# Patient Record
Sex: Male | Born: 1945
Health system: Southern US, Community
[De-identification: ages and names within clinical notes are randomized; demographics above are authoritative.]

## PROBLEM LIST (undated history)

## (undated) DIAGNOSIS — K219 Gastro-esophageal reflux disease without esophagitis: Secondary | ICD-10-CM

## (undated) DIAGNOSIS — N329 Bladder disorder, unspecified: Secondary | ICD-10-CM

## (undated) DIAGNOSIS — E119 Type 2 diabetes mellitus without complications: Secondary | ICD-10-CM

## (undated) DIAGNOSIS — Z973 Presence of spectacles and contact lenses: Secondary | ICD-10-CM

## (undated) DIAGNOSIS — Z8719 Personal history of other diseases of the digestive system: Secondary | ICD-10-CM

## (undated) DIAGNOSIS — M1990 Unspecified osteoarthritis, unspecified site: Secondary | ICD-10-CM

## (undated) DIAGNOSIS — R319 Hematuria, unspecified: Secondary | ICD-10-CM

## (undated) DIAGNOSIS — Z9289 Personal history of other medical treatment: Secondary | ICD-10-CM

## (undated) DIAGNOSIS — I10 Essential (primary) hypertension: Secondary | ICD-10-CM

## (undated) DIAGNOSIS — C801 Malignant (primary) neoplasm, unspecified: Secondary | ICD-10-CM

## (undated) DIAGNOSIS — D649 Anemia, unspecified: Secondary | ICD-10-CM

## (undated) HISTORY — PX: HERNIA REPAIR: SHX51

---

## 2011-03-12 DIAGNOSIS — I10 Essential (primary) hypertension: Secondary | ICD-10-CM | POA: Diagnosis not present

## 2011-03-12 DIAGNOSIS — E119 Type 2 diabetes mellitus without complications: Secondary | ICD-10-CM | POA: Diagnosis not present

## 2011-03-12 DIAGNOSIS — E782 Mixed hyperlipidemia: Secondary | ICD-10-CM | POA: Diagnosis not present

## 2011-03-12 DIAGNOSIS — N400 Enlarged prostate without lower urinary tract symptoms: Secondary | ICD-10-CM | POA: Diagnosis not present

## 2011-03-12 DIAGNOSIS — N529 Male erectile dysfunction, unspecified: Secondary | ICD-10-CM | POA: Diagnosis not present

## 2011-03-16 DIAGNOSIS — R972 Elevated prostate specific antigen [PSA]: Secondary | ICD-10-CM | POA: Diagnosis not present

## 2011-03-16 DIAGNOSIS — E119 Type 2 diabetes mellitus without complications: Secondary | ICD-10-CM | POA: Diagnosis not present

## 2011-03-16 DIAGNOSIS — I10 Essential (primary) hypertension: Secondary | ICD-10-CM | POA: Diagnosis not present

## 2011-03-16 DIAGNOSIS — E782 Mixed hyperlipidemia: Secondary | ICD-10-CM | POA: Diagnosis not present

## 2011-03-16 DIAGNOSIS — N529 Male erectile dysfunction, unspecified: Secondary | ICD-10-CM | POA: Diagnosis not present

## 2011-03-30 DIAGNOSIS — N529 Male erectile dysfunction, unspecified: Secondary | ICD-10-CM | POA: Diagnosis not present

## 2011-03-30 DIAGNOSIS — R972 Elevated prostate specific antigen [PSA]: Secondary | ICD-10-CM | POA: Diagnosis not present

## 2011-04-27 DIAGNOSIS — N529 Male erectile dysfunction, unspecified: Secondary | ICD-10-CM | POA: Diagnosis not present

## 2011-05-28 DIAGNOSIS — R972 Elevated prostate specific antigen [PSA]: Secondary | ICD-10-CM | POA: Diagnosis not present

## 2011-05-28 DIAGNOSIS — N529 Male erectile dysfunction, unspecified: Secondary | ICD-10-CM | POA: Diagnosis not present

## 2011-06-27 DIAGNOSIS — N529 Male erectile dysfunction, unspecified: Secondary | ICD-10-CM | POA: Diagnosis not present

## 2011-08-23 DIAGNOSIS — N529 Male erectile dysfunction, unspecified: Secondary | ICD-10-CM | POA: Diagnosis not present

## 2011-08-27 DIAGNOSIS — E119 Type 2 diabetes mellitus without complications: Secondary | ICD-10-CM | POA: Diagnosis not present

## 2011-08-27 DIAGNOSIS — N529 Male erectile dysfunction, unspecified: Secondary | ICD-10-CM | POA: Diagnosis not present

## 2011-08-27 DIAGNOSIS — I10 Essential (primary) hypertension: Secondary | ICD-10-CM | POA: Diagnosis not present

## 2011-08-27 DIAGNOSIS — E782 Mixed hyperlipidemia: Secondary | ICD-10-CM | POA: Diagnosis not present

## 2011-08-27 DIAGNOSIS — N400 Enlarged prostate without lower urinary tract symptoms: Secondary | ICD-10-CM | POA: Diagnosis not present

## 2011-09-03 DIAGNOSIS — N529 Male erectile dysfunction, unspecified: Secondary | ICD-10-CM | POA: Diagnosis not present

## 2011-09-03 DIAGNOSIS — E119 Type 2 diabetes mellitus without complications: Secondary | ICD-10-CM | POA: Diagnosis not present

## 2011-09-03 DIAGNOSIS — I10 Essential (primary) hypertension: Secondary | ICD-10-CM | POA: Diagnosis not present

## 2011-09-03 DIAGNOSIS — K5730 Diverticulosis of large intestine without perforation or abscess without bleeding: Secondary | ICD-10-CM | POA: Diagnosis not present

## 2011-09-03 DIAGNOSIS — R972 Elevated prostate specific antigen [PSA]: Secondary | ICD-10-CM | POA: Diagnosis not present

## 2011-09-03 DIAGNOSIS — E782 Mixed hyperlipidemia: Secondary | ICD-10-CM | POA: Diagnosis not present

## 2011-10-10 DIAGNOSIS — L259 Unspecified contact dermatitis, unspecified cause: Secondary | ICD-10-CM | POA: Diagnosis not present

## 2012-03-04 DIAGNOSIS — E7800 Pure hypercholesterolemia, unspecified: Secondary | ICD-10-CM | POA: Diagnosis not present

## 2012-03-04 DIAGNOSIS — E119 Type 2 diabetes mellitus without complications: Secondary | ICD-10-CM | POA: Diagnosis not present

## 2012-03-04 DIAGNOSIS — N529 Male erectile dysfunction, unspecified: Secondary | ICD-10-CM | POA: Diagnosis not present

## 2012-03-04 DIAGNOSIS — N400 Enlarged prostate without lower urinary tract symptoms: Secondary | ICD-10-CM | POA: Diagnosis not present

## 2012-03-04 DIAGNOSIS — I10 Essential (primary) hypertension: Secondary | ICD-10-CM | POA: Diagnosis not present

## 2012-04-25 ENCOUNTER — Other Ambulatory Visit: Payer: Self-pay | Admitting: Urology

## 2012-04-25 ENCOUNTER — Ambulatory Visit (INDEPENDENT_AMBULATORY_CARE_PROVIDER_SITE_OTHER): Payer: Medicare Other | Admitting: Urology

## 2012-04-25 DIAGNOSIS — R310 Gross hematuria: Secondary | ICD-10-CM

## 2012-04-25 DIAGNOSIS — R3915 Urgency of urination: Secondary | ICD-10-CM

## 2012-04-25 DIAGNOSIS — R972 Elevated prostate specific antigen [PSA]: Secondary | ICD-10-CM | POA: Diagnosis not present

## 2012-04-30 ENCOUNTER — Ambulatory Visit (HOSPITAL_COMMUNITY)
Admission: RE | Admit: 2012-04-30 | Discharge: 2012-04-30 | Disposition: A | Discharge status: Home / Self Care | Payer: Medicare Other | Source: Ambulatory Visit | Attending: Urology | Admitting: Urology

## 2012-04-30 DIAGNOSIS — E119 Type 2 diabetes mellitus without complications: Secondary | ICD-10-CM | POA: Insufficient documentation

## 2012-04-30 DIAGNOSIS — R310 Gross hematuria: Secondary | ICD-10-CM

## 2012-04-30 LAB — POCT I-STAT, CHEM 8
BUN: 21 mg/dL (ref 6–23)
Calcium, Ion: 1.13 mmol/L (ref 1.13–1.30)
Chloride: 105 mEq/L (ref 96–112)
Creatinine, Ser: 1.1 mg/dL (ref 0.50–1.35)
Sodium: 135 mEq/L (ref 135–145)
TCO2: 28 mmol/L (ref 0–100)

## 2012-04-30 MED ORDER — IOHEXOL 300 MG/ML  SOLN
150.0000 mL | Freq: Once | INTRAMUSCULAR | Status: AC | PRN
  Administered 2012-04-30: 125 mL via INTRAVENOUS

## 2012-04-30 NOTE — Progress Notes (Signed)
Blood sample obtained from left arm IV for Creatnine level.  

## 2012-06-04 DIAGNOSIS — R310 Gross hematuria: Secondary | ICD-10-CM | POA: Diagnosis not present

## 2012-06-04 DIAGNOSIS — R82998 Other abnormal findings in urine: Secondary | ICD-10-CM | POA: Diagnosis not present

## 2012-06-04 DIAGNOSIS — R972 Elevated prostate specific antigen [PSA]: Secondary | ICD-10-CM | POA: Diagnosis not present

## 2012-06-05 ENCOUNTER — Other Ambulatory Visit: Payer: Self-pay | Admitting: Urology

## 2012-06-05 ENCOUNTER — Encounter (HOSPITAL_BASED_OUTPATIENT_CLINIC_OR_DEPARTMENT_OTHER): Payer: Self-pay | Admitting: *Deleted

## 2012-06-10 ENCOUNTER — Other Ambulatory Visit: Payer: Self-pay | Admitting: Urology

## 2012-06-10 ENCOUNTER — Encounter (HOSPITAL_COMMUNITY): Payer: Self-pay | Admitting: Pharmacy Technician

## 2012-06-10 NOTE — Patient Instructions (Addendum)
20 Izrael Peak  06/10/2012   Your procedure is scheduled on:  06/12/12  THURSDAY  Report to [facility] at  0530     AM.  Call this number if you have problems the morning of surgery: [phone]       Remember:   Do not eat food  Or drink :After Midnight. TONIGHT   Take these medicines the morning of surgery with A SIP OF WATER:  NONE   .  Contacts, dentures or partial plates can not be worn to surgery  Leave suitcase in the car. After surgery it may be brought to your room.  For patients admitted to the hospital, checkout time is 11:00 AM day of  discharge.             SPECIAL INSTRUCTIONS- SEE [place] PREPARING FOR SURGERY INSTRUCTION SHEET-     DO NOT WEAR JEWELRY, LOTIONS, POWDERS, OR PERFUMES.  WOMEN-- DO NOT SHAVE LEGS OR UNDERARMS FOR 12 HOURS BEFORE SHOWERS. MEN MAY SHAVE FACE.  Patients discharged the day of surgery will not be allowed to drive home. IF going home the day of surgery, you must have a driver and someone to stay with you for the first 24 hours  Name and phone number of your driver:     Wife  Or friend                                                                   Please read over the following fact sheets that you were given: MRSA Information, Incentive Spirometry Sheet, Blood Transfusion Sheet  Information                                                                                   Jenny Lai  PST 336  [phone]                 FAILURE TO FOLLOW THESE INSTRUCTIONS MAY RESULT IN  CANCELLATION   OF YOUR SURGERY                                                  Patient Signature _____________________________

## 2012-06-11 ENCOUNTER — Ambulatory Visit (HOSPITAL_COMMUNITY)
Admission: RE | Admit: 2012-06-11 | Discharge: 2012-06-11 | Disposition: A | Discharge status: Home / Self Care | Payer: Medicare Other | Source: Ambulatory Visit | Attending: Urology | Admitting: Urology

## 2012-06-11 ENCOUNTER — Encounter (HOSPITAL_COMMUNITY)
Admission: RE | Admit: 2012-06-11 | Discharge: 2012-06-11 | Disposition: A | Discharge status: Home / Self Care | Payer: Medicare Other | Source: Ambulatory Visit | Attending: Urology | Admitting: Urology

## 2012-06-11 ENCOUNTER — Encounter (HOSPITAL_COMMUNITY): Payer: Self-pay

## 2012-06-11 DIAGNOSIS — N3020 Other chronic cystitis without hematuria: Secondary | ICD-10-CM | POA: Diagnosis not present

## 2012-06-11 DIAGNOSIS — E7800 Pure hypercholesterolemia, unspecified: Secondary | ICD-10-CM | POA: Diagnosis not present

## 2012-06-11 DIAGNOSIS — Z01818 Encounter for other preprocedural examination: Secondary | ICD-10-CM | POA: Diagnosis not present

## 2012-06-11 DIAGNOSIS — I10 Essential (primary) hypertension: Secondary | ICD-10-CM | POA: Diagnosis not present

## 2012-06-11 DIAGNOSIS — E119 Type 2 diabetes mellitus without complications: Secondary | ICD-10-CM | POA: Diagnosis not present

## 2012-06-11 DIAGNOSIS — Z87891 Personal history of nicotine dependence: Secondary | ICD-10-CM | POA: Diagnosis not present

## 2012-06-11 DIAGNOSIS — N529 Male erectile dysfunction, unspecified: Secondary | ICD-10-CM | POA: Diagnosis not present

## 2012-06-11 DIAGNOSIS — N329 Bladder disorder, unspecified: Secondary | ICD-10-CM | POA: Diagnosis not present

## 2012-06-11 DIAGNOSIS — Z79899 Other long term (current) drug therapy: Secondary | ICD-10-CM | POA: Diagnosis not present

## 2012-06-11 DIAGNOSIS — Z7982 Long term (current) use of aspirin: Secondary | ICD-10-CM | POA: Diagnosis not present

## 2012-06-11 DIAGNOSIS — C61 Malignant neoplasm of prostate: Secondary | ICD-10-CM | POA: Diagnosis not present

## 2012-06-11 DIAGNOSIS — Z8711 Personal history of peptic ulcer disease: Secondary | ICD-10-CM | POA: Diagnosis not present

## 2012-06-11 HISTORY — DX: Hematuria, unspecified: R31.9

## 2012-06-11 HISTORY — DX: Anemia, unspecified: D64.9

## 2012-06-11 HISTORY — DX: Type 2 diabetes mellitus without complications: E11.9

## 2012-06-11 HISTORY — DX: Essential (primary) hypertension: I10

## 2012-06-11 HISTORY — DX: Personal history of other diseases of the digestive system: Z87.19

## 2012-06-11 HISTORY — DX: Gastro-esophageal reflux disease without esophagitis: K21.9

## 2012-06-11 HISTORY — DX: Personal history of other medical treatment: Z92.89

## 2012-06-11 LAB — CBC
HCT: 45.3 % (ref 39.0–52.0)
Hemoglobin: 14.3 g/dL (ref 13.0–17.0)
MCH: 26.9 pg (ref 26.0–34.0)
MCHC: 31.6 g/dL (ref 30.0–36.0)
MCV: 85.3 fL (ref 78.0–100.0)
Platelets: 207 10*3/uL (ref 150–400)
RBC: 5.31 MIL/uL (ref 4.22–5.81)
RDW: 13.5 % (ref 11.5–15.5)
WBC: 4.8 10*3/uL (ref 4.0–10.5)

## 2012-06-11 LAB — BASIC METABOLIC PANEL
GFR calc Af Amer: >90 mL/min (ref >90)
GFR calc non Af Amer: 84 mL/min — ABNORMAL LOW (ref >90)
Glucose, Bld: 106 mg/dL — ABNORMAL HIGH (ref 70–99)
Potassium: 3.8 mEq/L (ref 3.5–5.1)
Sodium: 139 mEq/L (ref 135–145)

## 2012-06-11 LAB — SURGICAL PCR SCREEN
MRSA, PCR: NEGATIVE
Staphylococcus aureus: NEGATIVE

## 2012-06-11 NOTE — Progress Notes (Signed)
06/11/12 0913  OBSTRUCTIVE SLEEP APNEA  Have you ever been diagnosed with sleep apnea through a sleep study? No  Do you snore loudly (loud enough to be heard through closed doors)?  0  Do you often feel tired, fatigued, or sleepy during the daytime? 0  Has anyone observed you stop breathing during your sleep? 0  Do you have, or are you being treated for high blood pressure? 1  BMI more than 35 kg/m2? 1  Age over 68 years old? 1  Neck circumference greater than 40 cm/18 inches? 0  Gender: 1  Obstructive Sleep Apnea Score 4  Score 4 or greater  Results sent to PCP

## 2012-06-11 NOTE — H&P (Signed)
oblems Problems  1. Feelings Of Urinary Urgency 788.63 2. Gross Hematuria 599.71 3. Organic Impotence 607.84 4. PSA,Elevated 790.93  History of Present Illness  Mr. Christianson returns today in f/u for his elevated PSA and gross hematuria.   His CT showed no urologic issues.   Past Medical History Problems  1. History of  Arthritis V13.4 2. History of  Diabetes Mellitus 250.00 3. History of  Hypercholesterolemia 272.0 4. History of  Hypertension 401.9 5. History of  Peptic Ulcer V12.71  Surgical History Problems  1. History of  Reported Hx Of Surgery For An Ulcer  Current Meds 1. Aspirin 81 MG Oral Tablet Chewable; Therapy: (Recorded:28Feb2014) to 2. Hydrochlorothiazide 25 MG Oral Tablet; Therapy: (Recorded:28Feb2014) to 3. Levofloxacin 500 MG Oral Tablet; Take one tablet daily starting day before procedure; Therapy:  28Feb2014 to (Evaluate:03Mar2014)  Requested for: 28Feb2014; Last Rx:28Feb2014 4. MetFORMIN HCl ER 500 MG Oral Tablet Extended Release 24 Hour; Therapy:  (Recorded:28Feb2014) to 5. Simvastatin 10 MG Oral Tablet; Therapy: (Recorded:28Feb2014) to  Allergies Medication  1. No Known Drug Allergies  Family History Problems  1. Family history of  Death In The Family Father age 42 ? 2. Family history of  Death In The Family Mother age 69 3. Family history of  Family Health Status Number Of Children 2 sons 2 daughters  Social History Problems  1. Caffeine Use 2 p/d 2. Former Smoker V15.82 1 pk. p/d for 35 yrs. quit 25 yrs. 3. Marital History - Currently Married 4. Never Drank Alcohol 5. Occupation: Retired  Research scientist (life sciences) Vital Signs [Data Includes: Last 1 Day]  09Apr2014 10:39AM  Blood Pressure: 142 / 82 Temperature: 98.4 F Heart Rate: 59  Results/Data Urine [Data Includes: Last 1 Day]   09Apr2014  COLOR YELLOW   APPEARANCE CLEAR   SPECIFIC GRAVITY 1.025   pH 5.5   GLUCOSE NEG mg/dL  BILIRUBIN NEG   KETONE NEG mg/dL  BLOOD SMALL   PROTEIN NEG  mg/dL  UROBILINOGEN 0.2 mg/dL  NITRITE NEG   LEUKOCYTE ESTERASE NEG   SQUAMOUS EPITHELIAL/HPF FEW   WBC NONE SEEN WBC/hpf  RBC 3-6 RBC/hpf  BACTERIA NONE SEEN   CRYSTALS NONE SEEN   CASTS NONE SEEN    The following images/tracing/specimen were independently visualized:  I have reviewed his CT films and report.    Procedure  Procedure: Cystoscopy   Indication: Hematuria.  Informed Consent: Risks, benefits, and potential adverse events were discussed and informed consent was obtained from the patient.  Prep: The patient was prepped with betadine.  Antibiotic prophylaxis: Levofloxacin.  Procedure Note:  Urethral meatus:. No abnormalities.  Anterior urethra: No abnormalities.  Prostatic urethra: No abnormalities . Estimated length was 2 cm. The lateral prostatic lobes were enlarged. No intravesical median lobe was visualized.  Bladder: Visulization was clear. The ureteral orifices were in the normal anatomic position bilaterally and had clear efflux of urine. A systematic survey of the bladder demonstrated no bladder tumors or stones. Examination of the bladder demonstrated mild trabeculation erythematous mucosa (weblike on the posterior wall with a small area that may have papillary fronds or capillary buds. Worrisome for CIS vs inflammation. ). The patient tolerated the procedure well.  Complications: None.   Procedure: Transrectal ultrasound of the prostate, Prostate needle biopsy.  Indication: Elevated PSA.  Consent: Risks, benefits, and potential adverse events were discussed and informed consent was obtained from the patient.  Prep: Fleets enema.  Local Anesthesia: A periprostatic nerve block was performed with 10 cc of 2% Xylocaine  Antibiotic prophylaxis: Levofloxacin  Procedure Note:  The patient was placed in the lateral decubitus position. The transrectal ultrasound probe was placed into the rectum. Prostate width measured 4.47cm, height of 3.13cm and length of 4.80cm .  Prostate volume was measured to be 35 cc. Hypoechoic lesions were noted (one cm in lateral PZ) (right base) . Calcifications were noted. (scant) . The seminal vesicles appeared normal. No median lobe was present. The biopsy cores were obtained utilizing a standard 12-core pattern with one core from the right apex lateral, right apex medial, left apex lateral, left apex medial, right mid lateral, right mid medial, left mid lateral, left mid medial, right base lateral, right base medial, left base lateral and left base medial of the prostate and one additional core at the right base. The biopsy cores were placed in buffered formalin and sent to pathlogy.  Patient Status:. The patient tolerated the procedure well.  Complications:. None.    Assessment Assessed  1. PSA,Elevated 790.93 2. Gross Hematuria 599.71   He has worrisome erythema in the bladder that could be CIS. He has a hypoechoic lesion in the prostate that is worrisome for prostate cancer.   Plan  Gross Hematuria (599.71)  1. Follow-up Schedule Surgery Office  Follow-up  Requested for: 09Apr2014 Health Maintenance (V70.0)  2. UA With REFLEX  Done: 09Apr2014 10:21AM   I am going to get him set up for an outpatient cystoscopy with bladder biopsy and fulguration and reviewed the risks of bleeding, infection, bladder injury, thrombotic events and anesthetic complications. I will call him with his prostate biopsy results. Urine cytology sent today.     URINE CYTOLOGY W/REFLEX FISH  Status: Hold For - Specimen/Data Collection,Appointment  Requested for: 09Apr2014 Ordered Today; For: Gross Hematuria (599.71); Ordered By: Elliot Gault  Due: 14Apr2014   Discussion/Summary   CC: Dr. Margot Ables.

## 2012-06-12 ENCOUNTER — Ambulatory Visit (HOSPITAL_COMMUNITY): Payer: Medicare Other | Admitting: Anesthesiology

## 2012-06-12 ENCOUNTER — Encounter (HOSPITAL_COMMUNITY): Payer: Self-pay | Admitting: Anesthesiology

## 2012-06-12 ENCOUNTER — Encounter (HOSPITAL_COMMUNITY)
Admission: RE | Disposition: A | Discharge status: Home / Self Care | Payer: Self-pay | Source: Ambulatory Visit | Attending: Urology

## 2012-06-12 ENCOUNTER — Encounter (HOSPITAL_COMMUNITY): Payer: Self-pay

## 2012-06-12 ENCOUNTER — Ambulatory Visit (HOSPITAL_BASED_OUTPATIENT_CLINIC_OR_DEPARTMENT_OTHER)
Admission: RE | Admit: 2012-06-12 | Discharge: 2012-06-12 | Disposition: A | Discharge status: Home / Self Care | Payer: Medicare Other | Source: Ambulatory Visit | Attending: Urology | Admitting: Urology

## 2012-06-12 DIAGNOSIS — E7800 Pure hypercholesterolemia, unspecified: Secondary | ICD-10-CM | POA: Insufficient documentation

## 2012-06-12 DIAGNOSIS — Z79899 Other long term (current) drug therapy: Secondary | ICD-10-CM | POA: Insufficient documentation

## 2012-06-12 DIAGNOSIS — N529 Male erectile dysfunction, unspecified: Secondary | ICD-10-CM | POA: Insufficient documentation

## 2012-06-12 DIAGNOSIS — E119 Type 2 diabetes mellitus without complications: Secondary | ICD-10-CM | POA: Insufficient documentation

## 2012-06-12 DIAGNOSIS — C61 Malignant neoplasm of prostate: Secondary | ICD-10-CM | POA: Insufficient documentation

## 2012-06-12 DIAGNOSIS — I10 Essential (primary) hypertension: Secondary | ICD-10-CM | POA: Insufficient documentation

## 2012-06-12 DIAGNOSIS — R310 Gross hematuria: Secondary | ICD-10-CM | POA: Diagnosis not present

## 2012-06-12 DIAGNOSIS — N3020 Other chronic cystitis without hematuria: Secondary | ICD-10-CM | POA: Diagnosis not present

## 2012-06-12 DIAGNOSIS — N329 Bladder disorder, unspecified: Secondary | ICD-10-CM | POA: Diagnosis not present

## 2012-06-12 DIAGNOSIS — Z7982 Long term (current) use of aspirin: Secondary | ICD-10-CM | POA: Insufficient documentation

## 2012-06-12 DIAGNOSIS — K219 Gastro-esophageal reflux disease without esophagitis: Secondary | ICD-10-CM | POA: Diagnosis not present

## 2012-06-12 DIAGNOSIS — Z87891 Personal history of nicotine dependence: Secondary | ICD-10-CM | POA: Insufficient documentation

## 2012-06-12 DIAGNOSIS — Z8711 Personal history of peptic ulcer disease: Secondary | ICD-10-CM | POA: Insufficient documentation

## 2012-06-12 HISTORY — DX: Bladder disorder, unspecified: N32.9

## 2012-06-12 HISTORY — PX: CYSTOSCOPY WITH BIOPSY: SHX5122

## 2012-06-12 SURGERY — CYSTOSCOPY, WITH BIOPSY
Anesthesia: General | Wound class: Clean Contaminated

## 2012-06-12 SURGERY — CYSTOSCOPY, WITH BIOPSY
Anesthesia: Choice | Site: Bladder

## 2012-06-12 MED ORDER — ACETAMINOPHEN 10 MG/ML IV SOLN
INTRAVENOUS | Status: AC
  Filled 2012-06-12: qty 100

## 2012-06-12 MED ORDER — SODIUM CHLORIDE 0.9 % IJ SOLN: 3.0000 mL | Freq: Two times a day (BID) | INTRAMUSCULAR | Status: DC

## 2012-06-12 MED ORDER — OXYCODONE HCL 5 MG PO TABS: 5.0000 mg | ORAL_TABLET | ORAL | Status: DC | PRN

## 2012-06-12 MED ORDER — CIPROFLOXACIN IN D5W 400 MG/200ML IV SOLN
400.0000 mg | INTRAVENOUS | Status: AC
  Administered 2012-06-12: 400 mg via INTRAVENOUS

## 2012-06-12 MED ORDER — METOCLOPRAMIDE HCL 5 MG/ML IJ SOLN
INTRAMUSCULAR | Status: DC | PRN
  Administered 2012-06-12: 10 mg via INTRAVENOUS

## 2012-06-12 MED ORDER — PROPOFOL 10 MG/ML IV BOLUS
INTRAVENOUS | Status: DC | PRN
  Administered 2012-06-12: 200 mg via INTRAVENOUS

## 2012-06-12 MED ORDER — SODIUM CHLORIDE 0.9 % IJ SOLN: 3.0000 mL | INTRAMUSCULAR | Status: DC | PRN

## 2012-06-12 MED ORDER — MIDAZOLAM HCL 5 MG/5ML IJ SOLN
INTRAMUSCULAR | Status: DC | PRN
  Administered 2012-06-12: 2 mg via INTRAVENOUS

## 2012-06-12 MED ORDER — DEXAMETHASONE SODIUM PHOSPHATE 4 MG/ML IJ SOLN
INTRAMUSCULAR | Status: DC | PRN
  Administered 2012-06-12: 10 mg via INTRAVENOUS

## 2012-06-12 MED ORDER — FENTANYL CITRATE 0.05 MG/ML IJ SOLN: 25.0000 ug | INTRAMUSCULAR | Status: DC | PRN

## 2012-06-12 MED ORDER — PHENAZOPYRIDINE HCL 200 MG PO TABS: 200.0000 mg | ORAL_TABLET | Freq: Three times a day (TID) | ORAL | Status: DC | PRN

## 2012-06-12 MED ORDER — KETAMINE HCL 10 MG/ML IJ SOLN
INTRAMUSCULAR | Status: DC | PRN
  Administered 2012-06-12 (×2): 10 mg via INTRAVENOUS

## 2012-06-12 MED ORDER — SODIUM CHLORIDE 0.9 % IV SOLN: 250.0000 mL | INTRAVENOUS | Status: DC | PRN

## 2012-06-12 MED ORDER — LACTATED RINGERS IV SOLN
1000.0000 mL | INTRAVENOUS | Status: DC
  Administered 2012-06-12: 1000 mL via INTRAVENOUS

## 2012-06-12 MED ORDER — ACETAMINOPHEN 325 MG PO TABS: 650.0000 mg | ORAL_TABLET | ORAL | Status: DC | PRN

## 2012-06-12 MED ORDER — CIPROFLOXACIN IN D5W 400 MG/200ML IV SOLN
INTRAVENOUS | Status: AC
  Filled 2012-06-12: qty 200

## 2012-06-12 MED ORDER — PHENYLEPHRINE HCL 10 MG/ML IJ SOLN
INTRAMUSCULAR | Status: DC | PRN
  Administered 2012-06-12: 40 ug via INTRAVENOUS

## 2012-06-12 MED ORDER — FENTANYL CITRATE 0.05 MG/ML IJ SOLN
INTRAMUSCULAR | Status: DC | PRN
  Administered 2012-06-12 (×4): 25 ug via INTRAVENOUS

## 2012-06-12 MED ORDER — PROMETHAZINE HCL 25 MG/ML IJ SOLN: 6.2500 mg | INTRAMUSCULAR | Status: DC | PRN

## 2012-06-12 MED ORDER — LACTATED RINGERS IV SOLN
INTRAVENOUS | Status: DC | PRN
  Administered 2012-06-12: 07:00:00 via INTRAVENOUS

## 2012-06-12 MED ORDER — ACETAMINOPHEN 650 MG RE SUPP
650.0000 mg | RECTAL | Status: DC | PRN
  Filled 2012-06-12: qty 1

## 2012-06-12 MED ORDER — HYDROCODONE-ACETAMINOPHEN 5-325 MG PO TABS: 1.0000 | ORAL_TABLET | Freq: Four times a day (QID) | ORAL | Status: DC | PRN

## 2012-06-12 MED ORDER — ACETAMINOPHEN 10 MG/ML IV SOLN
INTRAVENOUS | Status: DC | PRN
  Administered 2012-06-12: 1000 mg via INTRAVENOUS

## 2012-06-12 MED ORDER — ONDANSETRON HCL 4 MG/2ML IJ SOLN
INTRAMUSCULAR | Status: DC | PRN
  Administered 2012-06-12: 4 mg via INTRAVENOUS

## 2012-06-12 MED ORDER — ONDANSETRON HCL 4 MG/2ML IJ SOLN: 4.0000 mg | Freq: Four times a day (QID) | INTRAMUSCULAR | Status: DC | PRN

## 2012-06-12 MED ORDER — STERILE WATER FOR IRRIGATION IR SOLN
Status: DC | PRN
  Administered 2012-06-12: 3000 mL

## 2012-06-12 SURGICAL SUPPLY — 8 items
BAG URO CATCHER STRL LF (DRAPE) ×2 IMPLANT
CLOTH BEACON ORANGE TIMEOUT ST (SAFETY) ×2 IMPLANT
DRAPE CAMERA CLOSED 9X96 (DRAPES) ×4 IMPLANT
GLOVE SURG SS PI 8.0 STRL IVOR (GLOVE) ×2 IMPLANT
GOWN PREVENTION PLUS XLARGE (GOWN DISPOSABLE) ×4 IMPLANT
GOWN STRL REIN XL XLG (GOWN DISPOSABLE) ×2 IMPLANT
MANIFOLD NEPTUNE II (INSTRUMENTS) ×2 IMPLANT
TUBING CONNECTING 10 (TUBING) ×2 IMPLANT

## 2012-06-12 NOTE — Transfer of Care (Signed)
Immediate Anesthesia Transfer of Care Note  Patient: Robert Bishop  Procedure(s) Performed: Procedure(s): CYSTOSCOPY BLADDER BIOPSY WITH FULGURATION (N/A)  Patient Location: PACU  Anesthesia Type:General  Level of Consciousness: awake and responds to stimulation, drowsy, ventilating well, easily awoken.  Airway & Oxygen Therapy: Patient Spontanous Breathing and Patient connected to face mask oxygen  Post-op Assessment: Report given to PACU RN, Post -op Vital signs reviewed and stable and Patient moving all extremities X 4  Post vital signs: Reviewed and stable  Complications: No apparent anesthesia complications

## 2012-06-12 NOTE — Progress Notes (Addendum)
Pt stayed in short stay post-op for 1 hour as ordered.   Arrived in short stay at 0952, left unit at 1110.

## 2012-06-12 NOTE — Op Note (Signed)
NAMEJMICHAEL, GILLE NO.:  [id number]  MEDICAL RECORD NO.:  [id number]  LOCATION:  [facility]                         FACILITY:  [facility]  PHYSICIAN:  Excell Seltzer. Annabell Howells, M.D.    DATE OF BIRTH:  20-Dec-1945  DATE OF PROCEDURE:  06/12/2012 DATE OF DISCHARGE:                              OPERATIVE REPORT   PROCEDURE:  Cystoscopy with bladder biopsy and fulguration with multiple lesions.  PREOPERATIVE DIAGNOSIS:  Bladder wall lesion.  POSTOPERATIVE DIAGNOSIS:  Bladder wall lesion with approximately 10 lesions.  SURGEON:  Calistro Rauf. Annabell Howells, M.D.  ANESTHESIA:  General.  SPECIMENS:  Bladder biopsies from left posterior, mid posterior, and base of the bladder.  ESTIMATED BLOOD LOSS:  None.  DRAINS:  None.  COMPLICATIONS:  None.  INDICATIONS:  Mr. Sotomayor is a 67 year old, African American male, who was sent in evaluation for hematuria and elevated PSA.  During his evaluation, he was found to have multiple lesions on the posterior wall of the bladder with a pinkish-yellow appearance.  It was felt the biopsy was indicated to rule out neoplasm.  He also has had a prostate biopsy that did show a prostate cancer.  FINDINGS AND PROCEDURE:  He was given Cipro and taken to the operating room where general anesthetic was induced.  He was fitted with PAS hose and placed in lithotomy position.  His perineum and genitalia were prepped with Betadine solution.  He was draped in the usual sterile fashion.  Cystoscopy was performed using a 22-French scope and 12 and 70 degree lenses.  Examination revealed a normal urethra.  The external sphincter was intact.  The prostatic urethra was approximately 2 cm in length with bilobar hyperplasia with mild obstruction.  Examination of the bladder revealed mild trabeculation.  Ureteral orifices were in their normal anatomic position effluxing clear urine.  On the posterior wall and base of the bladder extending into the trigone, there were  multiple yellowish- pink flat lesions that extended from the right lateral wall to the left lateral wall on the posterior bladder surface.  Biopsies were obtained with a cup biopsy forceps from the left posterior, mid posterior, and the base of the bladder.  Once the biopsies had been obtained, a Bugbee electrode was used to fulgurate the biopsy sites to provide hemostasis.  I then fulgurated all the remaining lesions that could be identified; there were approximately 10, ranging in size from 3 mm to approximately 2 cm.  Once all the lesions had been fulgurated, inspection revealed no active bleeding.  The bladder was then drained.  The scope was removed.  The patient was taken down from lithotomy position.  His anesthetic was reversed.  He was moved to recovery room in stable condition.  There were no complications.     Excell Seltzer. Annabell Howells, M.D.     JJW/MEDQ  D:  06/12/2012  T:  06/12/2012  Job:  [id number]

## 2012-06-12 NOTE — Interval H&P Note (Signed)
History and Physical Interval Note:  06/12/2012 7:24 AM  Robert Bishop  has presented today for surgery, with the diagnosis of BLADDER LESION  The various methods of treatment have been discussed with the patient and family. After consideration of risks, benefits and other options for treatment, the patient has consented to  Procedure(s): CYSTOSCOPY BLADDER BIOPSY WITH FULGURATION (N/A) as a surgical intervention .  The patient's history has been reviewed, patient examined, no change in status, stable for surgery.  I have reviewed the patient's chart and labs.  Questions were answered to the patient's satisfaction.     Mi Balla,[name]

## 2012-06-12 NOTE — Anesthesia Postprocedure Evaluation (Signed)
  Anesthesia Post-op Note  Patient: Robert Bishop  Procedure(s) Performed: Procedure(s) (LRB): CYSTOSCOPY BLADDER BIOPSY WITH FULGURATION (N/A)  Patient Location: PACU  Anesthesia Type: General  Level of Consciousness: awake and alert   Airway and Oxygen Therapy: Patient Spontanous Breathing  Post-op Pain: mild  Post-op Assessment: Post-op Vital signs reviewed, Patient's Cardiovascular Status Stable, Respiratory Function Stable, Patent Airway and No signs of Nausea or vomiting  Last Vitals:  Filed Vitals:   06/12/12 0915  BP: 133/81  Pulse: 76  Temp:   Resp: 18    Post-op Vital Signs: stable   Complications: No apparent anesthesia complications. No signs of sleep apnea.

## 2012-06-12 NOTE — Progress Notes (Signed)
Dr. Council Mechanic in to see patient- O.K. To go to Short Stay now- to stay in Short Stay at least one hour before discharge

## 2012-06-12 NOTE — Anesthesia Preprocedure Evaluation (Signed)
Anesthesia Evaluation  Patient identified by MRN, date of birth, ID band Patient awake    Reviewed: Allergy & Precautions, H&P , NPO status , Patient's Chart, lab work & pertinent test results  Airway Mallampati: II TM Distance: >3 FB Neck ROM: Full    Dental no notable dental hx.    Pulmonary neg pulmonary ROS,  breath sounds clear to auscultation  Pulmonary exam normal       Cardiovascular Exercise Tolerance: Good hypertension, Pt. on medications Rhythm:Regular Rate:Normal     Neuro/Psych negative neurological ROS  negative psych ROS   GI/Hepatic Neg liver ROS, hiatal hernia, GERD-  ,  Endo/Other  diabetes, Type 2, Oral Hypoglycemic Agents  Renal/GU negative Renal ROS  negative genitourinary   Musculoskeletal negative musculoskeletal ROS (+)   Abdominal (+) + obese,   Peds negative pediatric ROS (+)  Hematology negative hematology ROS (+)   Anesthesia Other Findings   Reproductive/Obstetrics negative OB ROS                           Anesthesia Physical Anesthesia Plan  ASA: III  Anesthesia Plan: General   Post-op Pain Management:    Induction: Intravenous  Airway Management Planned: LMA  Additional Equipment:   Intra-op Plan:   Post-operative Plan: Extubation in OR  Informed Consent: I have reviewed the patients History and Physical, chart, labs and discussed the procedure including the risks, benefits and alternatives for the proposed anesthesia with the patient or authorized representative who has indicated his/her understanding and acceptance.   Dental advisory given  Plan Discussed with: CRNA  Anesthesia Plan Comments:         Anesthesia Quick Evaluation

## 2012-06-12 NOTE — Progress Notes (Signed)
Pt has no dressing to assess post surgery.

## 2012-06-12 NOTE — Brief Op Note (Signed)
06/12/2012  8:05 AM  PATIENT:  Robert Bishop  67 y.o. male  PRE-OPERATIVE DIAGNOSIS:  BLADDER LESION  POST-OPERATIVE DIAGNOSIS:  bladder lesion  PROCEDURE:  Procedure(s): CYSTOSCOPY BLADDER BIOPSY of 3 lesions WITH FULGURATION of 10+ lesions (N/A)  SURGEON:  Surgeon(s) and Role:    * Anner Crete, MD - Primary  PHYSICIAN ASSISTANT:   ASSISTANTS: none   ANESTHESIA:   general  EBL:     BLOOD ADMINISTERED:none  DRAINS: none   LOCAL MEDICATIONS USED:  NONE  SPECIMEN:  Source of Specimen:  left posterior, mid posterior and base of bladder biopsies.   DISPOSITION OF SPECIMEN:  PATHOLOGY  COUNTS:  YES  TOURNIQUET:  * No tourniquets in log *  DICTATION: .Other Dictation: Dictation Number [id number]  PLAN OF CARE: Discharge to home after PACU  PATIENT DISPOSITION:  PACU - hemodynamically stable.   Delay start of Pharmacological VTE agent (>24hrs) due to surgical blood loss or risk of bleeding: not applicable

## 2012-06-12 NOTE — Preoperative (Signed)
Beta Blockers   Reason not to administer Beta Blockers:Not Applicable, not on home BB 

## 2012-06-13 ENCOUNTER — Encounter (HOSPITAL_COMMUNITY): Payer: Self-pay | Admitting: Urology

## 2012-07-03 DIAGNOSIS — N529 Male erectile dysfunction, unspecified: Secondary | ICD-10-CM | POA: Diagnosis not present

## 2012-07-03 DIAGNOSIS — C61 Malignant neoplasm of prostate: Secondary | ICD-10-CM | POA: Diagnosis not present

## 2012-07-04 ENCOUNTER — Other Ambulatory Visit: Payer: Self-pay | Admitting: Urology

## 2012-07-15 DIAGNOSIS — R279 Unspecified lack of coordination: Secondary | ICD-10-CM | POA: Diagnosis not present

## 2012-07-15 DIAGNOSIS — C61 Malignant neoplasm of prostate: Secondary | ICD-10-CM | POA: Diagnosis not present

## 2012-07-15 DIAGNOSIS — M6281 Muscle weakness (generalized): Secondary | ICD-10-CM | POA: Diagnosis not present

## 2012-07-18 ENCOUNTER — Encounter (HOSPITAL_COMMUNITY): Payer: Self-pay | Admitting: Pharmacy Technician

## 2012-07-23 DIAGNOSIS — M6281 Muscle weakness (generalized): Secondary | ICD-10-CM | POA: Diagnosis not present

## 2012-07-23 DIAGNOSIS — R279 Unspecified lack of coordination: Secondary | ICD-10-CM | POA: Diagnosis not present

## 2012-07-23 DIAGNOSIS — C61 Malignant neoplasm of prostate: Secondary | ICD-10-CM | POA: Diagnosis not present

## 2012-07-25 NOTE — Patient Instructions (Addendum)
20 Robert Bishop  07/25/2012   Your procedure is scheduled on: 08/06/12  Report to [facility] at 10:00 AM.  Call this number if you have problems the morning of surgery 336-: [phone]   Remember:   Do not eat food or drink liquids After Midnight.     Take these medicines the morning of surgery with A SIP OF WATER: no meds to take   Do not wear jewelry, make-up or nail polish.  Do not wear lotions, powders, or perfumes. You may wear deodorant.  Do not shave 48 hours prior to surgery. Men may shave face and neck.  Do not bring valuables to the hospital.  Contacts, dentures or bridgework may not be worn into surgery.  Leave suitcase in the car. After surgery it may be brought to your room.  For patients admitted to the hospital, checkout time is 11:00 AM the day of discharge.   Special Instructions: Shower using CHG 2 nights before surgery and the night before surgery.  If you shower the day of surgery use CHG.  Use special wash - you have one bottle of CHG for all showers.  You should use approximately 1/3 of the bottle for each shower.   Please read over the following fact sheets that you were given: MRSA Information, blood fact sheet.  Cain Sieve, RN  pre op nurse call if needed [phone]    FAILURE TO FOLLOW THESE INSTRUCTIONS MAY RESULT IN CANCELLATION OF YOUR SURGERY   Patient Signature: ___________________________________________

## 2012-07-25 NOTE — Progress Notes (Signed)
EKG 06/14/12 on EPIC, Chest x-ray 06/11/12 on EPIC

## 2012-07-28 ENCOUNTER — Encounter (HOSPITAL_COMMUNITY): Payer: Self-pay

## 2012-07-28 ENCOUNTER — Encounter (HOSPITAL_COMMUNITY)
Admission: RE | Admit: 2012-07-28 | Discharge: 2012-07-28 | Disposition: A | Discharge status: Home / Self Care | Payer: Medicare Other | Source: Ambulatory Visit | Attending: Urology | Admitting: Urology

## 2012-07-28 DIAGNOSIS — I10 Essential (primary) hypertension: Secondary | ICD-10-CM | POA: Diagnosis present

## 2012-07-28 DIAGNOSIS — Z01812 Encounter for preprocedural laboratory examination: Secondary | ICD-10-CM | POA: Diagnosis not present

## 2012-07-28 DIAGNOSIS — E7800 Pure hypercholesterolemia, unspecified: Secondary | ICD-10-CM | POA: Diagnosis present

## 2012-07-28 DIAGNOSIS — D1739 Benign lipomatous neoplasm of skin and subcutaneous tissue of other sites: Secondary | ICD-10-CM | POA: Diagnosis not present

## 2012-07-28 DIAGNOSIS — E119 Type 2 diabetes mellitus without complications: Secondary | ICD-10-CM | POA: Diagnosis not present

## 2012-07-28 DIAGNOSIS — R310 Gross hematuria: Secondary | ICD-10-CM | POA: Diagnosis present

## 2012-07-28 DIAGNOSIS — M6281 Muscle weakness (generalized): Secondary | ICD-10-CM | POA: Diagnosis not present

## 2012-07-28 DIAGNOSIS — R3915 Urgency of urination: Secondary | ICD-10-CM | POA: Diagnosis not present

## 2012-07-28 DIAGNOSIS — R279 Unspecified lack of coordination: Secondary | ICD-10-CM | POA: Diagnosis not present

## 2012-07-28 DIAGNOSIS — N529 Male erectile dysfunction, unspecified: Secondary | ICD-10-CM | POA: Diagnosis present

## 2012-07-28 DIAGNOSIS — K219 Gastro-esophageal reflux disease without esophagitis: Secondary | ICD-10-CM | POA: Diagnosis not present

## 2012-07-28 DIAGNOSIS — Z87891 Personal history of nicotine dependence: Secondary | ICD-10-CM | POA: Diagnosis not present

## 2012-07-28 DIAGNOSIS — C61 Malignant neoplasm of prostate: Secondary | ICD-10-CM | POA: Diagnosis not present

## 2012-07-28 LAB — BASIC METABOLIC PANEL
CO2: 28 mEq/L (ref 19–32)
GFR calc non Af Amer: 84 mL/min — ABNORMAL LOW (ref >90)
Glucose, Bld: 118 mg/dL — ABNORMAL HIGH (ref 70–99)
Potassium: 4.2 mEq/L (ref 3.5–5.1)
Sodium: 137 mEq/L (ref 135–145)

## 2012-07-28 LAB — CBC
Hemoglobin: 14.9 g/dL (ref 13.0–17.0)
MCH: 27.4 pg (ref 26.0–34.0)
MCV: 84.3 fL (ref 78.0–100.0)
RBC: 5.43 MIL/uL (ref 4.22–5.81)
WBC: 4.9 10*3/uL (ref 4.0–10.5)

## 2012-07-28 LAB — SURGICAL PCR SCREEN: Staphylococcus aureus: NEGATIVE

## 2012-07-30 DIAGNOSIS — R3915 Urgency of urination: Secondary | ICD-10-CM | POA: Diagnosis not present

## 2012-07-30 DIAGNOSIS — M6281 Muscle weakness (generalized): Secondary | ICD-10-CM | POA: Diagnosis not present

## 2012-07-30 DIAGNOSIS — R279 Unspecified lack of coordination: Secondary | ICD-10-CM | POA: Diagnosis not present

## 2012-07-30 DIAGNOSIS — C61 Malignant neoplasm of prostate: Secondary | ICD-10-CM | POA: Diagnosis not present

## 2012-08-04 NOTE — H&P (Signed)
ctive Problems Problems  1. Feelings Of Urinary Urgency 788.63 2. Gross Hematuria 599.71 3. Organic Impotence 607.84 4. Prostate Cancer 185 5. PSA,Elevated 790.93  History of Present Illness  Robert Bishop returns today to discuss his biopsy results.  His bladder biopsy was negative for cancer.  His prostate biopsy showed a Gleason 7(4+3) cancer in the right base laterally in the area of the hypoechoic lesion seen on [place].  A second core from that area had gleason 7(3+4) as did the right mid lateral core.  His prostate volume is 31cc.  His IPSS is 0 and his SHIM is 1.  His [place] score is 2 and he has intermediate risk disease.  A CT done for his hematuria evaluation was negative for mets in the nodes or bones.   He is stage T1c N0 M0(with partial bone imaging).   Past Medical History Problems  1. History of  Arthritis V13.4 2. History of  Diabetes Mellitus 250.00 3. History of  Hypercholesterolemia 272.0 4. History of  Hypertension 401.9 5. History of  Peptic Ulcer V12.71  Surgical History Problems  1. History of  Cystoscopy With Biopsy 2. History of  Cystoscopy With Fulguration Minor Lesion (Under 5mm) 3. History of  Cystoscopy With Fulguration Small Lesion (5-33mm) 4. History of  Reported Hx Of Surgery For An Ulcer  Current Meds 1. Aspirin 81 MG Oral Tablet Chewable; Therapy: (Recorded:28Feb2014) to 2. Hydrochlorothiazide 25 MG Oral Tablet; Therapy: (Recorded:28Feb2014) to 3. Levofloxacin 500 MG Oral Tablet; Take one tablet daily starting day before procedure; Therapy:  28Feb2014 to (Evaluate:03Mar2014)  Requested for: 28Feb2014; Last Rx:28Feb2014 4. MetFORMIN HCl ER 500 MG Oral Tablet Extended Release 24 Hour; Therapy:  (Recorded:28Feb2014) to 5. Simvastatin 10 MG Oral Tablet; Therapy: (Recorded:28Feb2014) to  Allergies Medication  1. No Known Drug Allergies  Family History Problems  1. Family history of  Death In The Family Father age 11 ? 2. Family history of  Death In  The Family Mother age 79 3. Family history of  Family Health Status Number Of Children 2 sons 2 daughters  Social History Problems  1. Caffeine Use 2 p/d 2. Former Smoker V15.82 1 pk. p/d for 35 yrs. quit 25 yrs. 3. Marital History - Currently Married 4. Never Drank Alcohol 5. Occupation: Retired  Dance movement psychotherapist report reviewed.  IPSS: The IPSS today is 0   SHIM: The SHIM score today is 1 .    Assessment Assessed  1. Prostate Cancer 185 2. Organic Impotence 607.84   His bladder biopsy was negative but he has T1c N0 M0 Gleason 7(4+3) cancer at the right base and mid prostate. He has profound ED.   Plan Prostate Cancer (185)  1. Follow-up Schedule Surgery Office  Follow-up  Done: 08May2014 2. PT/OT Referral Referral  Referral  Requested for: 08May2014   I gave him a copy of my prostate cancer handout which details the risks we discussed and I reviewed his treatment options.  He is not a good candidate for active surveillance with his intermediate risk disease.  He is a good candidate for surgery and I would only do a left nerve spare but would do nodes.  I reviewed the procedure, the risks and the recovery in detail.   He is a candidate for IMRT or IMRT with seed boost with short course androgen ablation and I reviewed the side effects of radiation and androgen ablation in detail.  He is a potential candidate for primary cryotherapy particularly with his history of erectile  dysfunction, but I explained my lack of experience with that approach and my understanding that it is better for lower risk disease.  I also discussed salvage options for each of the therapies and explained that the cancer outcomes are likely to be similar with surgery or radiation therapy.   After completing our discussion, he has decided that he is most interested in surgical therapy which I think is very appropriate.  I will set him up for preop PT and schedule him for a RALP with left nerve  spare and node dissection.   Discussion/Summary   CC: Dr. Margot Ables.

## 2012-08-06 ENCOUNTER — Inpatient Hospital Stay (HOSPITAL_COMMUNITY)
Admission: RE | Admit: 2012-08-06 | Discharge: 2012-08-07 | DRG: 708 | Disposition: A | Discharge status: Home / Self Care | Payer: Medicare Other | Source: Ambulatory Visit | Attending: Urology | Admitting: Urology

## 2012-08-06 ENCOUNTER — Encounter (HOSPITAL_COMMUNITY)
Admission: RE | Disposition: A | Discharge status: Home / Self Care | Payer: Self-pay | Source: Ambulatory Visit | Attending: Urology

## 2012-08-06 ENCOUNTER — Inpatient Hospital Stay (HOSPITAL_COMMUNITY): Payer: Medicare Other | Admitting: Anesthesiology

## 2012-08-06 ENCOUNTER — Encounter (HOSPITAL_COMMUNITY): Payer: Self-pay | Admitting: Anesthesiology

## 2012-08-06 ENCOUNTER — Encounter (HOSPITAL_COMMUNITY): Payer: Self-pay | Admitting: *Deleted

## 2012-08-06 DIAGNOSIS — R310 Gross hematuria: Secondary | ICD-10-CM | POA: Diagnosis present

## 2012-08-06 DIAGNOSIS — E119 Type 2 diabetes mellitus without complications: Secondary | ICD-10-CM | POA: Diagnosis present

## 2012-08-06 DIAGNOSIS — E7800 Pure hypercholesterolemia, unspecified: Secondary | ICD-10-CM | POA: Diagnosis present

## 2012-08-06 DIAGNOSIS — I10 Essential (primary) hypertension: Secondary | ICD-10-CM | POA: Diagnosis present

## 2012-08-06 DIAGNOSIS — Z01812 Encounter for preprocedural laboratory examination: Secondary | ICD-10-CM | POA: Diagnosis not present

## 2012-08-06 DIAGNOSIS — C61 Malignant neoplasm of prostate: Secondary | ICD-10-CM | POA: Diagnosis not present

## 2012-08-06 DIAGNOSIS — N529 Male erectile dysfunction, unspecified: Secondary | ICD-10-CM | POA: Diagnosis present

## 2012-08-06 DIAGNOSIS — K219 Gastro-esophageal reflux disease without esophagitis: Secondary | ICD-10-CM | POA: Diagnosis not present

## 2012-08-06 DIAGNOSIS — R3915 Urgency of urination: Secondary | ICD-10-CM | POA: Diagnosis not present

## 2012-08-06 DIAGNOSIS — D1739 Benign lipomatous neoplasm of skin and subcutaneous tissue of other sites: Secondary | ICD-10-CM | POA: Diagnosis not present

## 2012-08-06 DIAGNOSIS — Z87891 Personal history of nicotine dependence: Secondary | ICD-10-CM

## 2012-08-06 HISTORY — PX: ROBOT ASSISTED LAPAROSCOPIC RADICAL PROSTATECTOMY: SHX5141

## 2012-08-06 LAB — GLUCOSE, CAPILLARY: Glucose-Capillary: 98 mg/dL (ref 70–99)

## 2012-08-06 LAB — HEMOGLOBIN AND HEMATOCRIT, BLOOD: HCT: 44.7 % (ref 39.0–52.0)

## 2012-08-06 LAB — ABO/RH: ABO/RH(D): B POS

## 2012-08-06 LAB — TYPE AND SCREEN: ABO/RH(D): B POS

## 2012-08-06 SURGERY — ROBOTIC ASSISTED LAPAROSCOPIC RADICAL PROSTATECTOMY
Anesthesia: General | Wound class: Clean Contaminated

## 2012-08-06 MED ORDER — INDIGOTINDISULFONATE SODIUM 8 MG/ML IJ SOLN
INTRAMUSCULAR | Status: DC | PRN
  Administered 2012-08-06 (×2): 5 mL via INTRAVENOUS

## 2012-08-06 MED ORDER — DEXTROSE-NACL 5-0.45 % IV SOLN
INTRAVENOUS | Status: DC
  Administered 2012-08-06 – 2012-08-07 (×2): via INTRAVENOUS

## 2012-08-06 MED ORDER — HYDROMORPHONE HCL PF 1 MG/ML IJ SOLN
0.2500 mg | INTRAMUSCULAR | Status: DC | PRN
  Administered 2012-08-06 (×2): 0.5 mg via INTRAVENOUS

## 2012-08-06 MED ORDER — HEPARIN SODIUM (PORCINE) 1000 UNIT/ML IJ SOLN
INTRAMUSCULAR | Status: AC
  Filled 2012-08-06: qty 1

## 2012-08-06 MED ORDER — STERILE WATER FOR IRRIGATION IR SOLN
Status: DC | PRN
  Administered 2012-08-06: 3000 mL

## 2012-08-06 MED ORDER — HYDROCHLOROTHIAZIDE 25 MG PO TABS
25.0000 mg | ORAL_TABLET | Freq: Every day | ORAL | Status: DC
  Administered 2012-08-06: 25 mg via ORAL
  Filled 2012-08-06 (×2): qty 1

## 2012-08-06 MED ORDER — METFORMIN HCL 500 MG PO TABS
500.0000 mg | ORAL_TABLET | Freq: Every day | ORAL | Status: DC
  Administered 2012-08-06: 500 mg via ORAL
  Filled 2012-08-06 (×2): qty 1

## 2012-08-06 MED ORDER — INDIGOTINDISULFONATE SODIUM 8 MG/ML IJ SOLN
INTRAMUSCULAR | Status: AC
  Filled 2012-08-06: qty 10

## 2012-08-06 MED ORDER — KETOROLAC TROMETHAMINE 30 MG/ML IJ SOLN: 15.0000 mg | Freq: Once | INTRAMUSCULAR | Status: DC | PRN

## 2012-08-06 MED ORDER — ACETAMINOPHEN 10 MG/ML IV SOLN
1000.0000 mg | Freq: Four times a day (QID) | INTRAVENOUS | Status: AC
  Administered 2012-08-06 – 2012-08-07 (×4): 1000 mg via INTRAVENOUS
  Filled 2012-08-06 (×4): qty 100

## 2012-08-06 MED ORDER — ONDANSETRON HCL 4 MG/2ML IJ SOLN
INTRAMUSCULAR | Status: DC | PRN
  Administered 2012-08-06: 4 mg via INTRAVENOUS

## 2012-08-06 MED ORDER — HYDROCODONE-ACETAMINOPHEN 5-325 MG PO TABS: 1.0000 | ORAL_TABLET | ORAL | Status: DC | PRN

## 2012-08-06 MED ORDER — MORPHINE SULFATE 2 MG/ML IJ SOLN: 2.0000 mg | INTRAMUSCULAR | Status: DC | PRN

## 2012-08-06 MED ORDER — ROCURONIUM BROMIDE 100 MG/10ML IV SOLN
INTRAVENOUS | Status: DC | PRN
  Administered 2012-08-06: 50 mg via INTRAVENOUS
  Administered 2012-08-06: 5 mg via INTRAVENOUS
  Administered 2012-08-06: 10 mg via INTRAVENOUS

## 2012-08-06 MED ORDER — BUPIVACAINE-EPINEPHRINE 0.25% -1:200000 IJ SOLN
INTRAMUSCULAR | Status: DC | PRN
  Administered 2012-08-06: 30 mL

## 2012-08-06 MED ORDER — HYDROCODONE-ACETAMINOPHEN 5-325 MG PO TABS: 1.0000 | ORAL_TABLET | Freq: Four times a day (QID) | ORAL | Status: DC | PRN

## 2012-08-06 MED ORDER — SODIUM CHLORIDE 0.9 % IV BOLUS (SEPSIS)
1000.0000 mL | Freq: Once | INTRAVENOUS | Status: AC
  Administered 2012-08-06: 1000 mL via INTRAVENOUS

## 2012-08-06 MED ORDER — PROMETHAZINE HCL 25 MG/ML IJ SOLN: 6.2500 mg | INTRAMUSCULAR | Status: DC | PRN

## 2012-08-06 MED ORDER — HYDROMORPHONE HCL PF 1 MG/ML IJ SOLN
INTRAMUSCULAR | Status: AC
  Filled 2012-08-06: qty 1

## 2012-08-06 MED ORDER — ROCURONIUM BROMIDE 100 MG/10ML IV SOLN: INTRAVENOUS | Status: DC | PRN

## 2012-08-06 MED ORDER — SUCCINYLCHOLINE CHLORIDE 20 MG/ML IJ SOLN
INTRAMUSCULAR | Status: DC | PRN
  Administered 2012-08-06: 100 mg via INTRAVENOUS

## 2012-08-06 MED ORDER — CEFAZOLIN SODIUM-DEXTROSE 2-3 GM-% IV SOLR
2.0000 g | INTRAVENOUS | Status: AC
  Administered 2012-08-06: 2 g via INTRAVENOUS

## 2012-08-06 MED ORDER — LACTATED RINGERS IV SOLN
INTRAVENOUS | Status: DC
  Administered 2012-08-06 (×2): via INTRAVENOUS

## 2012-08-06 MED ORDER — SIMVASTATIN 10 MG PO TABS
10.0000 mg | ORAL_TABLET | Freq: Every day | ORAL | Status: DC
  Administered 2012-08-06: 10 mg via ORAL
  Filled 2012-08-06 (×2): qty 1

## 2012-08-06 MED ORDER — MIDAZOLAM HCL 5 MG/5ML IJ SOLN
INTRAMUSCULAR | Status: DC | PRN
  Administered 2012-08-06: 2 mg via INTRAVENOUS

## 2012-08-06 MED ORDER — NEOSTIGMINE METHYLSULFATE 1 MG/ML IJ SOLN
INTRAMUSCULAR | Status: DC | PRN
  Administered 2012-08-06: 4 mg via INTRAVENOUS

## 2012-08-06 MED ORDER — CIPROFLOXACIN HCL 500 MG PO TABS: 500.0000 mg | ORAL_TABLET | Freq: Two times a day (BID) | ORAL | Status: DC

## 2012-08-06 MED ORDER — GLYCOPYRROLATE 0.2 MG/ML IJ SOLN
INTRAMUSCULAR | Status: DC | PRN
  Administered 2012-08-06: .6 mg via INTRAVENOUS

## 2012-08-06 MED ORDER — HYDRALAZINE HCL 20 MG/ML IJ SOLN
INTRAMUSCULAR | Status: DC | PRN
  Administered 2012-08-06 (×2): 2 mg via INTRAVENOUS
  Administered 2012-08-06: 4 mg via INTRAVENOUS
  Administered 2012-08-06 (×2): 2 mg via INTRAVENOUS

## 2012-08-06 MED ORDER — PROPOFOL 10 MG/ML IV BOLUS
INTRAVENOUS | Status: DC | PRN
  Administered 2012-08-06: 200 mg via INTRAVENOUS

## 2012-08-06 MED ORDER — FENTANYL CITRATE 0.05 MG/ML IJ SOLN
INTRAMUSCULAR | Status: DC | PRN
  Administered 2012-08-06 (×2): 50 ug via INTRAVENOUS
  Administered 2012-08-06: 100 ug via INTRAVENOUS
  Administered 2012-08-06 (×6): 50 ug via INTRAVENOUS

## 2012-08-06 MED ORDER — DEXAMETHASONE SODIUM PHOSPHATE 10 MG/ML IJ SOLN
INTRAMUSCULAR | Status: DC | PRN
  Administered 2012-08-06: 10 mg via INTRAVENOUS

## 2012-08-06 MED ORDER — BUPIVACAINE-EPINEPHRINE PF 0.25-1:200000 % IJ SOLN
INTRAMUSCULAR | Status: AC
  Filled 2012-08-06: qty 30

## 2012-08-06 MED ORDER — SODIUM CHLORIDE 0.9 % IR SOLN
Status: DC | PRN
  Administered 2012-08-06: 1000 mL

## 2012-08-06 MED ORDER — LACTATED RINGERS IV SOLN
INTRAVENOUS | Status: DC | PRN
  Administered 2012-08-06: 12:00:00

## 2012-08-06 SURGICAL SUPPLY — 45 items
CABLE HI FREQUENCY MONOPOLAR (ELECTROSURGICAL) ×2 IMPLANT
CANISTER SUCTION 2500CC (MISCELLANEOUS) ×2 IMPLANT
CATH FOLEY 2WAY SLVR 18FR 30CC (CATHETERS) ×2 IMPLANT
CATH ROBINSON RED A/P 16FR (CATHETERS) ×2 IMPLANT
CATH ROBINSON RED A/P 8FR (CATHETERS) ×2 IMPLANT
CATH TIEMANN FOLEY 18FR 5CC (CATHETERS) ×2 IMPLANT
CHLORAPREP W/TINT 26ML (MISCELLANEOUS) ×2 IMPLANT
CLIP LIGATING HEM O LOK PURPLE (MISCELLANEOUS) ×4 IMPLANT
CLOTH BEACON ORANGE TIMEOUT ST (SAFETY) ×2 IMPLANT
CORD HIGH FREQUENCY UNIPOLAR (ELECTROSURGICAL) ×2 IMPLANT
COVER SURGICAL LIGHT HANDLE (MISCELLANEOUS) ×2 IMPLANT
COVER TIP SHEARS 8 DVNC (MISCELLANEOUS) ×1 IMPLANT
COVER TIP SHEARS 8MM DA VINCI (MISCELLANEOUS) ×1
CUTTER ECHEON FLEX ENDO 45 340 (ENDOMECHANICALS) ×2 IMPLANT
DECANTER SPIKE VIAL GLASS SM (MISCELLANEOUS) IMPLANT
DRAPE SURG IRRIG POUCH 19X23 (DRAPES) ×2 IMPLANT
DRSG TEGADERM 2-3/8X2-3/4 SM (GAUZE/BANDAGES/DRESSINGS) ×8 IMPLANT
DRSG TEGADERM 4X4.75 (GAUZE/BANDAGES/DRESSINGS) ×4 IMPLANT
DRSG TEGADERM 6X8 (GAUZE/BANDAGES/DRESSINGS) ×4 IMPLANT
ELECT REM PT RETURN 9FT ADLT (ELECTROSURGICAL) ×2
ELECTRODE REM PT RTRN 9FT ADLT (ELECTROSURGICAL) ×1 IMPLANT
GAUZE SPONGE 2X2 8PLY STRL LF (GAUZE/BANDAGES/DRESSINGS) ×1 IMPLANT
GLOVE BIO SURGEON STRL SZ 6.5 (GLOVE) ×2 IMPLANT
GLOVE SURG SS PI 8.0 STRL IVOR (GLOVE) ×4 IMPLANT
GOWN STRL NON-REIN LRG LVL3 (GOWN DISPOSABLE) IMPLANT
GOWN STRL REIN XL XLG (GOWN DISPOSABLE) ×6 IMPLANT
HEMOSTAT SURGICEL 4X8 (HEMOSTASIS) ×2 IMPLANT
HOLDER FOLEY CATH W/STRAP (MISCELLANEOUS) ×2 IMPLANT
IV LACTATED RINGERS 1000ML (IV SOLUTION) IMPLANT
KIT ACCESSORY DA VINCI DISP (KITS) ×1
KIT ACCESSORY DVNC DISP (KITS) ×1 IMPLANT
NDL SAFETY ECLIPSE 18X1.5 (NEEDLE) ×1 IMPLANT
NEEDLE HYPO 18GX1.5 SHARP (NEEDLE) ×1
PACK ROBOT UROLOGY CUSTOM (CUSTOM PROCEDURE TRAY) ×2 IMPLANT
RELOAD GREEN ECHELON 45 (STAPLE) ×2 IMPLANT
SEALER TISSUE G2 CVD JAW 45CM (ENDOMECHANICALS) ×2 IMPLANT
SET TUBE IRRIG SUCTION NO TIP (IRRIGATION / IRRIGATOR) ×2 IMPLANT
SHEET LAVH (DRAPES) ×2 IMPLANT
SOLUTION ELECTROLUBE (MISCELLANEOUS) ×2 IMPLANT
SPONGE GAUZE 2X2 STER 10/PKG (GAUZE/BANDAGES/DRESSINGS) ×1
SUT ETHILON 3 0 PS 1 (SUTURE) ×2 IMPLANT
SUT VICRYL 0 UR6 27IN ABS (SUTURE) ×2 IMPLANT
SYR 27GX1/2 1ML LL SAFETY (SYRINGE) ×2 IMPLANT
TOWEL OR NON WOVEN STRL DISP B (DISPOSABLE) ×2 IMPLANT
WATER STERILE IRR 1500ML POUR (IV SOLUTION) IMPLANT

## 2012-08-06 NOTE — Progress Notes (Signed)
Patient ID: Robert Bishop, male   DOB: 01-Nov-1945, 67 y.o.   MRN: [id number]  He is doing well and his urine is clearing up .

## 2012-08-06 NOTE — Brief Op Note (Signed)
08/06/2012  2:44 PM  PATIENT:  Robert Bishop  67 y.o. male  PRE-OPERATIVE DIAGNOSIS:  prostate cancer T1c N0 M0 Gleason 7  POST-OPERATIVE DIAGNOSIS:  prostate cancer  PROCEDURE:  Procedure(s): ROBOTIC ASSISTED LAPAROSCOPIC RADICAL PROSTATECTOMY WITH NODE DISSECTION AND LEFT NERVE SPARE ONLY (N/A)  SURGEON:  Surgeon(s) and Role:    * Anner Crete, MD - Primary  PHYSICIAN ASSISTANT:   ASSISTANTS: none   ANESTHESIA:   general  EBL:  Total I/O In: 1000 [I.V.:1000] Out: 100 [Blood:100]  BLOOD ADMINISTERED:none  DRAINS: (48fr) Jackson-Pratt drain(s) with closed bulb suction in the LLQ and Urinary Catheter (Foley)   LOCAL MEDICATIONS USED:  MARCAINE     SPECIMEN:  Source of Specimen:  prostate and SV, peri prostatic fat, bilateral pelvic nodes.   DISPOSITION OF SPECIMEN:  PATHOLOGY  COUNTS:  YES  TOURNIQUET:  * No tourniquets in log *  DICTATION: .Other Dictation: Dictation Number [id number]  PLAN OF CARE: Admit for overnight observation  PATIENT DISPOSITION:  PACU - hemodynamically stable.   Delay start of Pharmacological VTE agent (>24hrs) due to surgical blood loss or risk of bleeding: not applicable

## 2012-08-06 NOTE — Preoperative (Signed)
Beta Blockers   Reason not to administer Beta Blockers:Not Applicable 

## 2012-08-06 NOTE — Anesthesia Preprocedure Evaluation (Signed)
Anesthesia Evaluation  Patient identified by MRN, date of birth, ID band Patient awake    Reviewed: Allergy & Precautions, H&P , NPO status , Patient's Chart, lab work & pertinent test results  Airway Mallampati: III TM Distance: <3 FB Neck ROM: Full    Dental no notable dental hx.    Pulmonary neg pulmonary ROS,  breath sounds clear to auscultation  Pulmonary exam normal       Cardiovascular hypertension, Pt. on medications Rhythm:Regular Rate:Normal     Neuro/Psych negative neurological ROS  negative psych ROS   GI/Hepatic Neg liver ROS, GERD-  Medicated,  Endo/Other  diabetes, Oral Hypoglycemic AgentsMorbid obesity  Renal/GU negative Renal ROS  negative genitourinary   Musculoskeletal negative musculoskeletal ROS (+)   Abdominal   Peds negative pediatric ROS (+)  Hematology negative hematology ROS (+)   Anesthesia Other Findings   Reproductive/Obstetrics negative OB ROS                           Anesthesia Physical Anesthesia Plan  ASA: III  Anesthesia Plan: General   Post-op Pain Management:    Induction: Intravenous  Airway Management Planned: Oral ETT  Additional Equipment:   Intra-op Plan:   Post-operative Plan: Extubation in OR  Informed Consent: I have reviewed the patients History and Physical, chart, labs and discussed the procedure including the risks, benefits and alternatives for the proposed anesthesia with the patient or authorized representative who has indicated his/her understanding and acceptance.   Dental advisory given  Plan Discussed with: CRNA and Surgeon  Anesthesia Plan Comments:         Anesthesia Quick Evaluation

## 2012-08-06 NOTE — Anesthesia Postprocedure Evaluation (Signed)
  Anesthesia Post-op Note  Patient: Robert Bishop  Procedure(s) Performed: Procedure(s) (LRB): ROBOTIC ASSISTED LAPAROSCOPIC RADICAL PROSTATECTOMY WITH NODE DISSECTION AND LEFT NERVE SPARE ONLY (N/A)  Patient Location: PACU  Anesthesia Type: General  Level of Consciousness: awake and alert   Airway and Oxygen Therapy: Patient Spontanous Breathing  Post-op Pain: mild  Post-op Assessment: Post-op Vital signs reviewed, Patient's Cardiovascular Status Stable, Respiratory Function Stable, Patent Airway and No signs of Nausea or vomiting  Last Vitals:  Filed Vitals:   08/06/12 1530  BP: 163/85  Pulse: 74  Temp: 36.7 C  Resp: 15    Post-op Vital Signs: stable   Complications: No apparent anesthesia complications

## 2012-08-06 NOTE — Transfer of Care (Signed)
Immediate Anesthesia Transfer of Care Note  Patient: Robert Bishop  Procedure(s) Performed: Procedure(s): ROBOTIC ASSISTED LAPAROSCOPIC RADICAL PROSTATECTOMY WITH NODE DISSECTION AND LEFT NERVE SPARE ONLY (N/A)  Patient Location: PACU  Anesthesia Type:General  Level of Consciousness: awake and patient cooperative  Airway & Oxygen Therapy: Patient Spontanous Breathing and Patient connected to face mask oxygen  Post-op Assessment: Report given to PACU RN and Post -op Vital signs reviewed and stable  Post vital signs: Reviewed and stable  Complications: No apparent anesthesia complications

## 2012-08-06 NOTE — Interval H&P Note (Signed)
History and Physical Interval Note:  He has no questions.  PE: Gen WD, WN, NAD. Lungs CTA CV RRR.   08/06/2012 12:11 PM  Robert Bishop  has presented today for surgery, with the diagnosis of prostate cancer  The various methods of treatment have been discussed with the patient and family. After consideration of risks, benefits and other options for treatment, the patient has consented to  Procedure(s): ROBOTIC ASSISTED LAPAROSCOPIC RADICAL PROSTATECTOMY WITH NODE DISSECTION AND LEFT NERVE SPARE ONLY (N/A) as a surgical intervention .  The patient's history has been reviewed, patient examined, no change in status, stable for surgery.  I have reviewed the patient's chart and labs.  Questions were answered to the patient's satisfaction.     [name],[name]

## 2012-08-06 NOTE — Progress Notes (Signed)
Utilization review completed.  

## 2012-08-06 NOTE — Progress Notes (Signed)
Patient ID: Robert Bishop, male   DOB: 1945-10-16, 67 y.o.   MRN: [id number] Post-op note  Subjective: The patient is doing well.  No complaints. Denies N/V.  Objective: Vital signs in last 24 hours: Temp:  [97 F (36.1 C)-98 F (36.7 C)] 97.9 F (36.6 C) (06/11 1613) Pulse Rate:  [53-78] 74 (06/11 1613) Resp:  [13-18] 16 (06/11 1613) BP: (135-163)/(80-89) 157/82 mmHg (06/11 1613) SpO2:  [97 %-100 %] 100 % (06/11 1613)  Intake/Output from previous day:   Intake/Output this shift: Total I/O In: 2500 [I.V.:1500; IV Piggyback:1000] Out: 825 [Urine:675; Drains:50; Blood:100]  Physical Exam:  General: Alert and oriented. Abdomen: Soft, Nondistended. Incisions: Clean and dry.  Lab Results:  Recent Labs  08/06/12 1507  HGB 14.6  HCT 44.7    Assessment/Plan: POD#0   1) Continue to monitor 2) DVT prophy, amb, IS, pain control    LOS: 0 days   YARBROUGH,Elisabeth Strom G. 08/06/2012, 4:31 PM

## 2012-08-07 ENCOUNTER — Encounter (HOSPITAL_COMMUNITY): Payer: Self-pay | Admitting: Urology

## 2012-08-07 LAB — BASIC METABOLIC PANEL
BUN: 11 mg/dL (ref 6–23)
CO2: 26 mEq/L (ref 19–32)
Calcium: 8.7 mg/dL (ref 8.4–10.5)
Glucose, Bld: 158 mg/dL — ABNORMAL HIGH (ref 70–99)
Sodium: 134 mEq/L — ABNORMAL LOW (ref 135–145)

## 2012-08-07 LAB — HEMOGLOBIN AND HEMATOCRIT, BLOOD: HCT: 41.7 % (ref 39.0–52.0)

## 2012-08-07 MED ORDER — BISACODYL 10 MG RE SUPP
10.0000 mg | Freq: Once | RECTAL | Status: AC
  Administered 2012-08-07: 10 mg via RECTAL
  Filled 2012-08-07: qty 1

## 2012-08-07 NOTE — Op Note (Signed)
NAMEJAMORION, GOMILLION NO.:  [id number]  MEDICAL RECORD NO.:  [id number]  LOCATION:  1409                         FACILITY:  [facility]  PHYSICIAN:  Excell Seltzer. Annabell Howells, M.D.    DATE OF BIRTH:  [birth year]  DATE OF PROCEDURE:  08/06/2012 DATE OF DISCHARGE:                              OPERATIVE REPORT   PROCEDURE:  Robot-assisted laparoscopic radical prostatectomy with left nerve-spare and bilateral pelvic lymph node dissection.  PREOPERATIVE DIAGNOSIS:  T1c N0 M0 Gleason 7 adenocarcinoma of the prostate.  POSTOPERATIVE DIAGNOSIS:  T1c N0 M0 Gleason 7 adenocarcinoma of the prostate.  SURGEON:  Jaythen Hamme. Annabell Howells, M.D.  ASSISTANT:  Lujean Rave PA. ANESTHESIA:  General.  ESTIMATED BLOOD LOSS:  100 mL.  DRAINS:  A 19-French, left lower quadrant Blake drain and an 18-French Foley catheter.  ESTIMATED BLOOD LOSS:  100 mL.  COUNTS:  Sponge, instrument, and needle count correct.  COMPLICATIONS:  None.  INDICATIONS:  Mr. Keeven is a 67 year old,  African American male with a history of a PSA of 5, who was found on biopsy to have 2 cores of Gleason 7 and a hypoechoic lesion at the right base of the prostate.  He has elected surgical therapy and because of his profound erectile dysfunction I will do a wide resection on the right, and a nerve spare on the left for retained continence.  I will also do nodes.  FINDINGS OF PROCEDURE:  He underwent routine preoperative physical therapy teaching and hospital training.  The routine bowel prep was performed.  He was taken to the operating room where he was given 2 g Ancef.  He was placed in the supine position, and general anesthetic was induced.  His arms were tucked.  He was then placed in the low lithotomy position.  His abdomen was clipped.  A red rubber rectal catheter was placed and secured to his left thigh with an attached bulb syringe.  He was then prepped with Betadine solution for the genitalia and ChloraPrep for  the abdomen after appropriate drying time.  He was placed in the steep Trendelenburg position, and the drapes were applied in the usual sterile fashion with an OpSite used to secure the remainder of the drapes.  Once properly positioned, the video port site was marked just above the umbilicus and slightly to the left.  A 2-cm incision was made with a knife.  This was carried down through the subcutaneous tissue using the hemostat.  The anterior rectus fascia was incised with the Bovie.  The rectus muscle was parted bluntly, and the posterior sheath was exposed. This was nicked with a knife, and a hemostat was used to enter the abdominal cavity.  A finger was then placed to ensure no significant peri-port adhesions were noted.  There were none.  A 12-mm port was then placed.  The skin and subcutaneous tissues were secured around the port using a 0 Vicryl suture.  The remaining port sites were marked in the standard configuration and infiltrated with 0.25% Marcaine.  Incisions were made and the ports were placed with two 8-mm ports on the left, an 8 mm port in the right medially, a 12 mm assistant port  right laterally, and a 5-mm assistant port superior medially.  Once all ports were in position, the robot, which had been draped previously was brought on the field and docked to the ports.  The instruments were then placed with a PK dissector in the left medial robot port, a ProGrasp in the left lateral robot port and a cautery scissors in the right robot port.  The dissection was then initiated with the incision in the peritoneum transversely superior to the bladder with division obliterated umbilical arteries.  The bladder, which had been filled with irrigant, to aid identification was dropped away from the anterior abdominal wall until the pubis was identified, the bladder was then drained.  The periprosthetic space was then exposed, and the fourth arm was used to retract the bladder  in a stepwise fashion.  The anterior prostate was defatted.  The specimen was sent for analysis.  The right endopelvic fascia was incised.  The plane was developed between the prostate and the sidewalls out to the apex.  The puboprostatic ligament was divided on the right allowing exposure of the lateral aspect of the dorsal vein complex down to the urethra.  This was then repeated on the left side in an identical fashion.  Once the dorsal vein complex had been dissected out, an Endo-GIA was used to ligate and divide that structure.  The patient was given an ampule of indigo carmine and the Foley was used to identify the bladder neck which was incised transversely with cautery scissors.  Once the Foley was identified, it was grasped with the ProGrasp and then placed on anterior traction.  The posterior bladder neck was then divided with cautery scissors until the structures were exposed.  Care was taken to avoid excessive dissection of the bladder neck or entry into the prostate.  The right vasal ampulla was identified.  It was grasped and dissected out and divided and used to provide anterior traction.  The right seminal vesicle was then dissected out as well and relatively steady seminal vesicles.  The left ampulla was dissected out followed by the left seminal vesicle.  Once the structures had been dissected out, Denonvilliers fascia was incised in the posterior plane from the base of the prostate to apex. The apex was developed with the ProGrasp using blunt dissection.  At this point, the right prostatic pedicle was taken down with the EnSeal device.  Nerve spare was not performed, so wide resection was done on this side.  Once the pedicle had been taken down, the remaining lateral attachments were taken down with blunt and sharp dissection to the apex without significant bleeding.  A left nerve spare was then performed by incising the periprostatic fascia and developing the  plane between the prostate and the neurovascular bundle.  Once this was sufficiently dissected off, the EnSeal was used to divide the left prostatic pedicle, and the lateral attachments were taken down bluntly to the apex.  The residual dorsal vein complex was then incised with the Bovie exposing the urethra, which was divided sharply.  The residual rectourethralis attachments were taken down sharply with sharp and blunt dissection and the prostate was removed from the field.  At this point, there was no significant bleeding from the pelvic fossa. The fossa was irrigated and insufflated without evidence of rectal injury.  I then turned my attention to the lymph node dissection.  The right iliac vein was identified, and the lymph node packet was developed inferiorly to this with the limits of  dissection being the circumflex iliac vein, the bifurcation of hypogastric artery and the obturator nerve.  Clips were used to control the proximal and distal ends of the node packet, which was then passed off the field.  Inspection revealed no significant bleeding.  The left lymph node dissection was then performed in an identical fashion using clips to control the lymphatic channels.  The obturator nerve was identified bilaterally and protected throughout both portions of the procedure.  There was some minor bleeding on the left side. After removal of the lymph node packet.  This was controlled with Surgicel and later upon removal of the Surgicel, there was no residual bleeding.  Once the lymph node dissection was completed, a 2-0 Vicryl was then used to tether the bladder neck to the posterior urethra encompassing the rectourethralis complex and the cut edge of the Denonvilliers fascia using a slipknot to pull the 2 edges together.  Once the urethra and bladder neck were reapproximated in this fashion, a running 4-0 bicolor Monocryl was used to complete the urethrovesical anastomosis.  Once  the anastomosis was completed, and the initial surgeon's knot was placed, a fresh 18-French Coude Foley catheter was placed.  The bladder was irrigated with return of blue urine and no evidence of anastomotic leak.  The suture was then tied to completion and trimmed.  At this point, the prostate was then placed into an entrapment sac, and a 19-French Blake drain was brought in through the fourth arm port.  Once this had been completed, the robot was undocked. The 12 mm assistant port was removed and closed using a 0 Vicryl on a needle passer.  The remaining ports were removed under visual inspection.  The video port was removed, and the suture was released.  The incision was then extended using the Bovie to allow removal of the prostate. Once the prostate was removed, there was some minor bleeding from the rectus muscle that was controlled with cautery.  The wound was then closed using a running 0 Vicryl suture.  The port sites and incision were infiltrated with 0.25% Marcaine and closed with staples.  The drain had been secured to the skin with 3-0 nylon suture.  The drapes were removed.  Dressings were applied.  The patient was taken down from lithotomy position.  His anesthetic was reversed.  He was moved to recovery in stable condition.  There were no complications.  As noted, sponge, instrument, and needle counts were correct.     Excell Seltzer. Annabell Howells, M.D.     JJW/MEDQ  D:  08/06/2012  T:  08/07/2012  Job:  [id number]  cc:   Quintin Alto, MD Fax: [phone]

## 2012-08-07 NOTE — Progress Notes (Signed)
Visited with patient briefly today. He indicates that he had surgery and is to be discharged today. Gave support and encouragement and prayed with him, asking for strength and peace and calm for the days ahead.

## 2012-08-07 NOTE — Progress Notes (Addendum)
1 Day Post-Op Subjective: Patient reports pain control good.  Has amb and denies N/V  Objective: Vital signs in last 24 hours: Temp:  [97 F (36.1 C)-98.6 F (37 C)] 98.6 F (37 C) (06/12 0526) Pulse Rate:  [53-78] 61 (06/12 0526) Resp:  [13-18] 18 (06/12 0526) BP: (103-163)/(65-89) 135/73 mmHg (06/12 0526) SpO2:  [97 %-100 %] 100 % (06/12 0526) Weight:  [114.2 kg (251 lb 12.3 oz)] 114.2 kg (251 lb 12.3 oz) (06/11 1620)  Intake/Output from previous day: 06/11 0701 - 06/12 0700 In: 2640 [I.V.:1500; IV Piggyback:1100] Out: 5050 [Urine:4850; Drains:100; Blood:100] Intake/Output this shift:    Physical Exam:  General:alert, cooperative and no distress Cardiovascular: RRR Lungs: CTA GI: soft, non tender, normal bowel sounds, no palpable masses Incisions: dressings C/D/I Urine: yellow Extremities: SCDs in place  Lab Results:  Recent Labs  08/06/12 1507 08/07/12 0543  HGB 14.6 13.5  HCT 44.7 41.7   BMET  Recent Labs  08/07/12 0543  NA 134*  K 4.2  CL 101  CO2 26  GLUCOSE 158*  BUN 11  CREATININE 0.98  CALCIUM 8.7   No results found for this basename: LABPT, INR,  in the last 72 hours No results found for this basename: LABURIN,  in the last 72 hours Results for orders placed during the hospital encounter of 07/28/12  SURGICAL PCR SCREEN     Status: None   Collection Time    07/28/12  8:45 AM      Result Value Range Status   MRSA, PCR NEGATIVE  NEGATIVE Final   Staphylococcus aureus NEGATIVE  NEGATIVE Final   Comment:            The Xpert SA Assay (FDA     approved for NASAL specimens     in patients over 48 years of age),     is one component of     a comprehensive surveillance     program.  Test performance has     been validated by [employer] for patients greater     than or equal to 71 year old.     It is not intended     to diagnose infection nor to     guide or monitor treatment.    Studies/Results: No results  found.  Assessment/Plan: 1 Day Post-Op, Procedure(s) (LRB): ROBOTIC ASSISTED LAPAROSCOPIC RADICAL PROSTATECTOMY WITH NODE DISSECTION AND LEFT NERVE SPARE ONLY (N/A)  Ambulate, Incentive spirometry DVT prophylaxis Transition to PO pain medications D/C pelvic drain SL IVF Dulcolax supp Poss d/c later today  LOS: 1 day   YARBROUGH,AMANDA G. 08/07/2012, 7:44 AM    Seen and agree.

## 2012-08-07 NOTE — Discharge Summary (Signed)
  Date of admission: 08/06/2012  Date of discharge: 08/07/2012  Admission diagnosis: Prostate Cancer  Discharge diagnosis: Prostate Cancer  History and Physical: For full details, please see admission history and physical. Briefly, Robert Bishop is a 67 y.o. gentleman with localized prostate cancer.  After discussing management/treatment options, he elected to proceed with surgical treatment.  Hospital Course: Robert Bishop was taken to the operating room on 08/06/2012 and underwent a robotic assisted laparoscopic radical prostatectomy. He tolerated this procedure well and without complications. Postoperatively, he was able to be transferred to a regular hospital room following recovery from anesthesia.  He was able to begin ambulating the night of surgery. He remained hemodynamically stable overnight.  He had excellent urine output with appropriately minimal output from his pelvic drain and his pelvic drain was removed on POD #1.  He was transitioned to oral pain medication, tolerated a clear liquid diet, and had met all discharge criteria and was able to be discharged home later on POD#1.  Laboratory values:  Recent Labs  08/06/12 1507 08/07/12 0543  HGB 14.6 13.5  HCT 44.7 41.7    Disposition: Home  Discharge instruction: He was instructed to be ambulatory but to refrain from heavy lifting, strenuous activity, or driving. He was instructed on urethral catheter care.  Discharge medications:     Medication List    STOP taking these medications       aspirin EC 81 MG tablet      TAKE these medications       ciprofloxacin 500 MG tablet  Commonly known as:  CIPRO  Take 1 tablet (500 mg total) by mouth 2 (two) times daily. Start day prior to office visit for foley removal     hydrochlorothiazide 25 MG tablet  Commonly known as:  HYDRODIURIL  Take 25 mg by mouth at bedtime.     HYDROcodone-acetaminophen 5-325 MG per tablet  Commonly known as:  NORCO  Take 1-2 tablets by mouth  every 6 (six) hours as needed for pain.     metFORMIN 500 MG tablet  Commonly known as:  GLUCOPHAGE  Take 500 mg by mouth daily with supper.     simvastatin 10 MG tablet  Commonly known as:  ZOCOR  Take 10 mg by mouth at bedtime.        Followup: He will followup in 1 week for catheter removal and to discuss his surgical pathology results.

## 2012-08-07 NOTE — Care Management Note (Signed)
    Page 1 of 1   08/07/2012     2:50:10 PM   CARE MANAGEMENT NOTE 08/07/2012  Patient:  Robert Bishop, Robert Bishop   Account Number:  [id number]  Date Initiated:  08/07/2012  Documentation initiated by:  Lanier Clam  Subjective/Objective Assessment:   ADMITTED W/PROSTATE CA.     Action/Plan:   FROM HOME   Anticipated DC Date:  08/07/2012   Anticipated DC Plan:  HOME/SELF CARE      DC Planning Services  CM consult      Choice offered to / List presented to:             Status of service:  Completed, signed off Medicare Important Message given?   (If response is "NO", the following Medicare IM given date fields will be blank) Date Medicare IM given:   Date Additional Medicare IM given:    Discharge Disposition:  HOME/SELF CARE  Per UR Regulation:  Reviewed for med. necessity/level of care/duration of stay  If discussed at Long Length of Stay Meetings, dates discussed:    Comments:  08/07/12 [facility] RN,BSN NCM [phone] S/P LAP RAD PROSTATECTOMY.NO D/C NEEDS.

## 2012-08-07 NOTE — Progress Notes (Signed)
Discharge to home, ambulatory, no complaints of any pain or discomfort upon discharge. PIV removed no s/s of  Swelling noted on insertion site. D/c instructions and follow up appointment done and was given to the patient. D/c with foley catheter in place draining with clear yellow urine, Foley cath to leg bag teaching reinforce.

## 2012-08-28 DIAGNOSIS — E782 Mixed hyperlipidemia: Secondary | ICD-10-CM | POA: Diagnosis not present

## 2012-08-28 DIAGNOSIS — E119 Type 2 diabetes mellitus without complications: Secondary | ICD-10-CM | POA: Diagnosis not present

## 2012-08-28 DIAGNOSIS — E559 Vitamin D deficiency, unspecified: Secondary | ICD-10-CM | POA: Diagnosis not present

## 2012-08-28 DIAGNOSIS — I10 Essential (primary) hypertension: Secondary | ICD-10-CM | POA: Diagnosis not present

## 2012-09-03 DIAGNOSIS — N3946 Mixed incontinence: Secondary | ICD-10-CM | POA: Diagnosis not present

## 2012-09-03 DIAGNOSIS — M6281 Muscle weakness (generalized): Secondary | ICD-10-CM | POA: Diagnosis not present

## 2012-09-03 DIAGNOSIS — R279 Unspecified lack of coordination: Secondary | ICD-10-CM | POA: Diagnosis not present

## 2012-09-05 DIAGNOSIS — C61 Malignant neoplasm of prostate: Secondary | ICD-10-CM | POA: Diagnosis not present

## 2012-09-08 DIAGNOSIS — R279 Unspecified lack of coordination: Secondary | ICD-10-CM | POA: Diagnosis not present

## 2012-09-08 DIAGNOSIS — M62838 Other muscle spasm: Secondary | ICD-10-CM | POA: Diagnosis not present

## 2012-09-08 DIAGNOSIS — N3946 Mixed incontinence: Secondary | ICD-10-CM | POA: Diagnosis not present

## 2012-09-08 DIAGNOSIS — M6281 Muscle weakness (generalized): Secondary | ICD-10-CM | POA: Diagnosis not present

## 2012-09-25 DIAGNOSIS — M62838 Other muscle spasm: Secondary | ICD-10-CM | POA: Diagnosis not present

## 2012-09-25 DIAGNOSIS — M6281 Muscle weakness (generalized): Secondary | ICD-10-CM | POA: Diagnosis not present

## 2012-09-25 DIAGNOSIS — N3946 Mixed incontinence: Secondary | ICD-10-CM | POA: Diagnosis not present

## 2012-09-25 DIAGNOSIS — R279 Unspecified lack of coordination: Secondary | ICD-10-CM | POA: Diagnosis not present

## 2012-09-26 DIAGNOSIS — E119 Type 2 diabetes mellitus without complications: Secondary | ICD-10-CM | POA: Diagnosis not present

## 2012-09-26 DIAGNOSIS — C61 Malignant neoplasm of prostate: Secondary | ICD-10-CM | POA: Diagnosis not present

## 2012-09-26 DIAGNOSIS — I10 Essential (primary) hypertension: Secondary | ICD-10-CM | POA: Diagnosis not present

## 2012-09-26 DIAGNOSIS — E782 Mixed hyperlipidemia: Secondary | ICD-10-CM | POA: Diagnosis not present

## 2012-09-26 DIAGNOSIS — K5730 Diverticulosis of large intestine without perforation or abscess without bleeding: Secondary | ICD-10-CM | POA: Diagnosis not present

## 2012-09-26 DIAGNOSIS — Z0000 Encounter for general adult medical examination without abnormal findings: Secondary | ICD-10-CM | POA: Diagnosis not present

## 2012-09-26 DIAGNOSIS — K5909 Other constipation: Secondary | ICD-10-CM | POA: Diagnosis not present

## 2012-09-26 DIAGNOSIS — N529 Male erectile dysfunction, unspecified: Secondary | ICD-10-CM | POA: Diagnosis not present

## 2012-10-01 DIAGNOSIS — M62838 Other muscle spasm: Secondary | ICD-10-CM | POA: Diagnosis not present

## 2012-10-01 DIAGNOSIS — N3946 Mixed incontinence: Secondary | ICD-10-CM | POA: Diagnosis not present

## 2012-10-01 DIAGNOSIS — R279 Unspecified lack of coordination: Secondary | ICD-10-CM | POA: Diagnosis not present

## 2012-10-01 DIAGNOSIS — M6281 Muscle weakness (generalized): Secondary | ICD-10-CM | POA: Diagnosis not present

## 2012-10-08 DIAGNOSIS — M6281 Muscle weakness (generalized): Secondary | ICD-10-CM | POA: Diagnosis not present

## 2012-10-08 DIAGNOSIS — N3946 Mixed incontinence: Secondary | ICD-10-CM | POA: Diagnosis not present

## 2012-10-08 DIAGNOSIS — R279 Unspecified lack of coordination: Secondary | ICD-10-CM | POA: Diagnosis not present

## 2012-10-20 DIAGNOSIS — M62838 Other muscle spasm: Secondary | ICD-10-CM | POA: Diagnosis not present

## 2012-10-20 DIAGNOSIS — R279 Unspecified lack of coordination: Secondary | ICD-10-CM | POA: Diagnosis not present

## 2012-10-20 DIAGNOSIS — N3946 Mixed incontinence: Secondary | ICD-10-CM | POA: Diagnosis not present

## 2012-10-20 DIAGNOSIS — M6281 Muscle weakness (generalized): Secondary | ICD-10-CM | POA: Diagnosis not present

## 2012-11-03 DIAGNOSIS — R279 Unspecified lack of coordination: Secondary | ICD-10-CM | POA: Diagnosis not present

## 2012-11-03 DIAGNOSIS — M6281 Muscle weakness (generalized): Secondary | ICD-10-CM | POA: Diagnosis not present

## 2012-11-03 DIAGNOSIS — N3946 Mixed incontinence: Secondary | ICD-10-CM | POA: Diagnosis not present

## 2013-01-02 DIAGNOSIS — C61 Malignant neoplasm of prostate: Secondary | ICD-10-CM | POA: Diagnosis not present

## 2013-01-14 DIAGNOSIS — C61 Malignant neoplasm of prostate: Secondary | ICD-10-CM | POA: Diagnosis not present

## 2013-01-14 DIAGNOSIS — N3946 Mixed incontinence: Secondary | ICD-10-CM | POA: Diagnosis not present

## 2013-01-14 DIAGNOSIS — R3915 Urgency of urination: Secondary | ICD-10-CM | POA: Diagnosis not present

## 2013-01-14 DIAGNOSIS — R3129 Other microscopic hematuria: Secondary | ICD-10-CM | POA: Diagnosis not present

## 2013-03-24 DIAGNOSIS — E119 Type 2 diabetes mellitus without complications: Secondary | ICD-10-CM | POA: Diagnosis not present

## 2013-03-24 DIAGNOSIS — E782 Mixed hyperlipidemia: Secondary | ICD-10-CM | POA: Diagnosis not present

## 2013-03-24 DIAGNOSIS — I10 Essential (primary) hypertension: Secondary | ICD-10-CM | POA: Diagnosis not present

## 2013-03-24 DIAGNOSIS — K5730 Diverticulosis of large intestine without perforation or abscess without bleeding: Secondary | ICD-10-CM | POA: Diagnosis not present

## 2013-03-31 DIAGNOSIS — M1990 Unspecified osteoarthritis, unspecified site: Secondary | ICD-10-CM | POA: Diagnosis not present

## 2013-03-31 DIAGNOSIS — N529 Male erectile dysfunction, unspecified: Secondary | ICD-10-CM | POA: Diagnosis not present

## 2013-03-31 DIAGNOSIS — I10 Essential (primary) hypertension: Secondary | ICD-10-CM | POA: Diagnosis not present

## 2013-03-31 DIAGNOSIS — E119 Type 2 diabetes mellitus without complications: Secondary | ICD-10-CM | POA: Diagnosis not present

## 2013-03-31 DIAGNOSIS — C61 Malignant neoplasm of prostate: Secondary | ICD-10-CM | POA: Diagnosis not present

## 2013-03-31 DIAGNOSIS — E782 Mixed hyperlipidemia: Secondary | ICD-10-CM | POA: Diagnosis not present

## 2013-03-31 DIAGNOSIS — K5730 Diverticulosis of large intestine without perforation or abscess without bleeding: Secondary | ICD-10-CM | POA: Diagnosis not present

## 2013-04-20 DIAGNOSIS — E119 Type 2 diabetes mellitus without complications: Secondary | ICD-10-CM | POA: Diagnosis not present

## 2013-04-20 DIAGNOSIS — E1165 Type 2 diabetes mellitus with hyperglycemia: Secondary | ICD-10-CM | POA: Diagnosis not present

## 2013-04-20 DIAGNOSIS — E11319 Type 2 diabetes mellitus with unspecified diabetic retinopathy without macular edema: Secondary | ICD-10-CM | POA: Diagnosis not present

## 2013-04-20 DIAGNOSIS — H2510 Age-related nuclear cataract, unspecified eye: Secondary | ICD-10-CM | POA: Diagnosis not present

## 2013-04-20 DIAGNOSIS — E1139 Type 2 diabetes mellitus with other diabetic ophthalmic complication: Secondary | ICD-10-CM | POA: Diagnosis not present

## 2013-07-15 DIAGNOSIS — C61 Malignant neoplasm of prostate: Secondary | ICD-10-CM | POA: Diagnosis not present

## 2013-07-22 DIAGNOSIS — Z8546 Personal history of malignant neoplasm of prostate: Secondary | ICD-10-CM | POA: Diagnosis not present

## 2013-07-22 DIAGNOSIS — N390 Urinary tract infection, site not specified: Secondary | ICD-10-CM | POA: Diagnosis not present

## 2013-07-22 DIAGNOSIS — N529 Male erectile dysfunction, unspecified: Secondary | ICD-10-CM | POA: Diagnosis not present

## 2013-07-23 ENCOUNTER — Other Ambulatory Visit: Payer: Self-pay | Admitting: Urology

## 2013-08-05 ENCOUNTER — Encounter (HOSPITAL_COMMUNITY): Payer: Self-pay | Admitting: Pharmacy Technician

## 2013-08-07 NOTE — Patient Instructions (Addendum)
20     Your procedure is scheduled on:  Tuesday 08/18/2013  Report to [facility] Main Entrance and follow signs to Short Stay  At 0530  AM.  Call this number if you have problems the night before or morning of surgery:  [phone]   Remember:             IF YOU USE CPAP,BRING MASK AND TUBING AM OF SURGERY!             IF YOU DO NOT HAVE YOUR TYPE AND SCREEN DRAWN AT PRE-ADMIT APPOINTMENT, YOU WILL HAVE IT DRAWN AM OF SURGERY!   Do not eat food or drink liquids AFTER MIDNIGHT!  Take these medicines the morning of surgery with A SIP OF WATER: NONE    [facility] IS NOT RESPONSIBLE FOR ANY BELONGINGS OR VALUABLES BROUGHT TO HOSPITAL.  Marland Kitchen  Leave suitcase in the car. After surgery it may be brought to your room.  For patients admitted to the hospital, checkout time is 11:00 AM the day of              Discharge.    DO NOT WEAR JEWELRY,MAKE-UP,LOTIONS,POWDERS,PERFUMES,CONTACTS , DENTURES OR BRIDGEWORK ,AND DO NOT WEAR FALSE EYELASHES                                    Patients discharged the day of surgery will not be allowed to drive home.  If going home the same day of surgery, must have someone stay with you first 24 hrs.at home and arrange for someone to drive you home from the [facility]: daughter-[name]   Special Instructions:              Please read over the following fact sheets that you were given:             1. [place].Tobin Chad     [phone]                              [facility] - Preparing for Surgery Before surgery, you can play an important role.  Because skin is not sterile, your skin needs to be as free of germs as possible.  You can reduce the number of germs on your skin by washing with CHG (chlorahexidine gluconate) soap before surgery.  CHG is an antiseptic cleaner which kills germs and bonds  with the skin to continue killing germs even after washing. Please DO NOT use if you have an allergy to CHG or antibacterial soaps.  If your skin becomes reddened/irritated stop using the CHG and inform your nurse when you arrive at Short Stay. Do not shave (including legs and underarms) for at least 48 hours prior to the first CHG shower.  You may shave your face/neck. Please follow these instructions carefully:  1.  Shower with CHG Soap the night before  surgery and the  morning of Surgery.  2.  If you choose to wash your hair, wash your hair first as usual with your  normal  shampoo.  3.  After you shampoo, rinse your hair and body thoroughly to remove the  shampoo.                           4.  Use CHG as you would any other liquid soap.  You can apply chg directly  to the skin and wash                       Gently with a scrungie or clean washcloth.  5.  Apply the CHG Soap to your body ONLY FROM THE NECK DOWN.   Do not use on face/ open                           Wound or open sores. Avoid contact with eyes, ears mouth and genitals (private parts).                       Wash face,  Genitals (private parts) with your normal soap.             6.  Wash thoroughly, paying special attention to the area where your surgery  will be performed.  7.  Thoroughly rinse your body with warm water from the neck down.  8.  DO NOT shower/wash with your normal soap after using and rinsing off  the CHG Soap.                9.  Pat yourself dry with a clean towel.            10.  Wear clean pajamas.            11.  Place clean sheets on your bed the night of your first shower and do not  sleep with pets. Day of Surgery : Do not apply any lotions/deodorants the morning of surgery.  Please wear clean clothes to the hospital/surgery center.  FAILURE TO FOLLOW THESE INSTRUCTIONS MAY RESULT IN THE CANCELLATION OF YOUR SURGERY PATIENT SIGNATURE_________________________________  NURSE  SIGNATURE__________________________________  ________________________________________________________________________   Robert Bishop  An incentive spirometer is a tool that can help keep your lungs clear and active. This tool measures how well you are filling your lungs with each breath. Taking long deep breaths may help reverse or decrease the chance of developing breathing (pulmonary) problems (especially infection) following:  A long period of time when you are unable to move or be active. BEFORE THE PROCEDURE   If the spirometer includes an indicator to show your best effort, your nurse or respiratory therapist will set it to a desired goal.  If possible, sit up straight or lean slightly forward. Try not to slouch.  Hold the incentive spirometer in an upright position. INSTRUCTIONS FOR USE  1. Sit on the edge of your bed if possible, or sit up as far as you can in bed or on a chair. 2. Hold the incentive spirometer in an upright position. 3. Breathe out normally. 4. Place the mouthpiece in your mouth and seal your lips tightly around it. 5. Breathe in slowly and as deeply as possible, raising the piston or the ball toward the top of the column. 6. Hold your breath for 3-5 seconds or for  as long as possible. Allow the piston or ball to fall to the bottom of the column. 7. Remove the mouthpiece from your mouth and breathe out normally. 8. Rest for a few seconds and repeat Steps 1 through 7 at least 10 times every 1-2 hours when you are awake. Take your time and take a few normal breaths between deep breaths. 9. The spirometer may include an indicator to show your best effort. Use the indicator as a goal to work toward during each repetition. 10. After each set of 10 deep breaths, practice coughing to be sure your lungs are clear. If you have an incision (the cut made at the time of surgery), support your incision when coughing by placing a pillow or rolled up towels firmly  against it. Once you are able to get out of bed, walk around indoors and cough well. You may stop using the incentive spirometer when instructed by your caregiver.  RISKS AND COMPLICATIONS  Take your time so you do not get dizzy or light-headed.  If you are in pain, you may need to take or ask for pain medication before doing incentive spirometry. It is harder to take a deep breath if you are having pain. AFTER USE  Rest and breathe slowly and easily.  It can be helpful to keep track of a log of your progress. Your caregiver can provide you with a simple table to help with this. If you are using the spirometer at home, follow these instructions: [place] IF:   You are having difficultly using the spirometer.  You have trouble using the spirometer as often as instructed.  Your pain medication is not giving enough relief while using the spirometer.  You develop fever of 100.5 F (38.1 C) or higher. SEEK IMMEDIATE MEDICAL CARE IF:   You cough up bloody sputum that had not been present before.  You develop fever of 102 F (38.9 C) or greater.  You develop worsening pain at or near the incision site. MAKE SURE YOU:   Understand these instructions.  Will watch your condition.  Will get help right away if you are not doing well or get worse. Document Released: 06/25/2006 Document Revised: 05/07/2011 Document Reviewed: 08/26/2006 [facility] Patient Information 2014 [place], [place].   ________________________________________________________________________

## 2013-08-11 ENCOUNTER — Encounter (HOSPITAL_COMMUNITY): Payer: Self-pay

## 2013-08-11 ENCOUNTER — Ambulatory Visit (HOSPITAL_COMMUNITY)
Admission: RE | Admit: 2013-08-11 | Discharge: 2013-08-11 | Disposition: A | Discharge status: Home / Self Care | Payer: Medicare Other | Source: Ambulatory Visit | Attending: Anesthesiology | Admitting: Anesthesiology

## 2013-08-11 ENCOUNTER — Encounter (HOSPITAL_COMMUNITY)
Admission: RE | Admit: 2013-08-11 | Discharge: 2013-08-11 | Disposition: A | Discharge status: Home / Self Care | Payer: Medicare Other | Source: Ambulatory Visit | Attending: Urology | Admitting: Urology

## 2013-08-11 DIAGNOSIS — I10 Essential (primary) hypertension: Secondary | ICD-10-CM | POA: Diagnosis not present

## 2013-08-11 DIAGNOSIS — Z01818 Encounter for other preprocedural examination: Secondary | ICD-10-CM | POA: Insufficient documentation

## 2013-08-11 DIAGNOSIS — Z01810 Encounter for preprocedural cardiovascular examination: Secondary | ICD-10-CM | POA: Insufficient documentation

## 2013-08-11 DIAGNOSIS — Z01812 Encounter for preprocedural laboratory examination: Secondary | ICD-10-CM | POA: Insufficient documentation

## 2013-08-11 DIAGNOSIS — Z87891 Personal history of nicotine dependence: Secondary | ICD-10-CM | POA: Insufficient documentation

## 2013-08-11 DIAGNOSIS — N390 Urinary tract infection, site not specified: Secondary | ICD-10-CM | POA: Diagnosis not present

## 2013-08-11 LAB — URINE MICROSCOPIC-ADD ON

## 2013-08-11 LAB — BASIC METABOLIC PANEL
BUN: 18 mg/dL (ref 6–23)
CO2: 26 mEq/L (ref 19–32)
Calcium: 9.7 mg/dL (ref 8.4–10.5)
Chloride: 100 mEq/L (ref 96–112)
Creatinine, Ser: 1.06 mg/dL (ref 0.50–1.35)
GFR calc Af Amer: 81 mL/min — ABNORMAL LOW (ref >90)
GFR, EST NON AFRICAN AMERICAN: 70 mL/min — AB (ref >90)
GLUCOSE: 104 mg/dL — AB (ref 70–99)
Potassium: 4.4 mEq/L (ref 3.7–5.3)
SODIUM: 138 meq/L (ref 137–147)

## 2013-08-11 LAB — URINALYSIS, ROUTINE W REFLEX MICROSCOPIC
Bilirubin Urine: NEGATIVE
Glucose, UA: NEGATIVE mg/dL
KETONES UR: NEGATIVE mg/dL
NITRITE: NEGATIVE
PH: 7.5 (ref 5.0–8.0)
Protein, ur: NEGATIVE mg/dL
Specific Gravity, Urine: 1.025 (ref 1.005–1.030)
UROBILINOGEN UA: 1 mg/dL (ref 0.0–1.0)

## 2013-08-11 LAB — CBC
HCT: 46.1 % (ref 39.0–52.0)
Hemoglobin: 14.8 g/dL (ref 13.0–17.0)
MCH: 27.2 pg (ref 26.0–34.0)
MCHC: 32.1 g/dL (ref 30.0–36.0)
MCV: 84.7 fL (ref 78.0–100.0)
PLATELETS: 178 10*3/uL (ref 150–400)
RBC: 5.44 MIL/uL (ref 4.22–5.81)
RDW: 13.4 % (ref 11.5–15.5)
WBC: 4.8 10*3/uL (ref 4.0–10.5)

## 2013-08-11 NOTE — Progress Notes (Signed)
08/11/13 1010  OBSTRUCTIVE SLEEP APNEA  Do you have, or are you being treated for high blood pressure? 1  BMI more than 35 kg/m2? 1  Age over 69 years old? 1  Neck circumference greater than 40 cm/16 inches? 1  Gender: 1  Obstructive Sleep Apnea Score 5  Score 4 or greater  Results sent to PCP

## 2013-08-13 LAB — URINE CULTURE: Colony Count: >=100000

## 2013-08-17 MED ORDER — VANCOMYCIN HCL 10 G IV SOLR
1500.0000 mg | INTRAVENOUS | Status: AC
  Administered 2013-08-18: 1500 mg via INTRAVENOUS
  Filled 2013-08-17: qty 1500

## 2013-08-17 MED ORDER — GENTAMICIN SULFATE 40 MG/ML IJ SOLN
5.0000 mg/kg | INTRAVENOUS | Status: AC
  Administered 2013-08-18: 432 mg via INTRAVENOUS
  Filled 2013-08-17: qty 10.75

## 2013-08-17 NOTE — Anesthesia Preprocedure Evaluation (Addendum)
Anesthesia Evaluation  Patient identified by MRN, date of birth, ID band Patient awake    Reviewed: Allergy & Precautions, H&P , NPO status , Patient's Chart, lab work & pertinent test results  Airway Mallampati: III TM Distance: <3 FB Neck ROM: Full    Dental no notable dental hx. (+) Teeth Intact, Poor Dentition, Dental Advisory Given   Pulmonary neg pulmonary ROS, former smoker,  breath sounds clear to auscultation  Pulmonary exam normal       Cardiovascular hypertension, Pt. on medications Rhythm:Regular Rate:Normal     Neuro/Psych negative neurological ROS  negative psych ROS   GI/Hepatic Neg liver ROS, GERD-  Medicated,  Endo/Other  diabetes, Type 2, Oral Hypoglycemic AgentsMorbid obesity  Renal/GU negative Renal ROS   Prior radical prostatectomy negative genitourinary   Musculoskeletal negative musculoskeletal ROS (+)   Abdominal   Peds negative pediatric ROS (+)  Hematology negative hematology ROS (+) anemia ,   Anesthesia Other Findings   Reproductive/Obstetrics negative OB ROS                          Anesthesia Physical Anesthesia Plan  ASA: III  Anesthesia Plan: General   Post-op Pain Management:    Induction: Intravenous  Airway Management Planned: Oral ETT  Additional Equipment:   Intra-op Plan:   Post-operative Plan: Extubation in OR  Informed Consent: I have reviewed the patients History and Physical, chart, labs and discussed the procedure including the risks, benefits and alternatives for the proposed anesthesia with the patient or authorized representative who has indicated his/her understanding and acceptance.   Dental advisory given  Plan Discussed with: CRNA  Anesthesia Plan Comments:         Anesthesia Quick Evaluation

## 2013-08-17 NOTE — H&P (Signed)
tive Problems Problems   1. Erectile dysfunction due to arterial insufficiency (607.84)  2. History of malignant neoplasm of prostate (V10.46)  3. Microscopic hematuria (599.72)  4. Muscle spasm (728.85)  5. Muscle weakness (728.87)  6. Other lack of coordination (781.3)  History of Present Illness   Robert Bishop returns today in f/u from [facility] with nodes on 08/06/12.   He had p2c N0 Gleason 7(3+4) disease.  His PSA is <0.01.    He has good continence and soesn't require pads.  He has preexisting ED but he has no function now.  He had not responded to Viagra in the past. He is interested in considering other options.  He has no associated signs or symptoms.   Past Medical History Problems   1. History of Arthritis (V13.4)  2. History of Elevated prostate specific antigen (PSA) (790.93)  3. History of diabetes mellitus (V12.29)  4. History of hypercholesterolemia (V12.29)  5. History of hypertension (V12.59)  6. History of malignant neoplasm of prostate (V10.46)  7. History of Peptic Ulcer (V12.71)  8. History of Urge and stress incontinence (788.33)  9. History of Urinary urgency (788.63)  Surgical History Problems   1. History of Cystoscopy With Biopsy  2. History of Cystoscopy With Fulguration Minor Lesion (Under 84mm)  3. History of Cystoscopy With Fulguration Small Lesion (5-27mm)  4. History of Laparoscopy With Bilateral Total Pelvic Lymphadenectomy  5. History of Prostatect Retropubic Radical W/ Nerve Sparing Laparoscopic  6. History of Reported Hx Of Surgery For An Ulcer  Current Meds  1. Aspirin 81 MG Oral Tablet Chewable;  Therapy: (Recorded:28Feb2014) to Recorded  2. Hydrochlorothiazide 25 MG Oral Tablet;  Therapy: (Recorded:28Feb2014) to Recorded  3. MetFORMIN HCl ER 500 MG Oral Tablet Extended Release 24 Hour;  Therapy: (Recorded:28Feb2014) to Recorded  4. Multi-Vitamin TABS;  Therapy: (Recorded:27May2015) to Recorded  5. Simvastatin 10 MG Oral Tablet;  Therapy:  (Recorded:28Feb2014) to Recorded  Allergies Medication   1. No Known Drug Allergies  Family History Problems   1. Family history of Death In The Family Father   age 66 ?  2. Family history of Death In The Family Mother   age 39  3. Family history of Family Health Status Number Of Children   2 sons 2 daughters  Social History Problems   1. Caffeine Use   2 p/d  2. Exercise Habits   Patient currently does not exercise.  3. Former smoker (V15.82)   1 pk. p/d for 35 yrs. quit 25 yrs.  4. Living Independently With Spouse  5. Marital History - Currently Married  6. Never Drank Alcohol  7. Occupation: Retired  34. Retired From Work   Past and social history reviewed and updated.   Review of Systems  Constitutional: no recent weight loss.  Cardiovascular: no chest pain.  Respiratory: no shortness of breath.  Musculoskeletal: no bone pain.    Vitals Vital Signs [Data Includes: Last 1 Day]  Recorded: [id number] 10:48AM  Blood Pressure: 118 / 78 Temperature: 97.6 F Heart Rate: 65  Physical Exam Constitutional: Well nourished and well developed . No acute distress.  ENT:. The ears and nose are normal in appearance.  Neck: The appearance of the neck is normal and no neck mass is present.  Pulmonary: No respiratory distress and normal respiratory rhythm and effort.  Cardiovascular: Heart rate and rhythm are normal . No peripheral edema.  Abdomen: Incision site(s) well healed. The abdomen is soft and nontender. No masses are palpated. No  CVA tenderness. No hernias are palpable. No hepatosplenomegaly noted.  Rectal: Rectal exam demonstrates normal sphincter tone, no tenderness and no masses. The prostate is absent. The perineum is normal on inspection.  Genitourinary: Examination of the penis demonstrates no discharge, no masses, no lesions and a normal meatus. The penis is uncircumcised. The scrotum is without lesions. The right epididymis is palpably normal and non-tender.  The left epididymis is palpably normal and non-tender. The right testis is non-tender and without masses. The left testis is non-tender and without masses.  Lymphatics: The femoral and inguinal nodes are not enlarged or tender.  Skin: Normal skin turgor, no visible rash and no visible skin lesions.  Neuro/Psych:. Mood and affect are appropriate. Normal sensation of the perineum/perianal region (S3,4,5).    Results/Data  The following clinical lab reports were reviewed:  UA has 3-6 RBC's and WBC's with many bacteria and is nit +. PSA remains undetectible. PSA Flowsheet [Data Includes: All]   Goal [id number]  Free PSA      % Free PSA      PSA  <0.01 ng/mL <0.01 ng/mL <0.01 ng/mL  Assessment Assessed   1. History of malignant neoplasm of prostate (V10.46)  2. Erectile dysfunction due to arterial insufficiency (607.84)  3. Urinary tract infection (599.0)   His PSA is undetectable and he has good continence but he appears to have a UTI.  He has persistent ED and is interested in considering an IPP.   Plan  Erectile dysfunction due to arterial insufficiency   1. Follow-up Schedule Surgery Office  Follow-up  Status: Hold For - Appointment   Requested for: [id number] Health Maintenance   2. UA With REFLEX; [Do Not Release]; Status:In Progress - Specimen/Data Collected;    Done: [id number]       Urine culture today and I will treat if positive.  He is interested in having a penile prosthesis placed.   I have reviewed the risks of bleeding, infection, injury to the urethra and bladder, chronic pain, malfunction with need for revision, erosion, thrombotic events and anesthetic complications.   Discussion/Summary  CC: Dr. Dalia Heading.    His culture was negative and he was treated with bactrim.  A repeat culture remained positive and he is back on bactrim and will be maintained on that through the post op period and then will be kept on suppression for a couple of months.

## 2013-08-18 ENCOUNTER — Encounter (HOSPITAL_COMMUNITY): Payer: Medicare Other | Admitting: Anesthesiology

## 2013-08-18 ENCOUNTER — Encounter (HOSPITAL_COMMUNITY): Payer: Self-pay | Admitting: *Deleted

## 2013-08-18 ENCOUNTER — Observation Stay (HOSPITAL_COMMUNITY)
Admission: RE | Admit: 2013-08-18 | Discharge: 2013-08-19 | Disposition: A | Discharge status: Home / Self Care | Payer: Medicare Other | Source: Ambulatory Visit | Attending: Urology | Admitting: Urology

## 2013-08-18 ENCOUNTER — Encounter (HOSPITAL_COMMUNITY)
Admission: RE | Disposition: A | Discharge status: Home / Self Care | Payer: Self-pay | Source: Ambulatory Visit | Attending: Urology

## 2013-08-18 ENCOUNTER — Ambulatory Visit (HOSPITAL_COMMUNITY): Payer: Medicare Other | Admitting: Anesthesiology

## 2013-08-18 DIAGNOSIS — Z87891 Personal history of nicotine dependence: Secondary | ICD-10-CM | POA: Diagnosis not present

## 2013-08-18 DIAGNOSIS — Z8546 Personal history of malignant neoplasm of prostate: Secondary | ICD-10-CM | POA: Insufficient documentation

## 2013-08-18 DIAGNOSIS — Z7982 Long term (current) use of aspirin: Secondary | ICD-10-CM | POA: Diagnosis not present

## 2013-08-18 DIAGNOSIS — E119 Type 2 diabetes mellitus without complications: Secondary | ICD-10-CM | POA: Insufficient documentation

## 2013-08-18 DIAGNOSIS — K219 Gastro-esophageal reflux disease without esophagitis: Secondary | ICD-10-CM | POA: Diagnosis not present

## 2013-08-18 DIAGNOSIS — I10 Essential (primary) hypertension: Secondary | ICD-10-CM | POA: Insufficient documentation

## 2013-08-18 DIAGNOSIS — Z9079 Acquired absence of other genital organ(s): Secondary | ICD-10-CM | POA: Insufficient documentation

## 2013-08-18 DIAGNOSIS — D649 Anemia, unspecified: Secondary | ICD-10-CM | POA: Diagnosis not present

## 2013-08-18 DIAGNOSIS — Z79899 Other long term (current) drug therapy: Secondary | ICD-10-CM | POA: Diagnosis not present

## 2013-08-18 DIAGNOSIS — N529 Male erectile dysfunction, unspecified: Secondary | ICD-10-CM | POA: Diagnosis not present

## 2013-08-18 DIAGNOSIS — E7800 Pure hypercholesterolemia, unspecified: Secondary | ICD-10-CM | POA: Insufficient documentation

## 2013-08-18 DIAGNOSIS — N390 Urinary tract infection, site not specified: Secondary | ICD-10-CM | POA: Diagnosis not present

## 2013-08-18 DIAGNOSIS — N5231 Erectile dysfunction following radical prostatectomy: Secondary | ICD-10-CM | POA: Diagnosis present

## 2013-08-18 HISTORY — PX: PENILE PROSTHESIS IMPLANT: SHX240

## 2013-08-18 LAB — GLUCOSE, CAPILLARY
GLUCOSE-CAPILLARY: 119 mg/dL — AB (ref 70–99)
GLUCOSE-CAPILLARY: 149 mg/dL — AB (ref 70–99)
GLUCOSE-CAPILLARY: 169 mg/dL — AB (ref 70–99)
Glucose-Capillary: 113 mg/dL — ABNORMAL HIGH (ref 70–99)
Glucose-Capillary: 213 mg/dL — ABNORMAL HIGH (ref 70–99)

## 2013-08-18 SURGERY — INSERTION, PENILE PROSTHESIS
Anesthesia: General

## 2013-08-18 MED ORDER — DEXAMETHASONE SODIUM PHOSPHATE 10 MG/ML IJ SOLN
INTRAMUSCULAR | Status: AC
  Filled 2013-08-18: qty 1

## 2013-08-18 MED ORDER — ADULT MULTIVITAMIN W/MINERALS CH
1.0000 | ORAL_TABLET | Freq: Every day | ORAL | Status: DC
  Administered 2013-08-18 – 2013-08-19 (×2): 1 via ORAL
  Filled 2013-08-18 (×2): qty 1

## 2013-08-18 MED ORDER — PROPOFOL 10 MG/ML IV BOLUS
INTRAVENOUS | Status: DC | PRN
  Administered 2013-08-18: 150 mg via INTRAVENOUS
  Administered 2013-08-18: 50 mg via INTRAVENOUS

## 2013-08-18 MED ORDER — FENTANYL CITRATE 0.05 MG/ML IJ SOLN
INTRAMUSCULAR | Status: AC
  Filled 2013-08-18: qty 5

## 2013-08-18 MED ORDER — DIPHENHYDRAMINE HCL 50 MG/ML IJ SOLN: 12.5000 mg | Freq: Four times a day (QID) | INTRAMUSCULAR | Status: DC | PRN

## 2013-08-18 MED ORDER — BISACODYL 10 MG RE SUPP: 10.0000 mg | Freq: Every day | RECTAL | Status: DC | PRN

## 2013-08-18 MED ORDER — ONDANSETRON HCL 4 MG/2ML IJ SOLN
INTRAMUSCULAR | Status: AC
  Filled 2013-08-18: qty 2

## 2013-08-18 MED ORDER — MIDAZOLAM HCL 5 MG/5ML IJ SOLN
INTRAMUSCULAR | Status: DC | PRN
  Administered 2013-08-18 (×2): 1 mg via INTRAVENOUS

## 2013-08-18 MED ORDER — METOCLOPRAMIDE HCL 5 MG/ML IJ SOLN
INTRAMUSCULAR | Status: DC | PRN
  Administered 2013-08-18 (×2): 5 mg via INTRAVENOUS

## 2013-08-18 MED ORDER — MEPERIDINE HCL 50 MG/ML IJ SOLN: 6.2500 mg | INTRAMUSCULAR | Status: DC | PRN

## 2013-08-18 MED ORDER — HYDROMORPHONE HCL PF 1 MG/ML IJ SOLN
0.5000 mg | INTRAMUSCULAR | Status: DC | PRN
Start: 2013-08-18 — End: 2013-08-19

## 2013-08-18 MED ORDER — DEXAMETHASONE SODIUM PHOSPHATE 4 MG/ML IJ SOLN
INTRAMUSCULAR | Status: DC | PRN
  Administered 2013-08-18: 10 mg via INTRAVENOUS

## 2013-08-18 MED ORDER — PROPOFOL 10 MG/ML IV BOLUS
INTRAVENOUS | Status: AC
  Filled 2013-08-18: qty 20

## 2013-08-18 MED ORDER — HYDROCHLOROTHIAZIDE 25 MG PO TABS
25.0000 mg | ORAL_TABLET | Freq: Every day | ORAL | Status: DC
  Administered 2013-08-18: 25 mg via ORAL
  Filled 2013-08-18 (×2): qty 1

## 2013-08-18 MED ORDER — KETAMINE HCL 10 MG/ML IJ SOLN
INTRAMUSCULAR | Status: AC
  Filled 2013-08-18: qty 1

## 2013-08-18 MED ORDER — PROMETHAZINE HCL 25 MG/ML IJ SOLN: 6.2500 mg | INTRAMUSCULAR | Status: DC | PRN

## 2013-08-18 MED ORDER — SIMVASTATIN 10 MG PO TABS
10.0000 mg | ORAL_TABLET | Freq: Every day | ORAL | Status: DC
  Administered 2013-08-18: 10 mg via ORAL
  Filled 2013-08-18 (×2): qty 1

## 2013-08-18 MED ORDER — HYDRALAZINE HCL 20 MG/ML IJ SOLN
INTRAMUSCULAR | Status: AC
  Filled 2013-08-18: qty 1

## 2013-08-18 MED ORDER — FENTANYL CITRATE 0.05 MG/ML IJ SOLN
INTRAMUSCULAR | Status: AC
  Filled 2013-08-18: qty 2

## 2013-08-18 MED ORDER — MIDAZOLAM HCL 2 MG/2ML IJ SOLN
INTRAMUSCULAR | Status: AC
  Filled 2013-08-18: qty 2

## 2013-08-18 MED ORDER — HYDRALAZINE HCL 20 MG/ML IJ SOLN
INTRAMUSCULAR | Status: DC | PRN
  Administered 2013-08-18 (×2): 2 mg via INTRAVENOUS

## 2013-08-18 MED ORDER — FENTANYL CITRATE 0.05 MG/ML IJ SOLN
INTRAMUSCULAR | Status: DC | PRN
  Administered 2013-08-18: 50 ug via INTRAVENOUS
  Administered 2013-08-18: 100 ug via INTRAVENOUS
  Administered 2013-08-18 (×2): 50 ug via INTRAVENOUS

## 2013-08-18 MED ORDER — ACETAMINOPHEN 325 MG PO TABS: 650.0000 mg | ORAL_TABLET | ORAL | Status: DC | PRN

## 2013-08-18 MED ORDER — FENTANYL CITRATE 0.05 MG/ML IJ SOLN
25.0000 ug | INTRAMUSCULAR | Status: DC | PRN
  Administered 2013-08-18 (×2): 25 ug via INTRAVENOUS

## 2013-08-18 MED ORDER — KETAMINE HCL 10 MG/ML IJ SOLN
INTRAMUSCULAR | Status: DC | PRN
  Administered 2013-08-18: 50 mg via INTRAVENOUS

## 2013-08-18 MED ORDER — DIPHENHYDRAMINE HCL 12.5 MG/5ML PO ELIX: 12.5000 mg | ORAL_SOLUTION | Freq: Four times a day (QID) | ORAL | Status: DC | PRN

## 2013-08-18 MED ORDER — LACTATED RINGERS IV SOLN
INTRAVENOUS | Status: DC | PRN
  Administered 2013-08-18 (×2): via INTRAVENOUS

## 2013-08-18 MED ORDER — INSULIN ASPART 100 UNIT/ML ~~LOC~~ SOLN
0.0000 [IU] | Freq: Three times a day (TID) | SUBCUTANEOUS | Status: DC
  Administered 2013-08-18: 3 [IU] via SUBCUTANEOUS

## 2013-08-18 MED ORDER — METFORMIN HCL ER 500 MG PO TB24
500.0000 mg | ORAL_TABLET | Freq: Every day | ORAL | Status: DC
  Administered 2013-08-19: 500 mg via ORAL
  Filled 2013-08-18 (×2): qty 1

## 2013-08-18 MED ORDER — METOCLOPRAMIDE HCL 5 MG/ML IJ SOLN
INTRAMUSCULAR | Status: AC
  Filled 2013-08-18: qty 2

## 2013-08-18 MED ORDER — POLYMYXIN B SULFATE 500000 UNITS IJ SOLR
INTRAMUSCULAR | Status: DC | PRN
  Administered 2013-08-18: 08:00:00

## 2013-08-18 MED ORDER — OXYCODONE HCL 5 MG PO TABS
5.0000 mg | ORAL_TABLET | ORAL | Status: DC | PRN
  Administered 2013-08-18 (×2): 5 mg via ORAL
  Filled 2013-08-18 (×2): qty 1

## 2013-08-18 MED ORDER — SUCCINYLCHOLINE CHLORIDE 20 MG/ML IJ SOLN
INTRAMUSCULAR | Status: DC | PRN
  Administered 2013-08-18: 100 mg via INTRAVENOUS

## 2013-08-18 MED ORDER — ONDANSETRON HCL 4 MG/2ML IJ SOLN: 4.0000 mg | INTRAMUSCULAR | Status: DC | PRN

## 2013-08-18 MED ORDER — DOCUSATE SODIUM 100 MG PO CAPS
100.0000 mg | ORAL_CAPSULE | Freq: Two times a day (BID) | ORAL | Status: DC
  Administered 2013-08-18 – 2013-08-19 (×3): 100 mg via ORAL
  Filled 2013-08-18 (×4): qty 1

## 2013-08-18 MED ORDER — POTASSIUM CHLORIDE IN NACL 20-0.45 MEQ/L-% IV SOLN
INTRAVENOUS | Status: DC
Start: 2013-08-18 — End: 2013-08-19
  Administered 2013-08-18 – 2013-08-19 (×3): via INTRAVENOUS
  Filled 2013-08-18 (×5): qty 1000

## 2013-08-18 MED ORDER — SULFAMETHOXAZOLE-TMP DS 800-160 MG PO TABS
1.0000 | ORAL_TABLET | Freq: Two times a day (BID) | ORAL | Status: DC
  Administered 2013-08-18 – 2013-08-19 (×2): 1 via ORAL
  Filled 2013-08-18 (×3): qty 1

## 2013-08-18 MED ORDER — SODIUM CHLORIDE 0.9 % IR SOLN
Status: DC | PRN
  Administered 2013-08-18: 08:00:00

## 2013-08-18 MED ORDER — ONDANSETRON HCL 4 MG/2ML IJ SOLN
INTRAMUSCULAR | Status: DC | PRN
  Administered 2013-08-18: 4 mg via INTRAVENOUS

## 2013-08-18 MED ORDER — HYOSCYAMINE SULFATE 0.125 MG SL SUBL
0.1250 mg | SUBLINGUAL_TABLET | SUBLINGUAL | Status: DC | PRN
  Filled 2013-08-18: qty 1

## 2013-08-18 SURGICAL SUPPLY — 57 items
BAG URINE DRAINAGE (UROLOGICAL SUPPLIES) ×3 IMPLANT
BANDAGE COBAN STERILE 2 (GAUZE/BANDAGES/DRESSINGS) IMPLANT
BENZOIN TINCTURE PRP APPL 2/3 (GAUZE/BANDAGES/DRESSINGS) IMPLANT
BLADE HEX COATED 2.75 (ELECTRODE) ×3 IMPLANT
BLADE SURG 15 STRL LF DISP TIS (BLADE) ×2 IMPLANT
BLADE SURG 15 STRL SS (BLADE) ×4
CANISTER SUCTION 2500CC (MISCELLANEOUS) IMPLANT
CATH FOLEY 2WAY SLVR  5CC 16FR (CATHETERS) ×2
CATH FOLEY 2WAY SLVR 5CC 16FR (CATHETERS) ×1 IMPLANT
CATH URET 5FR 28IN OPEN ENDED (CATHETERS) IMPLANT
CLOSURE WOUND 1/2 X4 (GAUZE/BANDAGES/DRESSINGS)
COVER MAYO STAND STRL (DRAPES) ×3 IMPLANT
COVER SURGICAL LIGHT HANDLE (MISCELLANEOUS) ×3 IMPLANT
DERMABOND ADVANCED (GAUZE/BANDAGES/DRESSINGS) ×2
DERMABOND ADVANCED .7 DNX12 (GAUZE/BANDAGES/DRESSINGS) ×1 IMPLANT
DISSECTOR ROUND CHERRY 3/8 STR (MISCELLANEOUS) ×3 IMPLANT
DRAIN CHANNEL 15F RND FF 3/16 (WOUND CARE) ×3 IMPLANT
DRAPE LAPAROTOMY T 102X78X121 (DRAPES) ×3 IMPLANT
DRAPE UTILITY XL STRL (DRAPES) ×3 IMPLANT
DRSG TEGADERM 4X4.75 (GAUZE/BANDAGES/DRESSINGS) ×3 IMPLANT
ELECT REM PT RETURN 9FT ADLT (ELECTROSURGICAL) ×3
ELECTRODE REM PT RTRN 9FT ADLT (ELECTROSURGICAL) ×1 IMPLANT
EVACUATOR SILICONE 100CC (DRAIN) ×3 IMPLANT
GAUZE SPONGE 4X4 12PLY STRL (GAUZE/BANDAGES/DRESSINGS) IMPLANT
GLOVE SURG SS PI 8.0 STRL IVOR (GLOVE) IMPLANT
GOWN STRL REUS W/TWL XL LVL3 (GOWN DISPOSABLE) ×6 IMPLANT
KIT BASIN OR (CUSTOM PROCEDURE TRAY) ×3 IMPLANT
KIT TITAN ASSEMBLY (Erectile Restoration) ×2 IMPLANT
KIT TITAN ASSEMBLY STANDARD (Erectile Restoration) ×1 IMPLANT
KIT TITAN ASSEMBLY STD (Erectile Restoration) ×1 IMPLANT
LUBRICANT JELLY ST 5GR 8946 (MISCELLANEOUS) IMPLANT
NS IRRIG 1000ML POUR BTL (IV SOLUTION) IMPLANT
PACK GENERAL/GYN (CUSTOM PROCEDURE TRAY) ×3 IMPLANT
PLUG CATH AND CAP STER (CATHETERS) ×3 IMPLANT
PROS TITAN INFRA 0 ANG 20CM (Erectile Restoration) ×3 IMPLANT
PROSTHESIS TTN INFR 0 ANG 20CM (Erectile Restoration) ×1 IMPLANT
RESERVOIR TITAN 12.5CC W/VLV ×3 IMPLANT
RETRACTOR WILSON SYSTEM (INSTRUMENTS) IMPLANT
SOL PREP POV-IOD 16OZ 10% (MISCELLANEOUS) ×3 IMPLANT
SOL PREP PROV IODINE SCRUB 4OZ (MISCELLANEOUS) ×3 IMPLANT
SPONGE DRAIN TRACH 4X4 STRL 2S (GAUZE/BANDAGES/DRESSINGS) ×3 IMPLANT
SPONGE LAP 4X18 X RAY DECT (DISPOSABLE) IMPLANT
STRIP CLOSURE SKIN 1/2X4 (GAUZE/BANDAGES/DRESSINGS) IMPLANT
SUPPORT SCROTAL LG STRP (MISCELLANEOUS) ×2 IMPLANT
SUPPORTER ATHLETIC LG (MISCELLANEOUS) ×1
SUT CHROMIC 3 0 SH 27 (SUTURE) ×6 IMPLANT
SUT MNCRL AB 4-0 PS2 18 (SUTURE) ×3 IMPLANT
SUT PDS AB 2-0 CT2 27 (SUTURE) IMPLANT
SUT VIC AB 2-0 UR6 27 (SUTURE) ×12 IMPLANT
SUT VIC AB 3-0 SH 27 (SUTURE) ×2
SUT VIC AB 3-0 SH 27X BRD (SUTURE) ×1 IMPLANT
SUT VICRYL 0 27 CT2 27 ABS (SUTURE) ×3 IMPLANT
SYRINGE 20CC LL (MISCELLANEOUS) ×3 IMPLANT
SYRINGE 60CC LL (MISCELLANEOUS) ×6 IMPLANT
TOWEL OR 17X26 10 PK STRL BLUE (TOWEL DISPOSABLE) ×3 IMPLANT
TOWEL OR NON WOVEN STRL DISP B (DISPOSABLE) ×3 IMPLANT
WATER STERILE IRR 1500ML POUR (IV SOLUTION) IMPLANT

## 2013-08-18 NOTE — Discharge Instructions (Signed)
Penile Prosthesis Implantation Penile prosthesis implantation is a procedure to implant semirigid or inflatable devices into the penis as a treatment for erectile dysfunction. There are two main types of implants used for the surgery.  Malleable penile implant (also known as nonhydraulic or semirigid implant).  This consists of two silicone rubber rods.  These help by providing a certain amount of rigidity.  They are also flexible, so the penis can either be curved downward in the normal position or put into an erect position for intercourse.  Inflatable penile implant (also known as hydraulic implant).  This consists of cylinders, a pump, and a reservoir.  There are different types of inflatable implants. Each has advantages and disadvantages.  The cylinders can be inflated with fluid that helps in having an erection and can then be deflated after intercourse. LET [facility] CARE PROVIDER KNOW ABOUT:  Any allergies that you have.  All medicines you are taking, including vitamins, herbs, eye drops, creams, and over-the-counter medicines.  Previous problems you or members of your family have had with the use of anesthetics.  Any blood disorders you have.  Previous surgeries you have had.  Medical conditions you have. RISKS AND COMPLICATIONS  Generally, penile prosthesis implantation is a safe procedure. However, as with any procedure, complications can occur. Possible complications include:  Your urethra may be injured during the procedure.  You may develop an infection in your penis. Your prosthesis may need to be removed to treat the infection.  In very rare cases, the blood flow to your penis may become compromised, and the prosthesis would need to be removed. In people with diabetes mellitus, this may result in partial loss of penile tissue. BEFORE THE PROCEDURE   You may be asked to shower with a special soap the night or morning before surgery.  You may be given  intravenous antibiotics one hour before the surgery.  Hair will be removed from your surgical site and the site will be thoroughly cleansed.  You may be given regional anesthetic or general anesthetic.  On the day of the surgery, a Foley catheter (a soft, thin, flexible rubber tube) may be passed through the urethra (urinary passage) into the bladder.  The tube will drain urine.  The catheter will also help in guiding your surgeon to identify the urethra during the procedure. PROCEDURE  A small incision is made in the scrotum or in the penis just below the head.  The cylinders of the prosthesis are inserted by the surgeon into the erectile tissue of the penis.  When an inflatable prosthesis is inserted, additional small incisions are made in the abdomen and in the scrotum to insert the pump and the reservoir of the inflatable device.  The cylinders, reservoir (filled with fluid), and pump will be joined by tubes and tested before your incisions are closed.  The incisions are closed using dissolvable sutures. AFTER THE PROCEDURE   After the procedure, you will be taken to the recovery area, where a nurse will watch you and check your progress. Once you are awake, stable, and taking fluids well, without other problems, you will be allowed to go home.  The surgeon may prescribe medicines to ease pain.  You may also be given antibiotics.  You may be put on a clear liquid diet for the first 24 hours.  You may be asked to walk or sit instead of lying down. It is helpful to avoid long periods of immobility.  A towel roll may be  placed under your scrotum to help reduce swelling.  The Foley catheter may be removed in the morning after surgery.  A sterile dressing will be applied to the incision site. You may have a scrotal support. This elevates the scrotum, relieving pressure on the incision site. In those cases in which the scrotal support irritates the incision site, you may remove  the support. It is okay if the dressing comes off, especially at night. Air will help a scab to form, which will eliminate the need for dressings during the day. Document Released: 05/22/2007 Document Revised: 10/15/2012 Document Reviewed: 07/16/2012 [facility] Patient Information 2015 [place], [place]. This information is not intended to replace advice given to you by your health care provider. Make sure you discuss any questions you have with your health care provider.

## 2013-08-18 NOTE — Progress Notes (Signed)
Patient ID: Robert Bishop, male   DOB: 21-May-1945, 68 y.o.   MRN: [id number]  Doing well post op.  Has catheter irritation but no other complaints.

## 2013-08-18 NOTE — Op Note (Signed)
Robert Bishop, HYMES NO.:  [id number]  MEDICAL RECORD NO.:  [id number]  LOCATION:  1406                         FACILITY:  [facility]  PHYSICIAN:  Marshall Cork. Jeffie Pollock, M.D.    DATE OF BIRTH:  May 17, 1945  DATE OF PROCEDURE:  08/18/2013 DATE OF DISCHARGE:                              OPERATIVE REPORT   PROCEDURE:  Implantation of Titan multicomponent inflatable penile prosthesis.  PREOPERATIVE DIAGNOSIS:  Post-prostatectomy erectile dysfunction.  POSTOPERATIVE DIAGNOSIS:  Post-prostatectomy erectile dysfunction.  SURGEON:  Lenton Gendreau. Jeffie Pollock, M.D.  ASSISTANT:  None.  ANESTHESIA:  General.  DRAINS:  A 15-French Jackson-Pratt wound drain and 16-French Foley catheter.  ESTIMATED BLOOD LOSS:  50 mL.  COMPLICATIONS:  None.  INDICATIONS:  Mr. Amparan is a 68 year old white male with a history of prostate cancer, treated with radical prostatectomy in June 2014.  He has persistent postop erectile dysfunction.  It has failed conservative measures and he has now elected to have a penile prosthesis placed.  FINDINGS OF PROCEDURE:  He has been on Bactrim preoperatively.  He was taken to the operating room where he was given vancomycin and gentamicin per pharmacy dosing.  A general anesthetic was induced.  His lower abdomen and genitalia were clipped.  He was then prepped with a 5-minute Betadine scrub followed by Betadine prep and draped in usual sterile fashion.  Time-out was performed.  A 16-French Foley catheter was dipped in antibiotic solution, lubricated, and passed to the bladder.  The balloon was filled with 10 mL of sterile fluid and the bladder was drained.  The catheter was then plugged.  An infrapubic incision was made approximately 6 cm in length with a knife.  This was carried down through the subcutaneous tissues with the Bovie.  The anterior rectus fascia was exposed and a very lower midline incision was made, allowing entry into the rectus sheath.  Because  of his prostatectomy, I did not enter the space of Retzius, but stayed above the posterior rectus fascia beneath the muscle and created an adequate pocket for placement of the reservoir.  Antibiotic solution was then instilled.  The right and left corpora were then exposed with being taken to avoid midline structures.  Stay sutures were placed in the anterolateral corpora bilaterally using 2-0 Vicryl.  A knife was then used to make a left corporotomy approximately 2 cm in length.  The Metzenbaum were used to initiate the dilation tract and then Mentor offset dilators were used to violate the corpora up to 12 mm.  Once dilation was completed, measurement was performed with an 8.5-mm proximal and 12.5-mm distal measurement.  The corpora was then irrigated with antibiotic solution.  This procedure was then repeated on the left side with similar measurements.  At this point, a 125-mm reservoir was chosen along with a 20-cm prosthetic cylinder kit with 1 cm rear-tip extenders applied bilaterally.  At this point, while the cylinders were being prepped, the reservoir was inserted into the previously created pocket, it passed without difficulty.  The reservoir was then filled with 75 mL of sterile saline. There was no backflow.  The palpation revealed good position.  No kink in the reservoir.  The  anterior rectus fascia was then closed using interrupted running 0 Vicryl suture, and the reservoir tubing was placed off to the side.  The cylinders were then brought back onto the field which had been liberally irrigated with antibiotic solution again and clean towel was placed over the lower abdomen to provide a barrier from the prior incision surgical site.  The right cylinder tethering suture was then placed through a [place] needle, inserted in the [place] inserter, and this was then passed through the distal corporotomy until it was palpable beneath the glans.  The needle was passed  through the glans, grasped, and removed, and a hemostat was placed on the suture.  This was then repeated on the left side in a similar fashion.  Care was taken to avoid the urethra during this procedure.  The Foley was tucked slightly.  No blood was noted on the Foley proximal to meatus.  The right cylinder was then started down the distal corpora and then seated proximally and pulled into position with no significant buckling noted.  This was then repeated on the right side.  Once both cylinders were in position, a saline-filled syringe was used to fill the device.  A total of 75 mL was required to fill the device adequately.  Palpation of the corporotomy has revealed smooth cylinders in this area.  The device was then deflated back to approximately 15 mL which is a very flaccid state and corporotomies were then closed with interrupted 2-0 Vicryl suture using the cylinder protector to avoid injury to the components.  Once both corporotomies were closed, a subdartos pouch was created in the right hemiscrotum using a sponge stick.  Once the pouch had been completed and the patch irrigated with antibiotic solution, the pump was then placed into the subdartos pouch and secured using a Babcock.  The reservoir pump and tubing were then trimmed to appropriate length. The tubing was irrigated after the connectors were placed and were connected using a straight Quick Connect.  Once the device was connected, it was cycled again with excellent function.  However, with the cylinders full, there was still some oozing from the right corpora.  At this point, decision was made to place a Jackson-Pratt drain.  A 15- mm round Jackson-Pratt drain was then brought onto the field.  It was trimmed to length and placed down into the scrotal pouch and across the bed of the incision.  This was brought out through separate stab wound to the left of the incision.  The subcutaneous tissues were  reapproximated with interrupted 3-0 Vicryl.  The wound was cleansed and the skin was closed using a running intracuticular 4-0 Monocryl.  The wound was then reinforced with Dermabond.  The drain was secured with a 3-0 nylon suture.  The tubing was trimmed to length and connected to bulb suction.  The bladder was drained and the catheter was placed to straight drainage.  Dressing of 4x4s and fluffs was applied along with a scrotal support.  The device was left moderately inflated to provide additional hemostasis.  At this point, the patient's anesthetic was reversed and he was moved to the recovery room in stable condition.  There were no complications. The component information includes a Titan patient assembly kit, catalog [phone], batch code [id number]; a Titan CL reservoir 125 mL volume, catalog [phone], batch code [id number], with a total of 90 mL of fluid in the device and a Titan touch infrapubic 0-degree angle cylinder set with pump 20 cm with  1-cm rear-tip extenders, catalog [phone], batch code [id number].  There were no complications.     Marshall Cork. Jeffie Pollock, M.D.     JJW/MEDQ  D:  08/18/2013  T:  08/18/2013  Job:  [id number]

## 2013-08-18 NOTE — Interval H&P Note (Signed)
History and Physical Interval Note:  He is on bactrim for a recent e. Coli UTI and will be kept on that post op  08/18/2013 7:16 AM  Narda Rutherford  has presented today for surgery, with the diagnosis of ERECTILE DYSFUNCTION  The various methods of treatment have been discussed with the patient and family. After consideration of risks, benefits and other options for treatment, the patient has consented to  Procedure(s): INSERTION OF Accord (N/A) as a surgical intervention .  The patient's history has been reviewed, patient examined, no change in status, stable for surgery.  I have reviewed the patient's chart and labs.  Questions were answered to the patient's satisfaction.     WRENN,Maris J

## 2013-08-18 NOTE — Brief Op Note (Signed)
08/18/2013  9:36 AM  PATIENT:  Robert Bishop  68 y.o. male  PRE-OPERATIVE DIAGNOSIS:  ERECTILE DYSFUNCTION  POST-OPERATIVE DIAGNOSIS:  ERECTILE DYSFUNCTION  PROCEDURE:  Procedure(s): INSERTION OF COLOPLAST TITAN PENILE PROSTHESIS INFRAPUBIC APPROACH (N/A)  SURGEON:  Surgeon(s) and Role:    * Malka So, MD - Primary  PHYSICIAN ASSISTANT:   ASSISTANTS: none   ANESTHESIA:   general  EBL:  Total I/O In: 1000 [I.V.:1000] Out: -   BLOOD ADMINISTERED:none  DRAINS: (51fr) Jackson-Pratt drain(s) with closed bulb suction in the infrapubic space   LOCAL MEDICATIONS USED:  NONE  SPECIMEN:  No Specimen  DISPOSITION OF SPECIMEN:  N/A  COUNTS:  YES  TOURNIQUET:  * No tourniquets in log *  DICTATION: .Other Dictation: Dictation Number [id number]  PLAN OF CARE: Admit for overnight observation  PATIENT DISPOSITION:  PACU - hemodynamically stable.   Delay start of Pharmacological VTE agent (>24hrs) due to surgical blood loss or risk of bleeding: not applicable

## 2013-08-18 NOTE — Care Management Note (Signed)
    Page 1 of 1   08/18/2013     1:08:55 PM CARE MANAGEMENT NOTE 08/18/2013  Patient:  Robert Bishop, Robert Bishop   Account Number:  [id number]  Date Initiated:  08/18/2013  Documentation initiated by:  Dessa Phi  Subjective/Objective Assessment:   68 Y/O M ADMITTED W/ERECTILE DYSFUNCTION.     Action/Plan:   FROM HOME.   Anticipated DC Date:  08/19/2013   Anticipated DC Plan:  [place]  CM consult      Choice offered to / List presented to:             Status of service:  In process, will continue to follow Medicare Important Message given?   (If response is "NO", the following Medicare IM given date fields will be blank) Date Medicare IM given:   Date Additional Medicare IM given:    Discharge Disposition:    Per UR Regulation:  Reviewed for med. necessity/level of care/duration of stay  If discussed at [place] of Stay Meetings, dates discussed:    Comments:  08/18/13 KATHY MAHABIR RN,BSN NCM [phone] S/P PENILE PROSTHETIC.NO ANTICIPATED D/C NEEDS.

## 2013-08-18 NOTE — Transfer of Care (Signed)
Immediate Anesthesia Transfer of Care Note  Patient: Robert Bishop  Procedure(s) Performed: Procedure(s): INSERTION OF COLOPLAST TITAN PENILE PROSTHESIS INFRAPUBIC APPROACH (N/A)  Patient Location: PACU  Anesthesia Type:General  Level of Consciousness: Patient easily awoken, sedated, comfortable, cooperative, following commands, responds to stimulation.   Airway & Oxygen Therapy: Patient spontaneously breathing, ventilating well, oxygen via simple oxygen mask.  Post-op Assessment: Report given to PACU RN, vital signs reviewed and stable, moving all extremities.   Post vital signs: Reviewed and stable.  Complications: No apparent anesthesia complications

## 2013-08-19 DIAGNOSIS — Z8546 Personal history of malignant neoplasm of prostate: Secondary | ICD-10-CM | POA: Diagnosis not present

## 2013-08-19 DIAGNOSIS — Z9079 Acquired absence of other genital organ(s): Secondary | ICD-10-CM | POA: Diagnosis not present

## 2013-08-19 DIAGNOSIS — E119 Type 2 diabetes mellitus without complications: Secondary | ICD-10-CM | POA: Diagnosis not present

## 2013-08-19 DIAGNOSIS — I10 Essential (primary) hypertension: Secondary | ICD-10-CM | POA: Diagnosis not present

## 2013-08-19 DIAGNOSIS — N529 Male erectile dysfunction, unspecified: Secondary | ICD-10-CM | POA: Diagnosis not present

## 2013-08-19 LAB — GLUCOSE, CAPILLARY
GLUCOSE-CAPILLARY: 102 mg/dL — AB (ref 70–99)
Glucose-Capillary: 103 mg/dL — ABNORMAL HIGH (ref 70–99)

## 2013-08-19 MED ORDER — SULFAMETHOXAZOLE-TMP DS 800-160 MG PO TABS: 1.0000 | ORAL_TABLET | Freq: Every day | ORAL | Status: DC

## 2013-08-19 MED ORDER — DSS 100 MG PO CAPS: 100.0000 mg | ORAL_CAPSULE | Freq: Two times a day (BID) | ORAL | Status: DC

## 2013-08-19 MED ORDER — OXYCODONE-ACETAMINOPHEN 5-325 MG PO TABS: 1.0000 | ORAL_TABLET | ORAL | Status: DC | PRN

## 2013-08-19 NOTE — Discharge Summary (Signed)
Physician Discharge Summary  Patient ID: Robert Bishop MRN: [id number] DOB/AGE: [birth year] 69 y.o.  Admit date: 08/18/2013 Discharge date: 08/19/2013  Admission Diagnoses:  Erectile dysfunction following radical prostatectomy  Discharge Diagnoses:  Principal Problem:   Erectile dysfunction following radical prostatectomy   Past Medical History  Diagnosis Date  . Lesion of bladder   . GERD (gastroesophageal reflux disease)   . H/O hiatal hernia   . Hypertension   . Diabetes mellitus without complication   . Hematuria   . History of blood transfusion yrs ago  . Anemia     Surgeries: Procedure(s): INSERTION OF COLOPLAST TITAN PENILE PROSTHESIS INFRAPUBIC APPROACH on 08/18/2013   Consultants (if any):    Discharged Condition: Improved  Hospital Course: Robert Bishop is an 68 y.o. male who was admitted 08/18/2013 with a diagnosis of Erectile dysfunction following radical prostatectomy and went to the operating room on 08/18/2013 and underwent the above named procedures.  He had some persistent corporal oozing at the end of the procedure and a 91fr JP was placed.   He had about 300cc of bloody drainage overnight but that has slowed significantly and the drain was removed at 1245pm.   His foley was removed this morning and he is voiding well.  He is ready for discharge.   He was given perioperative antibiotics:      Anti-infectives   Start     Dose/Rate Route Frequency Ordered Stop   08/19/13 0000  sulfamethoxazole-trimethoprim (BACTRIM DS) 800-160 MG per tablet     1 tablet Oral Daily 08/19/13 1247     08/18/13 2200  sulfamethoxazole-trimethoprim (BACTRIM DS) 800-160 MG per tablet 1 tablet     1 tablet Oral 2 times daily 08/18/13 1114     08/18/13 0828  polymyxin B 500,000 Units, bacitracin 50,000 Units in sodium chloride irrigation 0.9 % 500 mL irrigation  Status:  Discontinued       As needed 08/18/13 0828 08/18/13 0945   08/18/13 0827  polymyxin B 500,000 Units, bacitracin  50,000 Units in sodium chloride irrigation 0.9 % 500 mL irrigation  Status:  Discontinued       As needed 08/18/13 0827 08/18/13 0945   08/18/13 0600  vancomycin (VANCOCIN) 1,500 mg in sodium chloride 0.9 % 500 mL IVPB     1,500 mg 250 mL/hr over 120 Minutes Intravenous 120 min pre-op 08/17/13 1346 08/18/13 0918   08/18/13 0600  gentamicin (GARAMYCIN) 430 mg in dextrose 5 % 100 mL IVPB     5 mg/kg  86.4 kg (Order-Specific) 221.5 mL/hr over 30 Minutes Intravenous 60 min pre-op 08/17/13 1351 08/18/13 0753    .  He was given sequential compression devices and early ambulation for DVT prophylaxis.  He benefited maximally from the hospital stay and there were no complications.    Recent vital signs:  Filed Vitals:   08/19/13 0553  BP: 134/79  Pulse: 67  Temp: 98.6 F (37 C)  Resp: 16    Recent laboratory studies:  Lab Results  Component Value Date   HGB 14.8 08/11/2013   HGB 13.5 08/07/2012   HGB 14.6 08/06/2012   Lab Results  Component Value Date   WBC 4.8 08/11/2013   PLT 178 08/11/2013   No results found for this basename: INR   Lab Results  Component Value Date   NA 138 08/11/2013   K 4.4 08/11/2013   CL 100 08/11/2013   CO2 26 08/11/2013   BUN 18 08/11/2013   CREATININE 1.06 08/11/2013  GLUCOSE 104* 08/11/2013    Discharge Medications:     Medication List         aspirin EC 81 MG tablet  Take 81 mg by mouth daily.     DSS 100 MG Caps  Take 100 mg by mouth 2 (two) times daily.     hydrochlorothiazide 25 MG tablet  Commonly known as:  HYDRODIURIL  Take 25 mg by mouth at bedtime.     metFORMIN 500 MG (MOD) 24 hr tablet  Commonly known as:  GLUMETZA  Take 500 mg by mouth daily with breakfast.     multivitamin with minerals Tabs tablet  Take 1 tablet by mouth daily.     naproxen 500 MG 24 hr tablet  Commonly known as:  NAPRELAN  Take 500 mg by mouth daily with breakfast.     oxyCODONE-acetaminophen 5-325 MG per tablet  Commonly known as:  ROXICET  Take 1  tablet by mouth every 4 (four) hours as needed for severe pain.     simvastatin 10 MG tablet  Commonly known as:  ZOCOR  Take 10 mg by mouth at bedtime.     sulfamethoxazole-trimethoprim 800-160 MG per tablet  Commonly known as:  BACTRIM DS,SEPTRA DS  Take 1 tablet by mouth 2 (two) times daily.     sulfamethoxazole-trimethoprim 800-160 MG per tablet  Commonly known as:  BACTRIM DS  Take 1 tablet by mouth daily.        Diagnostic Studies: Dg Chest 2 View  08/11/2013   CLINICAL DATA:  Preop. History of hypertension. History of smoking.  EXAM: CHEST  2 VIEW  COMPARISON:  06/11/2012  FINDINGS: The heart size and mediastinal contours are within normal limits. Both lungs are clear. The visualized skeletal structures are unremarkable.  IMPRESSION: No active cardiopulmonary disease.   Electronically Signed   By: Lajean Manes M.D.   On: 08/11/2013 11:22    Disposition: 01-Home or Self Care  He will be kept on Bactrim post op initially to complete treatment and then for suppression of UTI.   Discharge Instructions   Discontinue IV    Complete by:  As directed            Follow-up Information   Follow up with Malka So, MD On 08/26/2013. (130)    Specialty:  Urology   Contact information:   [place] [id number] [phone]        Signed: JOANN, KULPA 08/19/2013, 12:49 PM

## 2013-08-19 NOTE — Progress Notes (Signed)
Patient ID: Robert Bishop, male   DOB: 03/28/1945, 68 y.o.   MRN: [id number] 1 Day Post-Op  Subjective: Robert Bishop is doing well post op without major complaints.   He has had 250cc of bloody drainage in his JP. ROS:  Review of Systems  Constitutional: Negative for fever.    Anti-infectives: Anti-infectives   Start     Dose/Rate Route Frequency Ordered Stop   08/18/13 2200  sulfamethoxazole-trimethoprim (BACTRIM DS) 800-160 MG per tablet 1 tablet     1 tablet Oral 2 times daily 08/18/13 1114     08/18/13 0828  polymyxin B 500,000 Units, bacitracin 50,000 Units in sodium chloride irrigation 0.9 % 500 mL irrigation  Status:  Discontinued       As needed 08/18/13 0828 08/18/13 0945   08/18/13 0827  polymyxin B 500,000 Units, bacitracin 50,000 Units in sodium chloride irrigation 0.9 % 500 mL irrigation  Status:  Discontinued       As needed 08/18/13 0827 08/18/13 0945   08/18/13 0600  vancomycin (VANCOCIN) 1,500 mg in sodium chloride 0.9 % 500 mL IVPB     1,500 mg 250 mL/hr over 120 Minutes Intravenous 120 min pre-op 08/17/13 1346 08/18/13 0918   08/18/13 0600  gentamicin (GARAMYCIN) 430 mg in dextrose 5 % 100 mL IVPB     5 mg/kg  86.4 kg (Order-Specific) 221.5 mL/hr over 30 Minutes Intravenous 60 min pre-op 08/17/13 1351 08/18/13 0753      Current Facility-Administered Medications  Medication Dose Route Frequency Provider Last Rate Last Dose  . 0.45 % NaCl with KCl 20 mEq / L infusion   Intravenous Continuous Malka So, MD 100 mL/hr at 08/18/13 2215    . acetaminophen (TYLENOL) tablet 650 mg  650 mg Oral Q4H PRN Malka So, MD      . bisacodyl (DULCOLAX) suppository 10 mg  10 mg Rectal Daily PRN Malka So, MD      . diphenhydrAMINE (BENADRYL) injection 12.5 mg  12.5 mg Intravenous Q6H PRN Malka So, MD       Or  . diphenhydrAMINE (BENADRYL) 12.5 MG/5ML elixir 12.5 mg  12.5 mg Oral Q6H PRN Malka So, MD      . docusate sodium (COLACE) capsule 100 mg  100 mg Oral BID Malka So, MD   100 mg at 08/18/13 2131  . hydrochlorothiazide (HYDRODIURIL) tablet 25 mg  25 mg Oral QHS Malka So, MD   25 mg at 08/18/13 2131  . HYDROmorphone (DILAUDID) injection 0.5-1 mg  0.5-1 mg Intravenous Q2H PRN Malka So, MD      . hyoscyamine (LEVSIN SL) SL tablet 0.125 mg  0.125 mg Oral Q4H PRN Malka So, MD      . insulin aspart (novoLOG) injection 0-15 Units  0-15 Units Subcutaneous TID WC Malka So, MD   3 Units at 08/18/13 1808  . metFORMIN (GLUCOPHAGE-XR) 24 hr tablet 500 mg  500 mg Oral Q breakfast Malka So, MD      . multivitamin with minerals tablet 1 tablet  1 tablet Oral Daily Malka So, MD   1 tablet at 08/18/13 1254  . ondansetron (ZOFRAN) injection 4 mg  4 mg Intravenous Q4H PRN Malka So, MD      . oxyCODONE (Oxy IR/ROXICODONE) immediate release tablet 5 mg  5 mg Oral Q4H PRN Malka So, MD   5 mg at 08/18/13 1937  . simvastatin (ZOCOR) tablet 10 mg  10  mg Oral QHS Malka So, MD   10 mg at 08/18/13 2131  . sulfamethoxazole-trimethoprim (BACTRIM DS) 800-160 MG per tablet 1 tablet  1 tablet Oral BID Malka So, MD   1 tablet at 08/18/13 2132     Objective: Vital signs in last 24 hours: Temp:  [97.8 F (36.6 C)-98.6 F (37 C)] 98.6 F (37 C) (06/24 0553) Pulse Rate:  [67-95] 67 (06/24 0553) Resp:  [12-18] 16 (06/24 0553) BP: (133-170)/(79-105) 134/79 mmHg (06/24 0553) SpO2:  [98 %-100 %] 98 % (06/24 0553) Weight:  [109.7 kg (241 lb 13.5 oz)] 109.7 kg (241 lb 13.5 oz) (06/23 1144)  Intake/Output from previous day: 06/23 0701 - 06/24 0700 In: 3508.3 [P.O.:480; I.V.:3028.3] Out: 4220 [Urine:3890; Drains:330] Intake/Output this shift: Total I/O In: 411.7 [I.V.:411.7] Out: 3455 [Urine:3300; Drains:155]   Physical Exam  Constitutional: He is well-developed, well-nourished, and in no distress.  Abdominal: Soft.  Genitourinary:  There is mild scrotal swelling with no ecchymosis.  The prosthesis was deflated and the foley was removed.    The incision is intact.  The JP is draining serosanguinous fluid that is more bloody than serous.     Lab Results:  No results found for this basename: WBC, HGB, HCT, PLT,  in the last 72 hours BMET No results found for this basename: NA, K, CL, CO2, GLUCOSE, BUN, CREATININE, CALCIUM,  in the last 72 hours PT/INR No results found for this basename: LABPROT, INR,  in the last 72 hours ABG No results found for this basename: PHART, PCO2, PO2, HCO3,  in the last 72 hours  Studies/Results: No results found.   Assessment: s/p Procedure(s): INSERTION OF COLOPLAST TITAN PENILE PROSTHESIS INFRAPUBIC APPROACH  He is doing well post op but had moderate JP drainage.   Plan: Foley removed. I had the JP bulb emptied and will recheck later today to see if it can be removed yet.      LOS: 1 day    WRENN,Rafael J 08/19/2013

## 2013-08-19 NOTE — Anesthesia Postprocedure Evaluation (Signed)
  Anesthesia Post-op Note  Patient: Robert Bishop  Procedure(s) Performed: Procedure(s) (LRB): INSERTION OF COLOPLAST TITAN PENILE PROSTHESIS INFRAPUBIC APPROACH (N/A)  Patient Location: PACU  Anesthesia Type: General  Level of Consciousness: awake and alert   Airway and Oxygen Therapy: Patient Spontanous Breathing  Post-op Pain: mild  Post-op Assessment: Post-op Vital signs reviewed, Patient's Cardiovascular Status Stable, Respiratory Function Stable, Patent Airway and No signs of Nausea or vomiting  Last Vitals:  Filed Vitals:   08/19/13 0553  BP: 134/79  Pulse: 67  Temp: 37 C  Resp: 16    Post-op Vital Signs: stable   Complications: No apparent anesthesia complications

## 2013-08-19 NOTE — Progress Notes (Signed)
Utilization review completed.  

## 2013-08-26 DIAGNOSIS — N529 Male erectile dysfunction, unspecified: Secondary | ICD-10-CM | POA: Diagnosis not present

## 2013-10-06 DIAGNOSIS — I10 Essential (primary) hypertension: Secondary | ICD-10-CM | POA: Diagnosis not present

## 2013-10-06 DIAGNOSIS — M1990 Unspecified osteoarthritis, unspecified site: Secondary | ICD-10-CM | POA: Diagnosis not present

## 2013-10-06 DIAGNOSIS — E119 Type 2 diabetes mellitus without complications: Secondary | ICD-10-CM | POA: Diagnosis not present

## 2013-10-06 DIAGNOSIS — E782 Mixed hyperlipidemia: Secondary | ICD-10-CM | POA: Diagnosis not present

## 2013-10-06 DIAGNOSIS — C61 Malignant neoplasm of prostate: Secondary | ICD-10-CM | POA: Diagnosis not present

## 2013-10-13 DIAGNOSIS — Z1331 Encounter for screening for depression: Secondary | ICD-10-CM | POA: Diagnosis not present

## 2013-10-13 DIAGNOSIS — E119 Type 2 diabetes mellitus without complications: Secondary | ICD-10-CM | POA: Diagnosis not present

## 2013-10-13 DIAGNOSIS — C61 Malignant neoplasm of prostate: Secondary | ICD-10-CM | POA: Diagnosis not present

## 2013-10-13 DIAGNOSIS — K5730 Diverticulosis of large intestine without perforation or abscess without bleeding: Secondary | ICD-10-CM | POA: Diagnosis not present

## 2013-10-13 DIAGNOSIS — Z23 Encounter for immunization: Secondary | ICD-10-CM | POA: Diagnosis not present

## 2013-10-13 DIAGNOSIS — I10 Essential (primary) hypertension: Secondary | ICD-10-CM | POA: Diagnosis not present

## 2013-10-13 DIAGNOSIS — Z0000 Encounter for general adult medical examination without abnormal findings: Secondary | ICD-10-CM | POA: Diagnosis not present

## 2013-10-13 DIAGNOSIS — E782 Mixed hyperlipidemia: Secondary | ICD-10-CM | POA: Diagnosis not present

## 2013-12-16 DIAGNOSIS — N5201 Erectile dysfunction due to arterial insufficiency: Secondary | ICD-10-CM | POA: Diagnosis not present

## 2013-12-16 DIAGNOSIS — Z8546 Personal history of malignant neoplasm of prostate: Secondary | ICD-10-CM | POA: Diagnosis not present

## 2014-04-06 DIAGNOSIS — E782 Mixed hyperlipidemia: Secondary | ICD-10-CM | POA: Diagnosis not present

## 2014-04-06 DIAGNOSIS — I10 Essential (primary) hypertension: Secondary | ICD-10-CM | POA: Diagnosis not present

## 2014-04-06 DIAGNOSIS — E119 Type 2 diabetes mellitus without complications: Secondary | ICD-10-CM | POA: Diagnosis not present

## 2014-04-13 DIAGNOSIS — F5221 Male erectile disorder: Secondary | ICD-10-CM | POA: Diagnosis not present

## 2014-04-13 DIAGNOSIS — C61 Malignant neoplasm of prostate: Secondary | ICD-10-CM | POA: Diagnosis not present

## 2014-04-13 DIAGNOSIS — E119 Type 2 diabetes mellitus without complications: Secondary | ICD-10-CM | POA: Diagnosis not present

## 2014-04-13 DIAGNOSIS — I10 Essential (primary) hypertension: Secondary | ICD-10-CM | POA: Diagnosis not present

## 2014-04-13 DIAGNOSIS — M1991 Primary osteoarthritis, unspecified site: Secondary | ICD-10-CM | POA: Diagnosis not present

## 2014-04-13 DIAGNOSIS — E782 Mixed hyperlipidemia: Secondary | ICD-10-CM | POA: Diagnosis not present

## 2014-05-06 DIAGNOSIS — E119 Type 2 diabetes mellitus without complications: Secondary | ICD-10-CM | POA: Diagnosis not present

## 2014-05-06 DIAGNOSIS — H538 Other visual disturbances: Secondary | ICD-10-CM | POA: Diagnosis not present

## 2014-06-10 DIAGNOSIS — Z8546 Personal history of malignant neoplasm of prostate: Secondary | ICD-10-CM | POA: Diagnosis not present

## 2014-06-17 DIAGNOSIS — Z8546 Personal history of malignant neoplasm of prostate: Secondary | ICD-10-CM | POA: Diagnosis not present

## 2014-06-17 DIAGNOSIS — N390 Urinary tract infection, site not specified: Secondary | ICD-10-CM | POA: Diagnosis not present

## 2014-06-17 DIAGNOSIS — Z87440 Personal history of urinary (tract) infections: Secondary | ICD-10-CM | POA: Diagnosis not present

## 2014-06-17 DIAGNOSIS — N5201 Erectile dysfunction due to arterial insufficiency: Secondary | ICD-10-CM | POA: Diagnosis not present

## 2014-07-27 DIAGNOSIS — N390 Urinary tract infection, site not specified: Secondary | ICD-10-CM | POA: Diagnosis not present

## 2014-08-05 DIAGNOSIS — N210 Calculus in bladder: Secondary | ICD-10-CM | POA: Diagnosis not present

## 2014-08-05 DIAGNOSIS — Z87440 Personal history of urinary (tract) infections: Secondary | ICD-10-CM | POA: Diagnosis not present

## 2014-08-06 ENCOUNTER — Other Ambulatory Visit: Payer: Self-pay | Admitting: Urology

## 2014-08-09 ENCOUNTER — Encounter (HOSPITAL_BASED_OUTPATIENT_CLINIC_OR_DEPARTMENT_OTHER): Payer: Self-pay | Admitting: *Deleted

## 2014-08-09 NOTE — Progress Notes (Signed)
   08/09/14 1108  OBSTRUCTIVE SLEEP APNEA  Have you ever been diagnosed with sleep apnea through a sleep study? No  Do you snore loudly (loud enough to be heard through closed doors)?  1  Do you often feel tired, fatigued, or sleepy during the daytime? 0  Has anyone observed you stop breathing during your sleep? 0  Do you have, or are you being treated for high blood pressure? 1  BMI more than 35 kg/m2? 1  Age over 70 years old? 1  Gender: 1  Obstructive Sleep Apnea Score 5

## 2014-08-09 NOTE — H&P (Signed)
Active Problems Problems  1. Bacteriuria with pyuria (N39.0) 2. Bladder calculus (N21.0) 3. Erectile dysfunction due to arterial insufficiency (N52.01) 4. History of malignant neoplasm of prostate (Z85.46) 5. History of prostate cancer (Z85.46) 6. History of urinary tract infection (Z87.440) 7. Microscopic hematuria (R31.2) 8. Muscle spasm (M62.838) 9. Muscle weakness (M62.81) 10. Other lack of coordination (R27.8)  History of Present Illness Robert Bishop returns today in f/u for cystoscopy for evaluation of his recurrent UTI's.  He is currently on keflex for a positive culture.   Past Medical History Problems  1. History of Arthritis 2. History of Elevated prostate specific antigen (PSA) (R97.2) 3. History of diabetes mellitus (Z86.39) 4. History of hypercholesterolemia (Z86.39) 5. History of hypertension (Z86.79) 6. History of malignant neoplasm of prostate (Z85.46) 7. History of nausea and vomiting (Z87.898) 8. History of urinary tract infection (Z87.440) 9. History of Peptic Ulcer 10. History of Urge and stress incontinence (N39.46) 11. History of Urinary urgency (R39.15)  Surgical History Problems  1. History of Cystoscopy With Biopsy 2. History of Cystoscopy With Fulguration Minor Lesion (Under 69mm) 3. History of Cystoscopy With Fulguration Small Lesion (5-25mm) 4. History of Laparoscopy With Bilateral Total Pelvic Lymphadenectomy 5. History of Prostatect Retropubic Radical W/ Nerve Sparing Laparoscopic 6. History of Reported Hx Of Surgery For An Ulcer 7. History of Surg Penis Insertion Of Penile Prosthesis  Current Meds 1. Aspirin 81 MG Oral Tablet Chewable;  Therapy: (Recorded:28Feb2014) to Recorded 2. Cephalexin 500 MG Oral Capsule; TAKE 1 CAPSULE 3 times daily;  Therapy: 25Apr2016 to (Evaluate:02Jul2016)  Requested for: 02Jun2016; Last  Rx:02Jun2016 Ordered 3. Hydrochlorothiazide 25 MG Oral Tablet;  Therapy: (Recorded:28Feb2014) to Recorded 4. MetFORMIN HCl ER 500  MG Oral Tablet Extended Release 24 Hour;  Therapy: (Recorded:28Feb2014) to Recorded 5. Multi-Vitamin TABS;  Therapy: (Recorded:27May2015) to Recorded 6. Simvastatin 10 MG Oral Tablet;  Therapy: (Recorded:28Feb2014) to Recorded  Allergies Medication  1. Oxycodone-Acetaminophen TABS  Family History Problems  1. Family history of Death In The Family Father   age 38 ? 2. Family history of Death In The Family Mother   age 44 3. Family history of Family Health Status Number Of Children   2 sons 2 daughters 64. No pertinent family history : Mother  Social History Problems  1. Caffeine Use   2 p/d 2. Exercise Habits   Patient currently does not exercise. 3. Former smoker [phone])   1 pk. p/d for 35 yrs. quit 25 yrs. 4. Living Independently With Spouse 5. Marital History - Currently Married 6. Never Drank Alcohol 7. Occupation: Retired 25. Retired From Work  Review of Systems  Genitourinary: no incontinence.  Cardiovascular: no chest pain.  Respiratory: no shortness of breath.    Vitals Vital Signs [Data Includes: Last 1 Day]  Recorded: [id number] 03:47PM  Blood Pressure: 116 / 77 Temperature: 98.3 F Heart Rate: 70  Physical Exam Constitutional: Well nourished and well developed . No acute distress.  Pulmonary: No respiratory distress and normal respiratory rhythm and effort.  Cardiovascular: Heart rate and rhythm are normal . No peripheral edema.    Results/Data Urine [Data Includes: Last 1 Day]   [id number]  COLOR YELLOW   APPEARANCE CLEAR   SPECIFIC GRAVITY 1.025   pH 5.5   GLUCOSE NEG mg/dL  BILIRUBIN NEG   KETONE NEG mg/dL  BLOOD TRACE   PROTEIN NEG mg/dL  UROBILINOGEN 0.2 mg/dL  NITRITE NEG   LEUKOCYTE ESTERASE NEG   SQUAMOUS EPITHELIAL/HPF RARE   WBC NONE SEEN WBC/hpf  RBC 0-2  RBC/hpf  BACTERIA NONE SEEN   CRYSTALS NONE SEEN   CASTS NONE SEEN    Procedure  Procedure: Cystoscopy   Informed Consent: Risks, benefits, and potential adverse  events were discussed and informed consent was obtained from the patient.  Prep: The patient was prepped with betadine.  Antibiotic prophylaxis:. He has been on keflex.  Procedure Note:  Urethral meatus:. No abnormalities.  Anterior urethra:. A stricture was present. (very mild panurethral disease with one area of possible distrophic calcification in the mid urethra posteriorly.Marland Kitchen ).  Prostatic urethra:. The prostate is absent but there is an eroded vascular clip anteriorly with calcification.  Bladder: Visulization was clear. The ureteral orifices were in the normal anatomic position bilaterally and had clear efflux of urine. A systematic survey of the bladder demonstrated no bladder tumors or stones. The mucosa was smooth without abnormalities. Examination of the bladder demonstrated mild trabeculation. The patient tolerated the procedure well.  Complications: None.    Assessment Assessed  1. Bladder calculus (N21.0) 2. History of urinary tract infection (Z87.440)  He has recurrent UTI's with a clip with calcification at the bladder neck.   Plan Bacteriuria with pyuria  1. Follow-up Schedule Surgery Office  Follow-up  Status: Hold For - Appointment   Requested for: [phone] Health Maintenance  2. UA With REFLEX; [Do Not Release]; Status:Resulted - Requires Verification;   Done:  [id number] 03:03PM  I will have him stay on keflex for suppression.  I am going to set him up for outpatient cystoscopy for removal of the clip.  Risks of bleeding infection, bladder neck injury, incontinence, thrombotic events and anesthetic complications reviewed.

## 2014-08-09 NOTE — Progress Notes (Signed)
Pt instructed npo pmn tonight.  To [facility] 6/14 @ 0600.  Needs istat

## 2014-08-10 ENCOUNTER — Ambulatory Visit (HOSPITAL_BASED_OUTPATIENT_CLINIC_OR_DEPARTMENT_OTHER): Payer: Medicare Other | Admitting: Anesthesiology

## 2014-08-10 ENCOUNTER — Encounter (HOSPITAL_BASED_OUTPATIENT_CLINIC_OR_DEPARTMENT_OTHER)
Admission: RE | Disposition: A | Discharge status: Home / Self Care | Payer: Self-pay | Source: Ambulatory Visit | Attending: Urology

## 2014-08-10 ENCOUNTER — Ambulatory Visit (HOSPITAL_BASED_OUTPATIENT_CLINIC_OR_DEPARTMENT_OTHER)
Admission: RE | Admit: 2014-08-10 | Discharge: 2014-08-10 | Disposition: A | Discharge status: Home / Self Care | Payer: Medicare Other | Source: Ambulatory Visit | Attending: Urology | Admitting: Urology

## 2014-08-10 ENCOUNTER — Encounter (HOSPITAL_BASED_OUTPATIENT_CLINIC_OR_DEPARTMENT_OTHER): Payer: Self-pay | Admitting: *Deleted

## 2014-08-10 DIAGNOSIS — T190XXA Foreign body in urethra, initial encounter: Secondary | ICD-10-CM | POA: Diagnosis not present

## 2014-08-10 DIAGNOSIS — I10 Essential (primary) hypertension: Secondary | ICD-10-CM | POA: Insufficient documentation

## 2014-08-10 DIAGNOSIS — R312 Other microscopic hematuria: Secondary | ICD-10-CM | POA: Insufficient documentation

## 2014-08-10 DIAGNOSIS — E780 Pure hypercholesterolemia: Secondary | ICD-10-CM | POA: Insufficient documentation

## 2014-08-10 DIAGNOSIS — Z87891 Personal history of nicotine dependence: Secondary | ICD-10-CM | POA: Diagnosis not present

## 2014-08-10 DIAGNOSIS — T83718A Erosion of other implanted mesh and other prosthetic materials to surrounding organ or tissue, initial encounter: Secondary | ICD-10-CM | POA: Diagnosis not present

## 2014-08-10 DIAGNOSIS — M62838 Other muscle spasm: Secondary | ICD-10-CM | POA: Diagnosis not present

## 2014-08-10 DIAGNOSIS — Z6837 Body mass index (BMI) 37.0-37.9, adult: Secondary | ICD-10-CM | POA: Diagnosis not present

## 2014-08-10 DIAGNOSIS — Z885 Allergy status to narcotic agent status: Secondary | ICD-10-CM | POA: Insufficient documentation

## 2014-08-10 DIAGNOSIS — Z8546 Personal history of malignant neoplasm of prostate: Secondary | ICD-10-CM | POA: Insufficient documentation

## 2014-08-10 DIAGNOSIS — Z7982 Long term (current) use of aspirin: Secondary | ICD-10-CM | POA: Insufficient documentation

## 2014-08-10 DIAGNOSIS — N390 Urinary tract infection, site not specified: Secondary | ICD-10-CM | POA: Insufficient documentation

## 2014-08-10 DIAGNOSIS — M1990 Unspecified osteoarthritis, unspecified site: Secondary | ICD-10-CM | POA: Diagnosis not present

## 2014-08-10 DIAGNOSIS — N210 Calculus in bladder: Secondary | ICD-10-CM | POA: Insufficient documentation

## 2014-08-10 DIAGNOSIS — N358 Other urethral stricture: Secondary | ICD-10-CM | POA: Insufficient documentation

## 2014-08-10 DIAGNOSIS — K219 Gastro-esophageal reflux disease without esophagitis: Secondary | ICD-10-CM | POA: Insufficient documentation

## 2014-08-10 DIAGNOSIS — E119 Type 2 diabetes mellitus without complications: Secondary | ICD-10-CM | POA: Diagnosis not present

## 2014-08-10 DIAGNOSIS — I771 Stricture of artery: Secondary | ICD-10-CM | POA: Diagnosis not present

## 2014-08-10 DIAGNOSIS — Y813 Surgical instruments, materials and general- and plastic-surgery devices (including sutures) associated with adverse incidents: Secondary | ICD-10-CM | POA: Diagnosis not present

## 2014-08-10 DIAGNOSIS — N521 Erectile dysfunction due to diseases classified elsewhere: Secondary | ICD-10-CM | POA: Insufficient documentation

## 2014-08-10 DIAGNOSIS — N3946 Mixed incontinence: Secondary | ICD-10-CM | POA: Insufficient documentation

## 2014-08-10 DIAGNOSIS — Z87440 Personal history of urinary (tract) infections: Secondary | ICD-10-CM | POA: Diagnosis not present

## 2014-08-10 DIAGNOSIS — T8389XA Other specified complication of genitourinary prosthetic devices, implants and grafts, initial encounter: Secondary | ICD-10-CM | POA: Diagnosis not present

## 2014-08-10 HISTORY — DX: Presence of spectacles and contact lenses: Z97.3

## 2014-08-10 HISTORY — PX: CYSTOSCOPY: SHX5120

## 2014-08-10 LAB — POCT I-STAT 4, (NA,K, GLUC, HGB,HCT)
GLUCOSE: 120 mg/dL — AB (ref 65–99)
HCT: 48 % (ref 39.0–52.0)
Hemoglobin: 16.3 g/dL (ref 13.0–17.0)
Potassium: 4.1 mmol/L (ref 3.5–5.1)
SODIUM: 140 mmol/L (ref 135–145)

## 2014-08-10 LAB — GLUCOSE, CAPILLARY: GLUCOSE-CAPILLARY: 112 mg/dL — AB (ref 65–99)

## 2014-08-10 SURGERY — CYSTOSCOPY, FLEXIBLE
Anesthesia: General | Site: Bladder

## 2014-08-10 MED ORDER — LACTATED RINGERS IV SOLN
INTRAVENOUS | Status: DC
  Administered 2014-08-10: 07:00:00 via INTRAVENOUS
  Filled 2014-08-10: qty 1000

## 2014-08-10 MED ORDER — HYDROMORPHONE HCL 1 MG/ML IJ SOLN
0.2500 mg | INTRAMUSCULAR | Status: DC | PRN
  Filled 2014-08-10: qty 1

## 2014-08-10 MED ORDER — ACETAMINOPHEN 650 MG RE SUPP
650.0000 mg | RECTAL | Status: DC | PRN
  Filled 2014-08-10: qty 1

## 2014-08-10 MED ORDER — SODIUM CHLORIDE 0.9 % IJ SOLN
3.0000 mL | Freq: Two times a day (BID) | INTRAMUSCULAR | Status: DC
  Filled 2014-08-10: qty 3

## 2014-08-10 MED ORDER — EPHEDRINE SULFATE 50 MG/ML IJ SOLN
INTRAMUSCULAR | Status: DC | PRN
  Administered 2014-08-10: 10 mg via INTRAVENOUS

## 2014-08-10 MED ORDER — OXYCODONE HCL 5 MG PO TABS
5.0000 mg | ORAL_TABLET | ORAL | Status: DC | PRN
  Filled 2014-08-10: qty 2

## 2014-08-10 MED ORDER — FENTANYL CITRATE (PF) 100 MCG/2ML IJ SOLN
INTRAMUSCULAR | Status: DC | PRN
  Administered 2014-08-10: 50 ug via INTRAVENOUS

## 2014-08-10 MED ORDER — ONDANSETRON HCL 4 MG/2ML IJ SOLN
INTRAMUSCULAR | Status: DC | PRN
  Administered 2014-08-10: 4 mg via INTRAVENOUS

## 2014-08-10 MED ORDER — SODIUM CHLORIDE 0.9 % IV SOLN
250.0000 mL | INTRAVENOUS | Status: DC | PRN
  Filled 2014-08-10: qty 250

## 2014-08-10 MED ORDER — MIDAZOLAM HCL 2 MG/2ML IJ SOLN
INTRAMUSCULAR | Status: AC
  Filled 2014-08-10: qty 2

## 2014-08-10 MED ORDER — FENTANYL CITRATE (PF) 100 MCG/2ML IJ SOLN
25.0000 ug | INTRAMUSCULAR | Status: DC | PRN
  Filled 2014-08-10: qty 1

## 2014-08-10 MED ORDER — ACETAMINOPHEN 325 MG PO TABS
650.0000 mg | ORAL_TABLET | ORAL | Status: DC | PRN
  Filled 2014-08-10: qty 2

## 2014-08-10 MED ORDER — PROPOFOL 10 MG/ML IV BOLUS
INTRAVENOUS | Status: DC | PRN
  Administered 2014-08-10: 200 mg via INTRAVENOUS

## 2014-08-10 MED ORDER — LACTATED RINGERS IV SOLN
INTRAVENOUS | Status: DC
  Filled 2014-08-10: qty 1000

## 2014-08-10 MED ORDER — DEXAMETHASONE SODIUM PHOSPHATE 4 MG/ML IJ SOLN
INTRAMUSCULAR | Status: DC | PRN
  Administered 2014-08-10: 5 mg via INTRAVENOUS

## 2014-08-10 MED ORDER — LIDOCAINE HCL (CARDIAC) 20 MG/ML IV SOLN
INTRAVENOUS | Status: DC | PRN
  Administered 2014-08-10: 60 mg via INTRAVENOUS

## 2014-08-10 MED ORDER — SODIUM CHLORIDE 0.9 % IJ SOLN
3.0000 mL | INTRAMUSCULAR | Status: DC | PRN
  Filled 2014-08-10: qty 3

## 2014-08-10 MED ORDER — LIDOCAINE HCL 2 % EX GEL
CUTANEOUS | Status: DC | PRN
  Administered 2014-08-10: 1 via URETHRAL

## 2014-08-10 MED ORDER — MIDAZOLAM HCL 5 MG/5ML IJ SOLN
INTRAMUSCULAR | Status: DC | PRN
  Administered 2014-08-10: 2 mg via INTRAVENOUS

## 2014-08-10 MED ORDER — CEFAZOLIN SODIUM-DEXTROSE 2-3 GM-% IV SOLR
2.0000 g | INTRAVENOUS | Status: AC
  Administered 2014-08-10: 2 g via INTRAVENOUS
  Filled 2014-08-10: qty 50

## 2014-08-10 MED ORDER — FENTANYL CITRATE (PF) 100 MCG/2ML IJ SOLN
INTRAMUSCULAR | Status: AC
  Filled 2014-08-10: qty 4

## 2014-08-10 MED ORDER — STERILE WATER FOR IRRIGATION IR SOLN
Status: DC | PRN
Start: 2014-08-10 — End: 2014-08-10
  Administered 2014-08-10: 3000 mL

## 2014-08-10 SURGICAL SUPPLY — 35 items
BAG DRAIN URO-CYSTO SKYTR STRL (DRAIN) ×4 IMPLANT
BASKET LASER NITINOL 1.9FR (BASKET) IMPLANT
BASKET STNLS GEMINI 4WIRE 3FR (BASKET) IMPLANT
BASKET ZERO TIP NITINOL 2.4FR (BASKET) IMPLANT
CANISTER SUCT LVC 12 LTR MEDI- (MISCELLANEOUS) IMPLANT
CATH URET 5FR 28IN CONE TIP (BALLOONS)
CATH URET 5FR 28IN OPEN ENDED (CATHETERS) IMPLANT
CATH URET 5FR 70CM CONE TIP (BALLOONS) IMPLANT
CLOTH BEACON ORANGE TIMEOUT ST (SAFETY) ×4 IMPLANT
ELECT REM PT RETURN 9FT ADLT (ELECTROSURGICAL)
ELECTRODE REM PT RTRN 9FT ADLT (ELECTROSURGICAL) IMPLANT
FIBER LASER FLEXIVA 1000 (UROLOGICAL SUPPLIES) IMPLANT
FIBER LASER FLEXIVA 365 (UROLOGICAL SUPPLIES) IMPLANT
FIBER LASER FLEXIVA 550 (UROLOGICAL SUPPLIES) IMPLANT
FIBER LASER TRAC TIP (UROLOGICAL SUPPLIES) IMPLANT
GLOVE BIO SURGEON STRL SZ 6.5 (GLOVE) ×3 IMPLANT
GLOVE BIO SURGEON STRL SZ7 (GLOVE) ×4 IMPLANT
GLOVE BIO SURGEONS STRL SZ 6.5 (GLOVE) ×1
GLOVE INDICATOR 6.5 STRL GRN (GLOVE) ×4 IMPLANT
GLOVE INDICATOR 7.0 STRL GRN (GLOVE) ×4 IMPLANT
GLOVE SURG SS PI 8.0 STRL IVOR (GLOVE) ×4 IMPLANT
GOWN STRL REUS W/ TWL LRG LVL3 (GOWN DISPOSABLE) ×4 IMPLANT
GOWN STRL REUS W/ TWL XL LVL3 (GOWN DISPOSABLE) ×2 IMPLANT
GOWN STRL REUS W/TWL LRG LVL3 (GOWN DISPOSABLE) ×4
GOWN STRL REUS W/TWL XL LVL3 (GOWN DISPOSABLE) ×2
GUIDEWIRE 0.038 PTFE COATED (WIRE) IMPLANT
GUIDEWIRE ANG ZIPWIRE 038X150 (WIRE) IMPLANT
GUIDEWIRE STR DUAL SENSOR (WIRE) IMPLANT
KIT BALLIN UROMAX 15FX10 (LABEL) IMPLANT
KIT BALLN UROMAX 15FX4 (MISCELLANEOUS) IMPLANT
KIT BALLN UROMAX 26 75X4 (MISCELLANEOUS)
PACK CYSTO (CUSTOM PROCEDURE TRAY) IMPLANT
SET HIGH PRES BAL DIL (LABEL)
TUBE CONNECTING 12'X1/4 (SUCTIONS) ×1
TUBE CONNECTING 12X1/4 (SUCTIONS) ×3 IMPLANT

## 2014-08-10 NOTE — OR Nursing (Signed)
Dr. Gerrit Friends the vascular clip removal and gave it to the patient's daughter.

## 2014-08-10 NOTE — Anesthesia Postprocedure Evaluation (Signed)
  Anesthesia Post-op Note  Patient: Robert Bishop  Procedure(s) Performed: Procedure(s): CYSTOSCOPY FLEXIBLE AND REMOVAL OF FOREIGN BODY ( Vascular Clip) (N/A)  Patient Location: PACU  Anesthesia Type:General  Level of Consciousness: awake and alert   Airway and Oxygen Therapy: Patient Spontanous Breathing  Post-op Pain: none  Post-op Assessment: Post-op Vital signs reviewed, Patient's Cardiovascular Status Stable and Respiratory Function Stable  Post-op Vital Signs: Reviewed  Filed Vitals:   08/10/14 0915  BP:   Pulse: 53  Temp:   Resp: 22    Complications: No apparent anesthesia complications

## 2014-08-10 NOTE — Transfer of Care (Signed)
Last Vitals:  Filed Vitals:   08/10/14 0636  BP: 134/79  Pulse: 52  Temp: 36.5 C  Resp: 18    Immediate Anesthesia Transfer of Care Note  Patient: Robert Bishop  Procedure(s) Performed: Procedure(s) (LRB): CYSTOSCOPY FLEXIBLE AND REMOVAL OF FOREIGN BODY ( Vascular Clip) (N/A)  Patient Location: PACU  Anesthesia Type: General  Level of Consciousness: awake, alert  and oriented  Airway & Oxygen Therapy: Patient Spontanous Breathing and Patient connected to face mask oxygen  Post-op Assessment: Report given to PACU RN and Post -op Vital signs reviewed and stable  Post vital signs: Reviewed and stable  Complications: No apparent anesthesia complications

## 2014-08-10 NOTE — Anesthesia Preprocedure Evaluation (Addendum)
Anesthesia Evaluation  Patient identified by MRN, date of birth, ID band Patient awake    Reviewed: Allergy & Precautions, H&P , NPO status , Patient's Chart, lab work & pertinent test results  Airway Mallampati: II  TM Distance: >3 FB Neck ROM: Full    Dental no notable dental hx. (+) Teeth Intact, Dental Advisory Given   Pulmonary neg pulmonary ROS, former smoker,  breath sounds clear to auscultation  Pulmonary exam normal       Cardiovascular hypertension, Pt. on medications Rhythm:Regular Rate:Normal     Neuro/Psych negative neurological ROS  negative psych ROS   GI/Hepatic Neg liver ROS, GERD-  Controlled,  Endo/Other  diabetes, Type 2, Oral Hypoglycemic AgentsMorbid obesity  Renal/GU negative Renal ROS  negative genitourinary   Musculoskeletal   Abdominal   Peds  Hematology negative hematology ROS (+)   Anesthesia Other Findings   Reproductive/Obstetrics negative OB ROS                            Anesthesia Physical Anesthesia Plan  ASA: III  Anesthesia Plan: General   Post-op Pain Management:    Induction: Intravenous  Airway Management Planned: LMA  Additional Equipment:   Intra-op Plan:   Post-operative Plan: Extubation in OR  Informed Consent: I have reviewed the patients History and Physical, chart, labs and discussed the procedure including the risks, benefits and alternatives for the proposed anesthesia with the patient or authorized representative who has indicated his/her understanding and acceptance.   Dental advisory given  Plan Discussed with: CRNA  Anesthesia Plan Comments:         Anesthesia Quick Evaluation

## 2014-08-10 NOTE — Discharge Instructions (Addendum)

## 2014-08-10 NOTE — Interval H&P Note (Signed)
History and Physical Interval Note:  08/10/2014 7:15 AM  Robert Bishop  has presented today for surgery, with the diagnosis of retained clip with stone  The various methods of treatment have been discussed with the patient and family. After consideration of risks, benefits and other options for treatment, the patient has consented to  Procedure(s): CYSTOSCOPY WITH foreign body removal (N/A) as a surgical intervention .  The patient's history has been reviewed, patient examined, no change in status, stable for surgery.  I have reviewed the patient's chart and labs.  Questions were answered to the patient's satisfaction.     [name],Beaux J

## 2014-08-10 NOTE — Op Note (Signed)
Procedure: Cystoscopy with removal of foreign body.  Preop Dx: Eroded staple at bladder neck  Postop Dx: Same.  Surgeon: Irine Seal  MD.  Anesth: General:  Drains: None  EBL: None.  Complications: None.  Specimen: Staple.  Indications:  Robert Bishop has had recurrent UTI's with a prior history of RALP and IPP placement.   He had cystoscopy last week that showed an eroded staple from the DV staple line with calcifications that was felt to possibly be the source of the UTI recurrence.  Procedure:   He was taken to the OR where he was given ancef.   A general anesthetic was induced.   He was placed in the lithotomy position and fitted with PAS hose.   He was prepped with betadine and draped in the usual sterile fashion.  Cysto was performed with the [facility] flexible cystoscope.   He had mild panurethral stricture disease.  The external sphincter was intact.  The prostate was absent and there was no BNC but there was a calcified staple at 12 o'clock.  The bladder was smooth without lesions and the UO's were normal.  The staple was identified and was grasped with a grasping forceps and removed with minimal difficulty.   Repeat inspection reveal no residual staples and no bleeding.  The scope was removed and the urethra was instilled with 17ml of 2% lidocaine jelly.   The anesthetic was reversed and he was moved to the PACU in stable condition.  There were no complications.

## 2014-08-10 NOTE — Anesthesia Procedure Notes (Signed)
Procedure Name: LMA Insertion Date/Time: 08/10/2014 7:40 AM Performed by: Mechele Claude Pre-anesthesia Checklist: Patient identified, Emergency Drugs available, Suction available and Patient being monitored Patient Re-evaluated:Patient Re-evaluated prior to inductionOxygen Delivery Method: Circle System Utilized Preoxygenation: Pre-oxygenation with 100% oxygen Intubation Type: IV induction Ventilation: Mask ventilation without difficulty LMA: LMA inserted LMA Size: 5.0 Number of attempts: 1 Airway Equipment and Method: bite block Placement Confirmation: positive ETCO2 Tube secured with: Tape Dental Injury: Teeth and Oropharynx as per pre-operative assessment

## 2014-08-10 NOTE — OR Nursing (Signed)
Dr. Jeffie Pollock  Did a straight cath with red rubber 16Fr at 0741 am.

## 2014-08-12 ENCOUNTER — Encounter (HOSPITAL_BASED_OUTPATIENT_CLINIC_OR_DEPARTMENT_OTHER): Payer: Self-pay | Admitting: Urology

## 2014-08-18 DIAGNOSIS — Z87440 Personal history of urinary (tract) infections: Secondary | ICD-10-CM | POA: Diagnosis not present

## 2014-11-03 DIAGNOSIS — K644 Residual hemorrhoidal skin tags: Secondary | ICD-10-CM | POA: Diagnosis not present

## 2014-11-03 DIAGNOSIS — K5730 Diverticulosis of large intestine without perforation or abscess without bleeding: Secondary | ICD-10-CM | POA: Diagnosis not present

## 2014-11-03 DIAGNOSIS — D122 Benign neoplasm of ascending colon: Secondary | ICD-10-CM | POA: Diagnosis not present

## 2014-11-03 DIAGNOSIS — Z86010 Personal history of colonic polyps: Secondary | ICD-10-CM | POA: Diagnosis not present

## 2014-11-03 DIAGNOSIS — Z1211 Encounter for screening for malignant neoplasm of colon: Secondary | ICD-10-CM | POA: Diagnosis not present

## 2014-12-23 DIAGNOSIS — Z8546 Personal history of malignant neoplasm of prostate: Secondary | ICD-10-CM | POA: Diagnosis not present

## 2014-12-27 IMAGING — CR DG CHEST 2V
2 series · 2 of 2 positions shown · non-contrast
Comparison: 06/11/2012

CLINICAL DATA: Preop. History of hypertension. History of smoking.

EXAM:
CHEST  2 VIEW

[w chest pa]
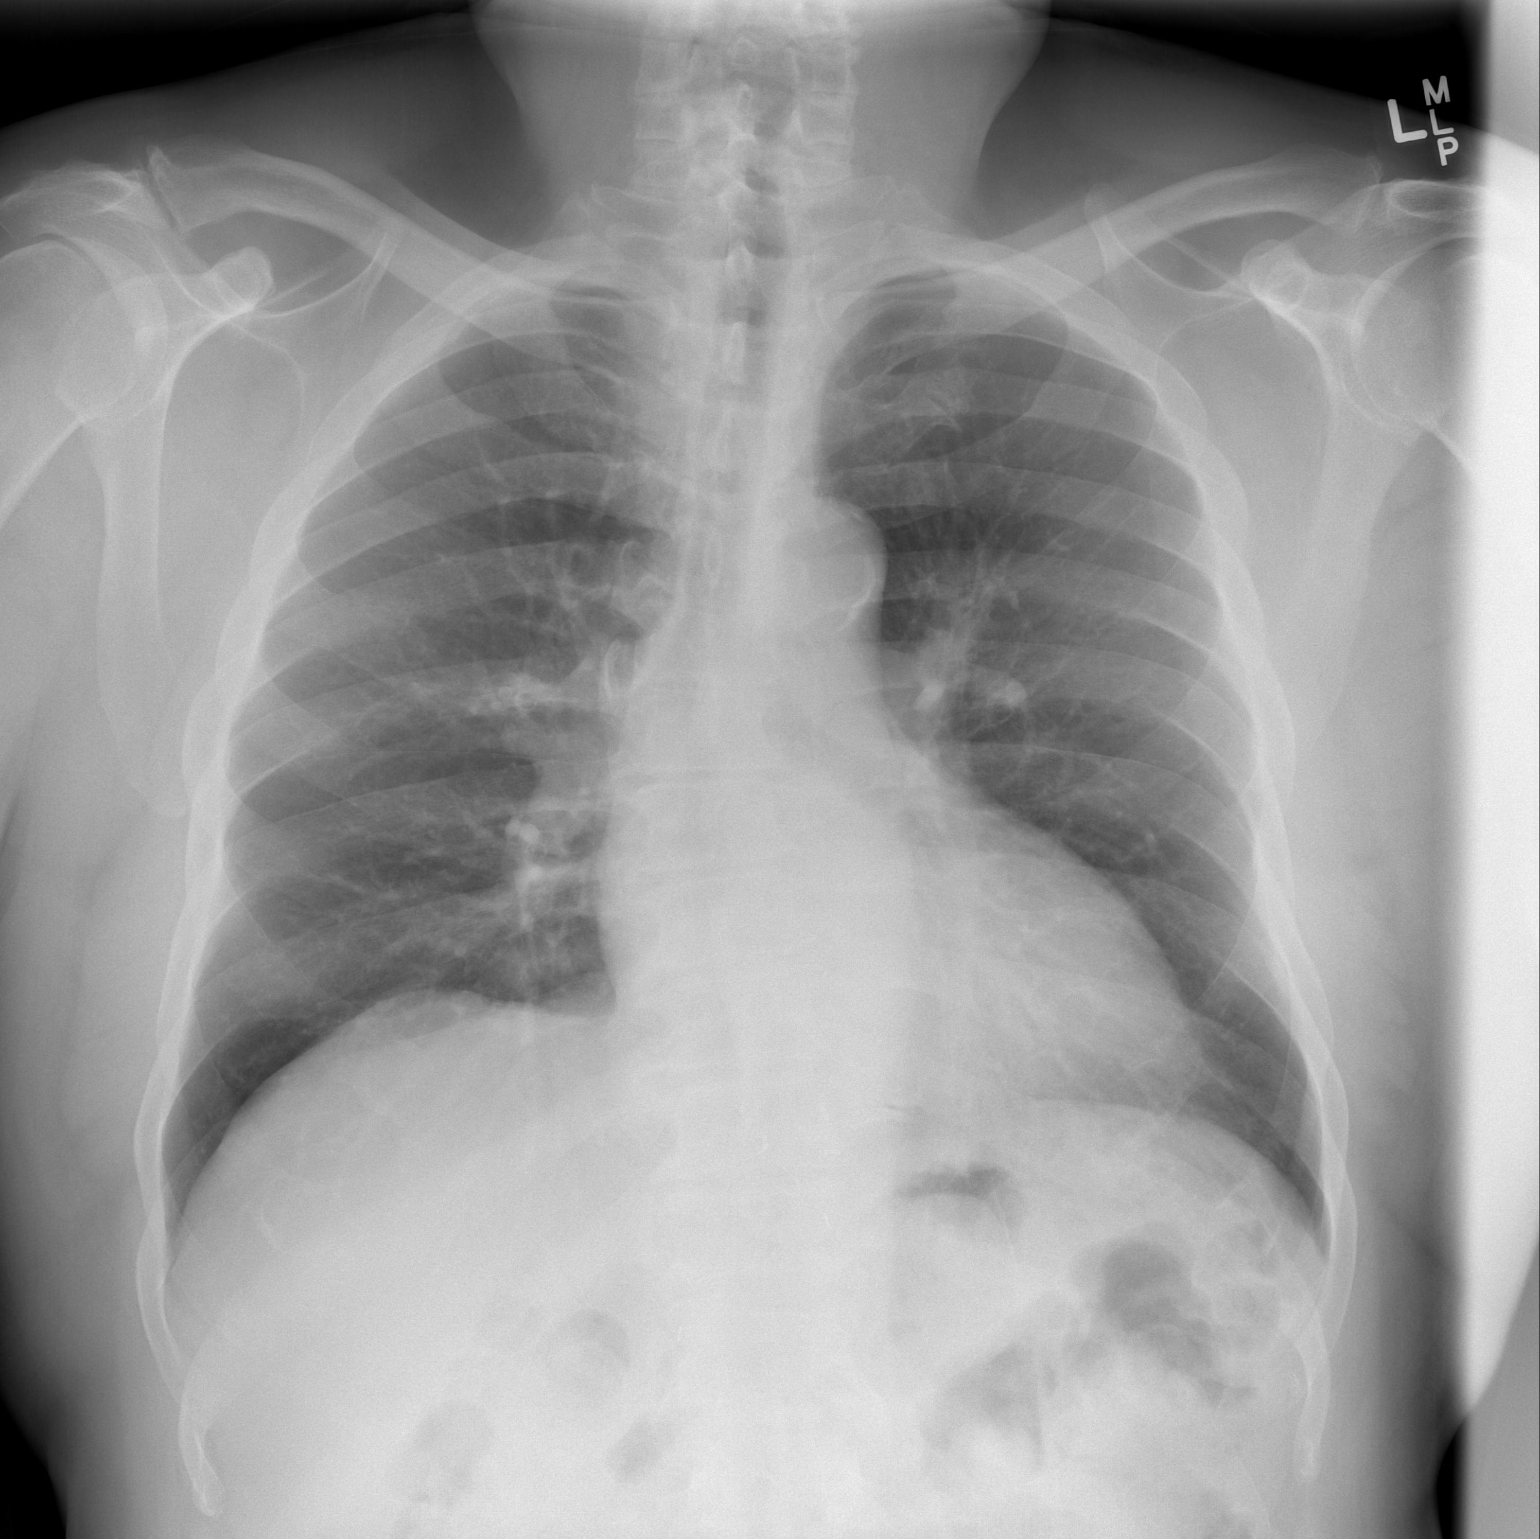

[w chest lat]
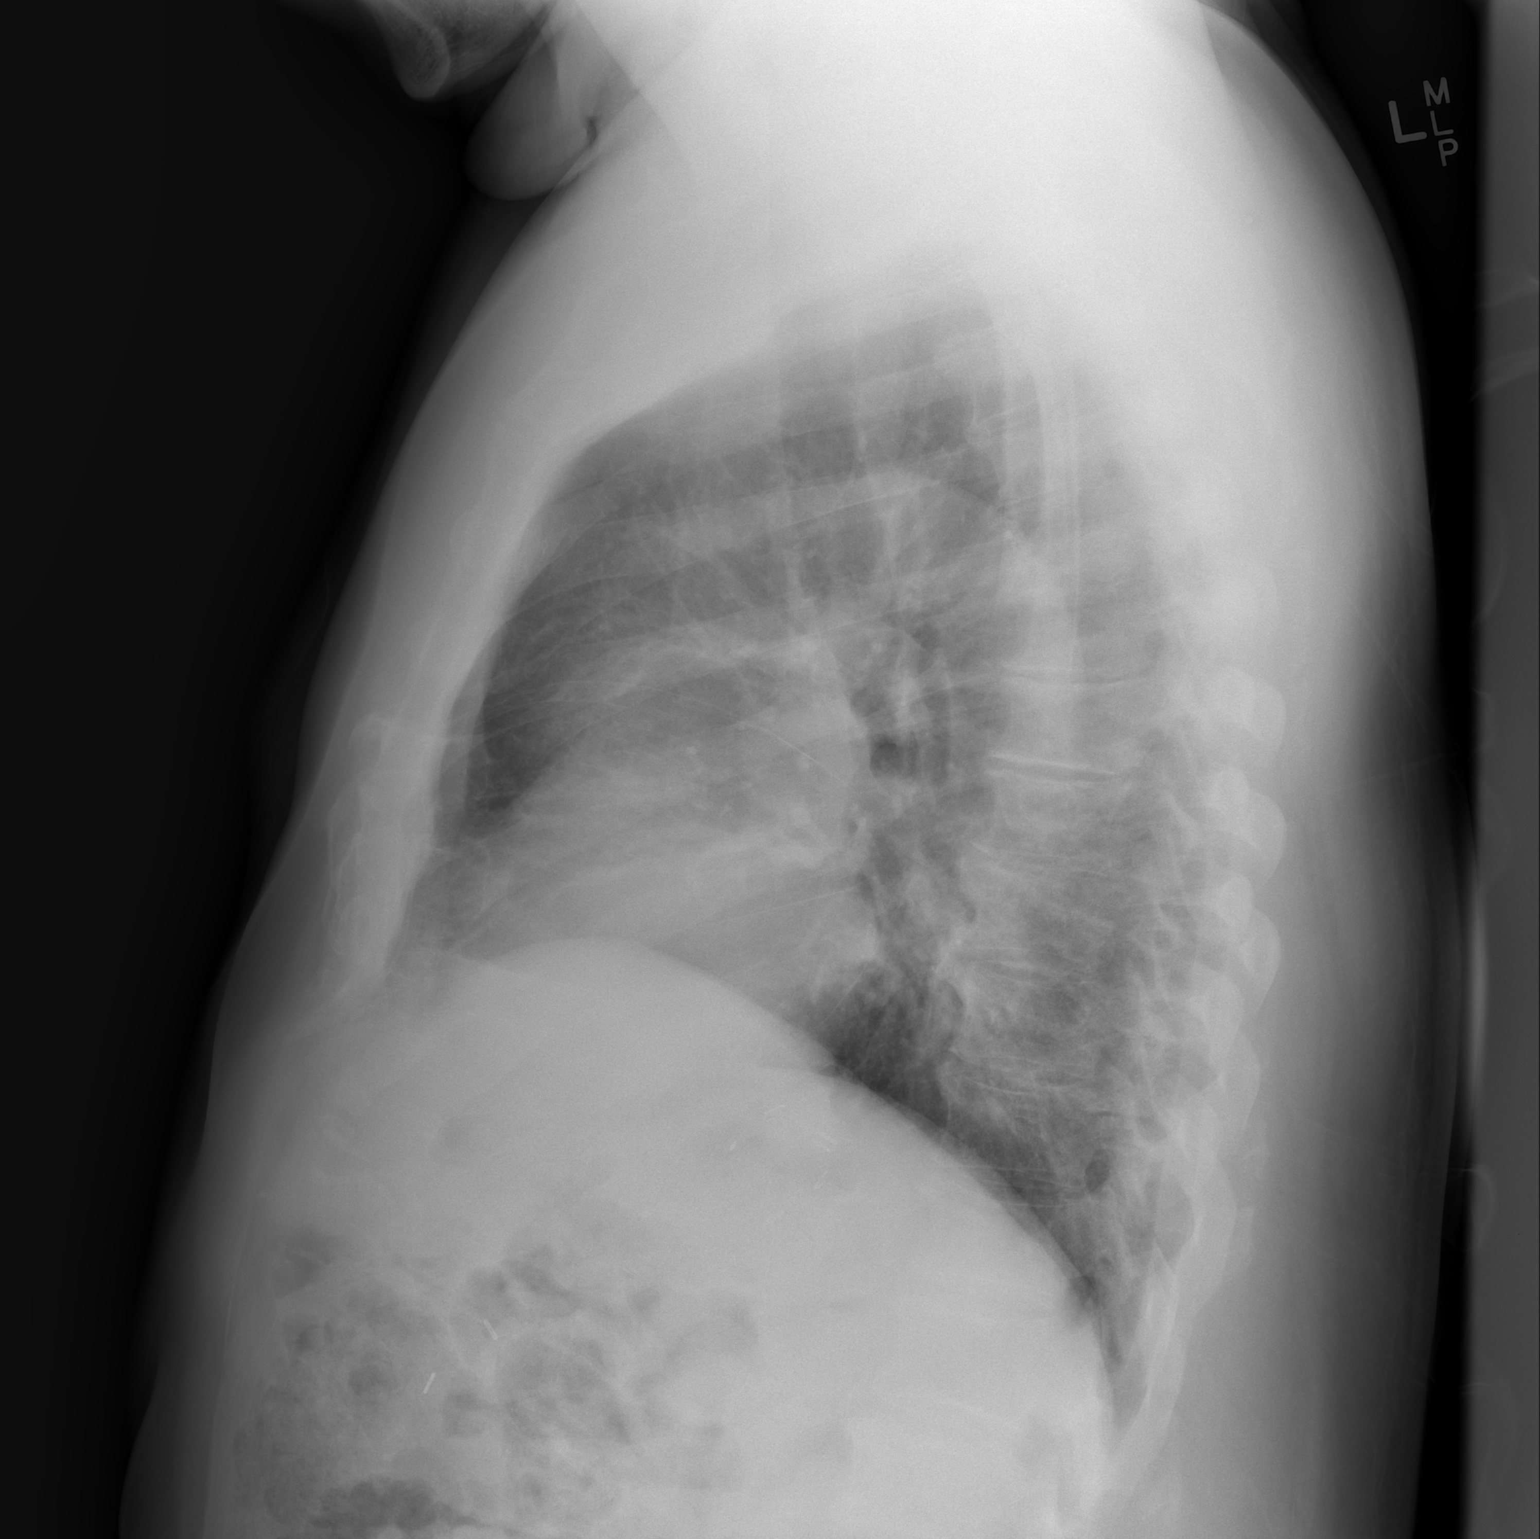

[2 of 2 positions shown; findings below may reference images not displayed]

FINDINGS: The heart size and mediastinal contours are within normal limits.
Both lungs are clear. The visualized skeletal structures are
unremarkable.
IMPRESSION: No active cardiopulmonary disease.

## 2014-12-30 DIAGNOSIS — N5201 Erectile dysfunction due to arterial insufficiency: Secondary | ICD-10-CM | POA: Diagnosis not present

## 2014-12-30 DIAGNOSIS — Z8546 Personal history of malignant neoplasm of prostate: Secondary | ICD-10-CM | POA: Diagnosis not present

## 2015-01-31 DIAGNOSIS — L209 Atopic dermatitis, unspecified: Secondary | ICD-10-CM | POA: Diagnosis not present

## 2015-01-31 DIAGNOSIS — Z1389 Encounter for screening for other disorder: Secondary | ICD-10-CM | POA: Diagnosis not present

## 2015-01-31 DIAGNOSIS — L919 Hypertrophic disorder of the skin, unspecified: Secondary | ICD-10-CM | POA: Diagnosis not present

## 2015-02-10 DIAGNOSIS — R972 Elevated prostate specific antigen [PSA]: Secondary | ICD-10-CM | POA: Diagnosis not present

## 2015-02-10 DIAGNOSIS — I10 Essential (primary) hypertension: Secondary | ICD-10-CM | POA: Diagnosis not present

## 2015-02-10 DIAGNOSIS — E782 Mixed hyperlipidemia: Secondary | ICD-10-CM | POA: Diagnosis not present

## 2015-02-10 DIAGNOSIS — E119 Type 2 diabetes mellitus without complications: Secondary | ICD-10-CM | POA: Diagnosis not present

## 2015-02-10 DIAGNOSIS — E785 Hyperlipidemia, unspecified: Secondary | ICD-10-CM | POA: Diagnosis not present

## 2015-02-16 DIAGNOSIS — R972 Elevated prostate specific antigen [PSA]: Secondary | ICD-10-CM | POA: Diagnosis not present

## 2015-02-16 DIAGNOSIS — Z13220 Encounter for screening for lipoid disorders: Secondary | ICD-10-CM | POA: Diagnosis not present

## 2015-02-16 DIAGNOSIS — Z0001 Encounter for general adult medical examination with abnormal findings: Secondary | ICD-10-CM | POA: Diagnosis not present

## 2015-02-16 DIAGNOSIS — L209 Atopic dermatitis, unspecified: Secondary | ICD-10-CM | POA: Diagnosis not present

## 2015-02-16 DIAGNOSIS — F5221 Male erectile disorder: Secondary | ICD-10-CM | POA: Diagnosis not present

## 2015-02-16 DIAGNOSIS — E119 Type 2 diabetes mellitus without complications: Secondary | ICD-10-CM | POA: Diagnosis not present

## 2015-02-16 DIAGNOSIS — C61 Malignant neoplasm of prostate: Secondary | ICD-10-CM | POA: Diagnosis not present

## 2015-02-16 DIAGNOSIS — I10 Essential (primary) hypertension: Secondary | ICD-10-CM | POA: Diagnosis not present

## 2015-02-16 DIAGNOSIS — E559 Vitamin D deficiency, unspecified: Secondary | ICD-10-CM | POA: Diagnosis not present

## 2015-03-03 DIAGNOSIS — L280 Lichen simplex chronicus: Secondary | ICD-10-CM | POA: Diagnosis not present

## 2015-03-03 DIAGNOSIS — L439 Lichen planus, unspecified: Secondary | ICD-10-CM | POA: Diagnosis not present

## 2015-03-03 DIAGNOSIS — D485 Neoplasm of uncertain behavior of skin: Secondary | ICD-10-CM | POA: Diagnosis not present

## 2015-06-02 DIAGNOSIS — L439 Lichen planus, unspecified: Secondary | ICD-10-CM | POA: Diagnosis not present

## 2015-06-13 DIAGNOSIS — K640 First degree hemorrhoids: Secondary | ICD-10-CM | POA: Diagnosis not present

## 2015-06-13 DIAGNOSIS — J209 Acute bronchitis, unspecified: Secondary | ICD-10-CM | POA: Diagnosis not present

## 2015-08-19 DIAGNOSIS — M1991 Primary osteoarthritis, unspecified site: Secondary | ICD-10-CM | POA: Diagnosis not present

## 2015-08-19 DIAGNOSIS — E782 Mixed hyperlipidemia: Secondary | ICD-10-CM | POA: Diagnosis not present

## 2015-08-19 DIAGNOSIS — I10 Essential (primary) hypertension: Secondary | ICD-10-CM | POA: Diagnosis not present

## 2015-08-19 DIAGNOSIS — E119 Type 2 diabetes mellitus without complications: Secondary | ICD-10-CM | POA: Diagnosis not present

## 2015-08-19 DIAGNOSIS — E559 Vitamin D deficiency, unspecified: Secondary | ICD-10-CM | POA: Diagnosis not present

## 2015-08-24 DIAGNOSIS — L280 Lichen simplex chronicus: Secondary | ICD-10-CM | POA: Diagnosis not present

## 2015-08-24 DIAGNOSIS — L439 Lichen planus, unspecified: Secondary | ICD-10-CM | POA: Diagnosis not present

## 2015-08-24 DIAGNOSIS — D485 Neoplasm of uncertain behavior of skin: Secondary | ICD-10-CM | POA: Diagnosis not present

## 2015-08-24 DIAGNOSIS — L309 Dermatitis, unspecified: Secondary | ICD-10-CM | POA: Diagnosis not present

## 2015-08-24 DIAGNOSIS — E782 Mixed hyperlipidemia: Secondary | ICD-10-CM | POA: Diagnosis not present

## 2015-08-24 DIAGNOSIS — E119 Type 2 diabetes mellitus without complications: Secondary | ICD-10-CM | POA: Diagnosis not present

## 2015-08-24 DIAGNOSIS — K640 First degree hemorrhoids: Secondary | ICD-10-CM | POA: Diagnosis not present

## 2015-08-24 DIAGNOSIS — I10 Essential (primary) hypertension: Secondary | ICD-10-CM | POA: Diagnosis not present

## 2015-08-24 DIAGNOSIS — C61 Malignant neoplasm of prostate: Secondary | ICD-10-CM | POA: Diagnosis not present

## 2015-08-24 DIAGNOSIS — F5221 Male erectile disorder: Secondary | ICD-10-CM | POA: Diagnosis not present

## 2015-09-06 DIAGNOSIS — E119 Type 2 diabetes mellitus without complications: Secondary | ICD-10-CM | POA: Diagnosis not present

## 2015-09-06 DIAGNOSIS — H2513 Age-related nuclear cataract, bilateral: Secondary | ICD-10-CM | POA: Diagnosis not present

## 2015-10-11 DIAGNOSIS — I10 Essential (primary) hypertension: Secondary | ICD-10-CM | POA: Diagnosis not present

## 2015-10-11 DIAGNOSIS — R05 Cough: Secondary | ICD-10-CM | POA: Diagnosis not present

## 2015-10-11 DIAGNOSIS — L439 Lichen planus, unspecified: Secondary | ICD-10-CM | POA: Diagnosis not present

## 2015-10-11 DIAGNOSIS — E119 Type 2 diabetes mellitus without complications: Secondary | ICD-10-CM | POA: Diagnosis not present

## 2015-10-11 DIAGNOSIS — L280 Lichen simplex chronicus: Secondary | ICD-10-CM | POA: Diagnosis not present

## 2015-10-11 DIAGNOSIS — Z6834 Body mass index (BMI) 34.0-34.9, adult: Secondary | ICD-10-CM | POA: Diagnosis not present

## 2015-10-11 DIAGNOSIS — C61 Malignant neoplasm of prostate: Secondary | ICD-10-CM | POA: Diagnosis not present

## 2015-10-21 ENCOUNTER — Other Ambulatory Visit: Payer: Self-pay

## 2016-02-17 DIAGNOSIS — E119 Type 2 diabetes mellitus without complications: Secondary | ICD-10-CM | POA: Diagnosis not present

## 2016-02-17 DIAGNOSIS — C61 Malignant neoplasm of prostate: Secondary | ICD-10-CM | POA: Diagnosis not present

## 2016-02-17 DIAGNOSIS — E782 Mixed hyperlipidemia: Secondary | ICD-10-CM | POA: Diagnosis not present

## 2016-02-17 DIAGNOSIS — L918 Other hypertrophic disorders of the skin: Secondary | ICD-10-CM | POA: Diagnosis not present

## 2016-02-17 DIAGNOSIS — I10 Essential (primary) hypertension: Secondary | ICD-10-CM | POA: Diagnosis not present

## 2016-02-17 DIAGNOSIS — E559 Vitamin D deficiency, unspecified: Secondary | ICD-10-CM | POA: Diagnosis not present

## 2016-02-17 DIAGNOSIS — F5221 Male erectile disorder: Secondary | ICD-10-CM | POA: Diagnosis not present

## 2016-02-17 DIAGNOSIS — M1991 Primary osteoarthritis, unspecified site: Secondary | ICD-10-CM | POA: Diagnosis not present

## 2016-02-23 DIAGNOSIS — Z0001 Encounter for general adult medical examination with abnormal findings: Secondary | ICD-10-CM | POA: Diagnosis not present

## 2016-02-23 DIAGNOSIS — I10 Essential (primary) hypertension: Secondary | ICD-10-CM | POA: Diagnosis not present

## 2016-02-23 DIAGNOSIS — Z1389 Encounter for screening for other disorder: Secondary | ICD-10-CM | POA: Diagnosis not present

## 2016-02-23 DIAGNOSIS — E119 Type 2 diabetes mellitus without complications: Secondary | ICD-10-CM | POA: Diagnosis not present

## 2016-02-23 DIAGNOSIS — Z23 Encounter for immunization: Secondary | ICD-10-CM | POA: Diagnosis not present

## 2016-02-23 DIAGNOSIS — C61 Malignant neoplasm of prostate: Secondary | ICD-10-CM | POA: Diagnosis not present

## 2016-02-23 DIAGNOSIS — F5221 Male erectile disorder: Secondary | ICD-10-CM | POA: Diagnosis not present

## 2016-02-23 DIAGNOSIS — E782 Mixed hyperlipidemia: Secondary | ICD-10-CM | POA: Diagnosis not present

## 2016-08-14 DIAGNOSIS — Z6835 Body mass index (BMI) 35.0-35.9, adult: Secondary | ICD-10-CM | POA: Diagnosis not present

## 2016-08-14 DIAGNOSIS — I10 Essential (primary) hypertension: Secondary | ICD-10-CM | POA: Diagnosis not present

## 2016-08-14 DIAGNOSIS — L918 Other hypertrophic disorders of the skin: Secondary | ICD-10-CM | POA: Diagnosis not present

## 2016-08-14 DIAGNOSIS — C61 Malignant neoplasm of prostate: Secondary | ICD-10-CM | POA: Diagnosis not present

## 2016-08-14 DIAGNOSIS — E782 Mixed hyperlipidemia: Secondary | ICD-10-CM | POA: Diagnosis not present

## 2016-08-14 DIAGNOSIS — F5221 Male erectile disorder: Secondary | ICD-10-CM | POA: Diagnosis not present

## 2016-08-14 DIAGNOSIS — L439 Lichen planus, unspecified: Secondary | ICD-10-CM | POA: Diagnosis not present

## 2016-08-14 DIAGNOSIS — E119 Type 2 diabetes mellitus without complications: Secondary | ICD-10-CM | POA: Diagnosis not present

## 2016-09-26 DIAGNOSIS — R972 Elevated prostate specific antigen [PSA]: Secondary | ICD-10-CM | POA: Diagnosis not present

## 2016-10-03 DIAGNOSIS — Z8546 Personal history of malignant neoplasm of prostate: Secondary | ICD-10-CM | POA: Diagnosis not present

## 2016-10-03 DIAGNOSIS — N5201 Erectile dysfunction due to arterial insufficiency: Secondary | ICD-10-CM | POA: Diagnosis not present

## 2016-10-10 DIAGNOSIS — L439 Lichen planus, unspecified: Secondary | ICD-10-CM | POA: Diagnosis not present

## 2017-02-22 DIAGNOSIS — M1991 Primary osteoarthritis, unspecified site: Secondary | ICD-10-CM | POA: Diagnosis not present

## 2017-02-22 DIAGNOSIS — Z0001 Encounter for general adult medical examination with abnormal findings: Secondary | ICD-10-CM | POA: Diagnosis not present

## 2017-02-22 DIAGNOSIS — I10 Essential (primary) hypertension: Secondary | ICD-10-CM | POA: Diagnosis not present

## 2017-02-22 DIAGNOSIS — C61 Malignant neoplasm of prostate: Secondary | ICD-10-CM | POA: Diagnosis not present

## 2017-02-22 DIAGNOSIS — E119 Type 2 diabetes mellitus without complications: Secondary | ICD-10-CM | POA: Diagnosis not present

## 2017-02-22 DIAGNOSIS — E782 Mixed hyperlipidemia: Secondary | ICD-10-CM | POA: Diagnosis not present

## 2017-02-22 DIAGNOSIS — Z6836 Body mass index (BMI) 36.0-36.9, adult: Secondary | ICD-10-CM | POA: Diagnosis not present

## 2017-02-27 DIAGNOSIS — C61 Malignant neoplasm of prostate: Secondary | ICD-10-CM | POA: Diagnosis not present

## 2017-02-27 DIAGNOSIS — M7121 Synovial cyst of popliteal space [Baker], right knee: Secondary | ICD-10-CM | POA: Diagnosis not present

## 2017-02-27 DIAGNOSIS — I10 Essential (primary) hypertension: Secondary | ICD-10-CM | POA: Diagnosis not present

## 2017-02-27 DIAGNOSIS — E119 Type 2 diabetes mellitus without complications: Secondary | ICD-10-CM | POA: Diagnosis not present

## 2017-02-27 DIAGNOSIS — Z6836 Body mass index (BMI) 36.0-36.9, adult: Secondary | ICD-10-CM | POA: Diagnosis not present

## 2017-02-27 DIAGNOSIS — M170 Bilateral primary osteoarthritis of knee: Secondary | ICD-10-CM | POA: Diagnosis not present

## 2017-02-27 DIAGNOSIS — E782 Mixed hyperlipidemia: Secondary | ICD-10-CM | POA: Diagnosis not present

## 2017-02-27 DIAGNOSIS — Z0001 Encounter for general adult medical examination with abnormal findings: Secondary | ICD-10-CM | POA: Diagnosis not present

## 2017-08-27 DIAGNOSIS — I10 Essential (primary) hypertension: Secondary | ICD-10-CM | POA: Diagnosis not present

## 2017-08-27 DIAGNOSIS — Z6834 Body mass index (BMI) 34.0-34.9, adult: Secondary | ICD-10-CM | POA: Diagnosis not present

## 2017-08-27 DIAGNOSIS — C61 Malignant neoplasm of prostate: Secondary | ICD-10-CM | POA: Diagnosis not present

## 2017-08-27 DIAGNOSIS — M170 Bilateral primary osteoarthritis of knee: Secondary | ICD-10-CM | POA: Diagnosis not present

## 2017-08-27 DIAGNOSIS — E782 Mixed hyperlipidemia: Secondary | ICD-10-CM | POA: Diagnosis not present

## 2017-08-27 DIAGNOSIS — E119 Type 2 diabetes mellitus without complications: Secondary | ICD-10-CM | POA: Diagnosis not present

## 2017-09-30 DIAGNOSIS — Z8546 Personal history of malignant neoplasm of prostate: Secondary | ICD-10-CM | POA: Diagnosis not present

## 2017-10-02 DIAGNOSIS — L439 Lichen planus, unspecified: Secondary | ICD-10-CM | POA: Diagnosis not present

## 2017-10-02 DIAGNOSIS — B353 Tinea pedis: Secondary | ICD-10-CM | POA: Diagnosis not present

## 2017-10-02 DIAGNOSIS — Z79899 Other long term (current) drug therapy: Secondary | ICD-10-CM | POA: Diagnosis not present

## 2017-10-07 DIAGNOSIS — N3946 Mixed incontinence: Secondary | ICD-10-CM | POA: Diagnosis not present

## 2017-10-07 DIAGNOSIS — Z8546 Personal history of malignant neoplasm of prostate: Secondary | ICD-10-CM | POA: Diagnosis not present

## 2017-10-07 DIAGNOSIS — N5231 Erectile dysfunction following radical prostatectomy: Secondary | ICD-10-CM | POA: Diagnosis not present

## 2017-10-07 DIAGNOSIS — R8279 Other abnormal findings on microbiological examination of urine: Secondary | ICD-10-CM | POA: Diagnosis not present

## 2017-10-07 DIAGNOSIS — R8271 Bacteriuria: Secondary | ICD-10-CM | POA: Diagnosis not present

## 2017-10-09 DIAGNOSIS — R8271 Bacteriuria: Secondary | ICD-10-CM | POA: Diagnosis not present

## 2017-11-11 DIAGNOSIS — R8271 Bacteriuria: Secondary | ICD-10-CM | POA: Diagnosis not present

## 2017-12-05 DIAGNOSIS — R8271 Bacteriuria: Secondary | ICD-10-CM | POA: Diagnosis not present

## 2018-01-13 ENCOUNTER — Other Ambulatory Visit: Payer: Self-pay

## 2018-02-28 DIAGNOSIS — C61 Malignant neoplasm of prostate: Secondary | ICD-10-CM | POA: Diagnosis not present

## 2018-02-28 DIAGNOSIS — I10 Essential (primary) hypertension: Secondary | ICD-10-CM | POA: Diagnosis not present

## 2018-02-28 DIAGNOSIS — E782 Mixed hyperlipidemia: Secondary | ICD-10-CM | POA: Diagnosis not present

## 2018-02-28 DIAGNOSIS — M1991 Primary osteoarthritis, unspecified site: Secondary | ICD-10-CM | POA: Diagnosis not present

## 2018-02-28 DIAGNOSIS — E119 Type 2 diabetes mellitus without complications: Secondary | ICD-10-CM | POA: Diagnosis not present

## 2018-03-04 DIAGNOSIS — R319 Hematuria, unspecified: Secondary | ICD-10-CM | POA: Diagnosis not present

## 2018-03-04 DIAGNOSIS — Z6835 Body mass index (BMI) 35.0-35.9, adult: Secondary | ICD-10-CM | POA: Diagnosis not present

## 2018-03-04 DIAGNOSIS — M7121 Synovial cyst of popliteal space [Baker], right knee: Secondary | ICD-10-CM | POA: Diagnosis not present

## 2018-03-04 DIAGNOSIS — I10 Essential (primary) hypertension: Secondary | ICD-10-CM | POA: Diagnosis not present

## 2018-03-04 DIAGNOSIS — Z0001 Encounter for general adult medical examination with abnormal findings: Secondary | ICD-10-CM | POA: Diagnosis not present

## 2018-03-18 DIAGNOSIS — R319 Hematuria, unspecified: Secondary | ICD-10-CM | POA: Diagnosis not present

## 2018-04-10 DIAGNOSIS — E11319 Type 2 diabetes mellitus with unspecified diabetic retinopathy without macular edema: Secondary | ICD-10-CM | POA: Diagnosis not present

## 2018-08-27 DIAGNOSIS — I10 Essential (primary) hypertension: Secondary | ICD-10-CM | POA: Diagnosis not present

## 2018-08-27 DIAGNOSIS — E782 Mixed hyperlipidemia: Secondary | ICD-10-CM | POA: Diagnosis not present

## 2018-08-27 DIAGNOSIS — E119 Type 2 diabetes mellitus without complications: Secondary | ICD-10-CM | POA: Diagnosis not present

## 2018-08-29 DIAGNOSIS — Z1389 Encounter for screening for other disorder: Secondary | ICD-10-CM | POA: Diagnosis not present

## 2018-08-29 DIAGNOSIS — E7800 Pure hypercholesterolemia, unspecified: Secondary | ICD-10-CM | POA: Diagnosis not present

## 2018-08-29 DIAGNOSIS — E1169 Type 2 diabetes mellitus with other specified complication: Secondary | ICD-10-CM | POA: Diagnosis not present

## 2018-08-29 DIAGNOSIS — Z6834 Body mass index (BMI) 34.0-34.9, adult: Secondary | ICD-10-CM | POA: Diagnosis not present

## 2018-08-29 DIAGNOSIS — K219 Gastro-esophageal reflux disease without esophagitis: Secondary | ICD-10-CM | POA: Diagnosis not present

## 2018-08-29 DIAGNOSIS — C61 Malignant neoplasm of prostate: Secondary | ICD-10-CM | POA: Diagnosis not present

## 2018-08-29 DIAGNOSIS — K5900 Constipation, unspecified: Secondary | ICD-10-CM | POA: Diagnosis not present

## 2018-08-29 DIAGNOSIS — I10 Essential (primary) hypertension: Secondary | ICD-10-CM | POA: Diagnosis not present

## 2018-09-26 DIAGNOSIS — I10 Essential (primary) hypertension: Secondary | ICD-10-CM | POA: Diagnosis not present

## 2018-09-26 DIAGNOSIS — E7800 Pure hypercholesterolemia, unspecified: Secondary | ICD-10-CM | POA: Diagnosis not present

## 2018-09-30 DIAGNOSIS — Z8546 Personal history of malignant neoplasm of prostate: Secondary | ICD-10-CM | POA: Diagnosis not present

## 2018-10-01 DIAGNOSIS — L439 Lichen planus, unspecified: Secondary | ICD-10-CM | POA: Diagnosis not present

## 2018-10-01 DIAGNOSIS — B353 Tinea pedis: Secondary | ICD-10-CM | POA: Diagnosis not present

## 2018-10-08 DIAGNOSIS — N5231 Erectile dysfunction following radical prostatectomy: Secondary | ICD-10-CM | POA: Diagnosis not present

## 2018-10-08 DIAGNOSIS — N3946 Mixed incontinence: Secondary | ICD-10-CM | POA: Diagnosis not present

## 2018-10-08 DIAGNOSIS — Z8546 Personal history of malignant neoplasm of prostate: Secondary | ICD-10-CM | POA: Diagnosis not present

## 2018-10-09 DIAGNOSIS — M79651 Pain in right thigh: Secondary | ICD-10-CM | POA: Diagnosis not present

## 2018-10-09 DIAGNOSIS — K219 Gastro-esophageal reflux disease without esophagitis: Secondary | ICD-10-CM | POA: Diagnosis not present

## 2018-10-09 DIAGNOSIS — E1169 Type 2 diabetes mellitus with other specified complication: Secondary | ICD-10-CM | POA: Diagnosis not present

## 2018-10-09 DIAGNOSIS — K5900 Constipation, unspecified: Secondary | ICD-10-CM | POA: Diagnosis not present

## 2018-10-09 DIAGNOSIS — C61 Malignant neoplasm of prostate: Secondary | ICD-10-CM | POA: Diagnosis not present

## 2018-10-09 DIAGNOSIS — Z6835 Body mass index (BMI) 35.0-35.9, adult: Secondary | ICD-10-CM | POA: Diagnosis not present

## 2018-10-09 DIAGNOSIS — I10 Essential (primary) hypertension: Secondary | ICD-10-CM | POA: Diagnosis not present

## 2018-10-09 DIAGNOSIS — D126 Benign neoplasm of colon, unspecified: Secondary | ICD-10-CM | POA: Diagnosis not present

## 2018-10-15 DIAGNOSIS — R600 Localized edema: Secondary | ICD-10-CM | POA: Diagnosis not present

## 2018-10-15 DIAGNOSIS — R609 Edema, unspecified: Secondary | ICD-10-CM | POA: Diagnosis not present

## 2018-10-15 DIAGNOSIS — M79651 Pain in right thigh: Secondary | ICD-10-CM | POA: Diagnosis not present

## 2018-11-26 DIAGNOSIS — I10 Essential (primary) hypertension: Secondary | ICD-10-CM | POA: Diagnosis not present

## 2018-11-26 DIAGNOSIS — E782 Mixed hyperlipidemia: Secondary | ICD-10-CM | POA: Diagnosis not present

## 2019-03-06 DIAGNOSIS — Z6834 Body mass index (BMI) 34.0-34.9, adult: Secondary | ICD-10-CM | POA: Diagnosis not present

## 2019-03-06 DIAGNOSIS — K219 Gastro-esophageal reflux disease without esophagitis: Secondary | ICD-10-CM | POA: Diagnosis not present

## 2019-03-06 DIAGNOSIS — Z0001 Encounter for general adult medical examination with abnormal findings: Secondary | ICD-10-CM | POA: Diagnosis not present

## 2019-03-06 DIAGNOSIS — E782 Mixed hyperlipidemia: Secondary | ICD-10-CM | POA: Diagnosis not present

## 2019-03-06 DIAGNOSIS — I10 Essential (primary) hypertension: Secondary | ICD-10-CM | POA: Diagnosis not present

## 2019-03-06 DIAGNOSIS — E7800 Pure hypercholesterolemia, unspecified: Secondary | ICD-10-CM | POA: Diagnosis not present

## 2019-03-06 DIAGNOSIS — E1169 Type 2 diabetes mellitus with other specified complication: Secondary | ICD-10-CM | POA: Diagnosis not present

## 2019-03-17 DIAGNOSIS — I10 Essential (primary) hypertension: Secondary | ICD-10-CM | POA: Diagnosis not present

## 2019-03-17 DIAGNOSIS — C61 Malignant neoplasm of prostate: Secondary | ICD-10-CM | POA: Diagnosis not present

## 2019-03-17 DIAGNOSIS — K219 Gastro-esophageal reflux disease without esophagitis: Secondary | ICD-10-CM | POA: Diagnosis not present

## 2019-03-17 DIAGNOSIS — M722 Plantar fascial fibromatosis: Secondary | ICD-10-CM | POA: Diagnosis not present

## 2019-03-17 DIAGNOSIS — Z6835 Body mass index (BMI) 35.0-35.9, adult: Secondary | ICD-10-CM | POA: Diagnosis not present

## 2019-03-17 DIAGNOSIS — E1169 Type 2 diabetes mellitus with other specified complication: Secondary | ICD-10-CM | POA: Diagnosis not present

## 2019-03-17 DIAGNOSIS — M170 Bilateral primary osteoarthritis of knee: Secondary | ICD-10-CM | POA: Diagnosis not present

## 2019-03-17 DIAGNOSIS — D126 Benign neoplasm of colon, unspecified: Secondary | ICD-10-CM | POA: Diagnosis not present

## 2019-04-24 DIAGNOSIS — E7801 Familial hypercholesterolemia: Secondary | ICD-10-CM | POA: Diagnosis not present

## 2019-04-24 DIAGNOSIS — I10 Essential (primary) hypertension: Secondary | ICD-10-CM | POA: Diagnosis not present

## 2019-05-28 ENCOUNTER — Ambulatory Visit: Payer: Medicare Other | Attending: Internal Medicine

## 2019-05-28 DIAGNOSIS — Z23 Encounter for immunization: Secondary | ICD-10-CM

## 2019-05-28 NOTE — Progress Notes (Signed)
   Covid-19 Vaccination Clinic  Name:  Robert Bishop    MRN: [id number] DOB: 1945-09-01  05/28/2019  [name] was observed post Covid-19 immunization for 15 minutes without incident. He was provided with Vaccine Information Sheet and instruction to access the V-Safe system.   [name] was instructed to call 911 with any severe reactions post vaccine: Marland Kitchen Difficulty breathing  . Swelling of face and throat  . A fast heartbeat  . A bad rash all over body  . Dizziness and weakness   Immunizations Administered    Name Date Dose VIS Date Route   Moderna COVID-19 Vaccine 05/28/2019 10:42 AM 0.5 mL 01/27/2019 Intramuscular   Manufacturer: Moderna   Lot: [id number]   [id number]

## 2019-06-25 ENCOUNTER — Ambulatory Visit: Payer: Medicare Other | Attending: Internal Medicine

## 2019-06-25 DIAGNOSIS — Z23 Encounter for immunization: Secondary | ICD-10-CM

## 2019-06-25 NOTE — Progress Notes (Signed)
   Covid-19 Vaccination Clinic  Name:  Robert Bishop    MRN: [id number] DOB: 1945-06-11  06/25/2019  Robert Bishop was observed post Covid-19 immunization for 15 minutes without incident. He was provided with Vaccine Information Sheet and instruction to access the V-Safe system.   Robert Bishop was instructed to call 911 with any severe reactions post vaccine: Marland Kitchen Difficulty breathing  . Swelling of face and throat  . A fast heartbeat  . A bad rash all over body  . Dizziness and weakness   Immunizations Administered    Name Date Dose VIS Date Route   Moderna COVID-19 Vaccine 06/25/2019 10:22 AM 0.5 mL 01/2019 Intramuscular   Manufacturer: Moderna   Lot: [id number]   [id number]

## 2019-07-27 DIAGNOSIS — Z7984 Long term (current) use of oral hypoglycemic drugs: Secondary | ICD-10-CM | POA: Diagnosis not present

## 2019-07-27 DIAGNOSIS — I10 Essential (primary) hypertension: Secondary | ICD-10-CM | POA: Diagnosis not present

## 2019-07-27 DIAGNOSIS — E7849 Other hyperlipidemia: Secondary | ICD-10-CM | POA: Diagnosis not present

## 2019-07-27 DIAGNOSIS — K219 Gastro-esophageal reflux disease without esophagitis: Secondary | ICD-10-CM | POA: Diagnosis not present

## 2019-07-27 DIAGNOSIS — E1169 Type 2 diabetes mellitus with other specified complication: Secondary | ICD-10-CM | POA: Diagnosis not present

## 2019-09-09 DIAGNOSIS — D126 Benign neoplasm of colon, unspecified: Secondary | ICD-10-CM | POA: Diagnosis not present

## 2019-09-09 DIAGNOSIS — C61 Malignant neoplasm of prostate: Secondary | ICD-10-CM | POA: Diagnosis not present

## 2019-09-09 DIAGNOSIS — I10 Essential (primary) hypertension: Secondary | ICD-10-CM | POA: Diagnosis not present

## 2019-09-09 DIAGNOSIS — B356 Tinea cruris: Secondary | ICD-10-CM | POA: Diagnosis not present

## 2019-09-09 DIAGNOSIS — Z6833 Body mass index (BMI) 33.0-33.9, adult: Secondary | ICD-10-CM | POA: Diagnosis not present

## 2019-09-09 DIAGNOSIS — E1169 Type 2 diabetes mellitus with other specified complication: Secondary | ICD-10-CM | POA: Diagnosis not present

## 2019-09-09 DIAGNOSIS — K219 Gastro-esophageal reflux disease without esophagitis: Secondary | ICD-10-CM | POA: Diagnosis not present

## 2019-09-09 DIAGNOSIS — M722 Plantar fascial fibromatosis: Secondary | ICD-10-CM | POA: Diagnosis not present

## 2019-10-19 DIAGNOSIS — N3946 Mixed incontinence: Secondary | ICD-10-CM | POA: Diagnosis not present

## 2019-10-19 DIAGNOSIS — N5231 Erectile dysfunction following radical prostatectomy: Secondary | ICD-10-CM | POA: Diagnosis not present

## 2019-10-19 DIAGNOSIS — R8271 Bacteriuria: Secondary | ICD-10-CM | POA: Diagnosis not present

## 2019-10-19 DIAGNOSIS — Z8546 Personal history of malignant neoplasm of prostate: Secondary | ICD-10-CM | POA: Diagnosis not present

## 2019-10-27 DIAGNOSIS — Z7984 Long term (current) use of oral hypoglycemic drugs: Secondary | ICD-10-CM | POA: Diagnosis not present

## 2019-10-27 DIAGNOSIS — I10 Essential (primary) hypertension: Secondary | ICD-10-CM | POA: Diagnosis not present

## 2019-10-27 DIAGNOSIS — E1169 Type 2 diabetes mellitus with other specified complication: Secondary | ICD-10-CM | POA: Diagnosis not present

## 2019-10-27 DIAGNOSIS — E7849 Other hyperlipidemia: Secondary | ICD-10-CM | POA: Diagnosis not present

## 2019-11-26 DIAGNOSIS — E7849 Other hyperlipidemia: Secondary | ICD-10-CM | POA: Diagnosis not present

## 2019-11-26 DIAGNOSIS — I10 Essential (primary) hypertension: Secondary | ICD-10-CM | POA: Diagnosis not present

## 2019-11-26 DIAGNOSIS — E1169 Type 2 diabetes mellitus with other specified complication: Secondary | ICD-10-CM | POA: Diagnosis not present

## 2019-11-26 DIAGNOSIS — Z7984 Long term (current) use of oral hypoglycemic drugs: Secondary | ICD-10-CM | POA: Diagnosis not present

## 2019-12-31 DIAGNOSIS — Z6831 Body mass index (BMI) 31.0-31.9, adult: Secondary | ICD-10-CM | POA: Diagnosis not present

## 2019-12-31 DIAGNOSIS — M722 Plantar fascial fibromatosis: Secondary | ICD-10-CM | POA: Diagnosis not present

## 2019-12-31 DIAGNOSIS — M1711 Unilateral primary osteoarthritis, right knee: Secondary | ICD-10-CM | POA: Diagnosis not present

## 2019-12-31 DIAGNOSIS — Z23 Encounter for immunization: Secondary | ICD-10-CM | POA: Diagnosis not present

## 2020-01-26 DIAGNOSIS — Z7984 Long term (current) use of oral hypoglycemic drugs: Secondary | ICD-10-CM | POA: Diagnosis not present

## 2020-01-26 DIAGNOSIS — E7849 Other hyperlipidemia: Secondary | ICD-10-CM | POA: Diagnosis not present

## 2020-01-26 DIAGNOSIS — I10 Essential (primary) hypertension: Secondary | ICD-10-CM | POA: Diagnosis not present

## 2020-01-26 DIAGNOSIS — E1169 Type 2 diabetes mellitus with other specified complication: Secondary | ICD-10-CM | POA: Diagnosis not present

## 2020-01-27 DIAGNOSIS — M1711 Unilateral primary osteoarthritis, right knee: Secondary | ICD-10-CM | POA: Diagnosis not present

## 2020-01-27 DIAGNOSIS — N4889 Other specified disorders of penis: Secondary | ICD-10-CM | POA: Diagnosis not present

## 2020-01-27 DIAGNOSIS — Z6832 Body mass index (BMI) 32.0-32.9, adult: Secondary | ICD-10-CM | POA: Diagnosis not present

## 2020-01-27 DIAGNOSIS — M722 Plantar fascial fibromatosis: Secondary | ICD-10-CM | POA: Diagnosis not present

## 2020-01-27 DIAGNOSIS — R0781 Pleurodynia: Secondary | ICD-10-CM | POA: Diagnosis not present

## 2020-03-03 DIAGNOSIS — K644 Residual hemorrhoidal skin tags: Secondary | ICD-10-CM | POA: Diagnosis not present

## 2020-03-03 DIAGNOSIS — Z86010 Personal history of colonic polyps: Secondary | ICD-10-CM | POA: Diagnosis not present

## 2020-03-17 DIAGNOSIS — E782 Mixed hyperlipidemia: Secondary | ICD-10-CM | POA: Diagnosis not present

## 2020-03-17 DIAGNOSIS — Z9189 Other specified personal risk factors, not elsewhere classified: Secondary | ICD-10-CM | POA: Diagnosis not present

## 2020-03-17 DIAGNOSIS — Z1159 Encounter for screening for other viral diseases: Secondary | ICD-10-CM | POA: Diagnosis not present

## 2020-03-17 DIAGNOSIS — K219 Gastro-esophageal reflux disease without esophagitis: Secondary | ICD-10-CM | POA: Diagnosis not present

## 2020-03-17 DIAGNOSIS — Z6833 Body mass index (BMI) 33.0-33.9, adult: Secondary | ICD-10-CM | POA: Diagnosis not present

## 2020-03-17 DIAGNOSIS — Z0001 Encounter for general adult medical examination with abnormal findings: Secondary | ICD-10-CM | POA: Diagnosis not present

## 2020-03-17 DIAGNOSIS — I10 Essential (primary) hypertension: Secondary | ICD-10-CM | POA: Diagnosis not present

## 2020-03-17 DIAGNOSIS — E7849 Other hyperlipidemia: Secondary | ICD-10-CM | POA: Diagnosis not present

## 2020-03-17 DIAGNOSIS — E1169 Type 2 diabetes mellitus with other specified complication: Secondary | ICD-10-CM | POA: Diagnosis not present

## 2020-03-22 DIAGNOSIS — D126 Benign neoplasm of colon, unspecified: Secondary | ICD-10-CM | POA: Diagnosis not present

## 2020-03-22 DIAGNOSIS — Z6833 Body mass index (BMI) 33.0-33.9, adult: Secondary | ICD-10-CM | POA: Diagnosis not present

## 2020-03-22 DIAGNOSIS — I10 Essential (primary) hypertension: Secondary | ICD-10-CM | POA: Diagnosis not present

## 2020-03-22 DIAGNOSIS — M722 Plantar fascial fibromatosis: Secondary | ICD-10-CM | POA: Diagnosis not present

## 2020-03-22 DIAGNOSIS — K5730 Diverticulosis of large intestine without perforation or abscess without bleeding: Secondary | ICD-10-CM | POA: Diagnosis not present

## 2020-03-22 DIAGNOSIS — E1169 Type 2 diabetes mellitus with other specified complication: Secondary | ICD-10-CM | POA: Diagnosis not present

## 2020-03-22 DIAGNOSIS — C61 Malignant neoplasm of prostate: Secondary | ICD-10-CM | POA: Diagnosis not present

## 2020-03-22 DIAGNOSIS — E7849 Other hyperlipidemia: Secondary | ICD-10-CM | POA: Diagnosis not present

## 2020-04-25 DIAGNOSIS — Z7984 Long term (current) use of oral hypoglycemic drugs: Secondary | ICD-10-CM | POA: Diagnosis not present

## 2020-04-25 DIAGNOSIS — K219 Gastro-esophageal reflux disease without esophagitis: Secondary | ICD-10-CM | POA: Diagnosis not present

## 2020-04-25 DIAGNOSIS — I10 Essential (primary) hypertension: Secondary | ICD-10-CM | POA: Diagnosis not present

## 2020-04-25 DIAGNOSIS — E7849 Other hyperlipidemia: Secondary | ICD-10-CM | POA: Diagnosis not present

## 2020-04-25 DIAGNOSIS — E1169 Type 2 diabetes mellitus with other specified complication: Secondary | ICD-10-CM | POA: Diagnosis not present

## 2020-04-29 DIAGNOSIS — I10 Essential (primary) hypertension: Secondary | ICD-10-CM | POA: Diagnosis not present

## 2020-04-29 DIAGNOSIS — M7121 Synovial cyst of popliteal space [Baker], right knee: Secondary | ICD-10-CM | POA: Diagnosis not present

## 2020-04-29 DIAGNOSIS — M1711 Unilateral primary osteoarthritis, right knee: Secondary | ICD-10-CM | POA: Diagnosis not present

## 2020-04-29 DIAGNOSIS — Z6834 Body mass index (BMI) 34.0-34.9, adult: Secondary | ICD-10-CM | POA: Diagnosis not present

## 2020-05-11 DIAGNOSIS — H35033 Hypertensive retinopathy, bilateral: Secondary | ICD-10-CM | POA: Diagnosis not present

## 2020-05-25 DIAGNOSIS — E1169 Type 2 diabetes mellitus with other specified complication: Secondary | ICD-10-CM | POA: Diagnosis not present

## 2020-05-25 DIAGNOSIS — K219 Gastro-esophageal reflux disease without esophagitis: Secondary | ICD-10-CM | POA: Diagnosis not present

## 2020-05-25 DIAGNOSIS — I10 Essential (primary) hypertension: Secondary | ICD-10-CM | POA: Diagnosis not present

## 2020-05-25 DIAGNOSIS — Z7984 Long term (current) use of oral hypoglycemic drugs: Secondary | ICD-10-CM | POA: Diagnosis not present

## 2020-05-25 DIAGNOSIS — E7849 Other hyperlipidemia: Secondary | ICD-10-CM | POA: Diagnosis not present

## 2020-06-25 DIAGNOSIS — K219 Gastro-esophageal reflux disease without esophagitis: Secondary | ICD-10-CM | POA: Diagnosis not present

## 2020-06-25 DIAGNOSIS — I10 Essential (primary) hypertension: Secondary | ICD-10-CM | POA: Diagnosis not present

## 2020-06-25 DIAGNOSIS — Z7984 Long term (current) use of oral hypoglycemic drugs: Secondary | ICD-10-CM | POA: Diagnosis not present

## 2020-06-25 DIAGNOSIS — E7849 Other hyperlipidemia: Secondary | ICD-10-CM | POA: Diagnosis not present

## 2020-06-25 DIAGNOSIS — E1169 Type 2 diabetes mellitus with other specified complication: Secondary | ICD-10-CM | POA: Diagnosis not present

## 2020-07-04 DIAGNOSIS — Z6833 Body mass index (BMI) 33.0-33.9, adult: Secondary | ICD-10-CM | POA: Diagnosis not present

## 2020-07-04 DIAGNOSIS — M1711 Unilateral primary osteoarthritis, right knee: Secondary | ICD-10-CM | POA: Diagnosis not present

## 2020-07-04 DIAGNOSIS — M7121 Synovial cyst of popliteal space [Baker], right knee: Secondary | ICD-10-CM | POA: Diagnosis not present

## 2020-07-04 DIAGNOSIS — I10 Essential (primary) hypertension: Secondary | ICD-10-CM | POA: Diagnosis not present

## 2020-07-06 DIAGNOSIS — M25561 Pain in right knee: Secondary | ICD-10-CM | POA: Diagnosis not present

## 2020-07-06 DIAGNOSIS — G8929 Other chronic pain: Secondary | ICD-10-CM | POA: Diagnosis not present

## 2020-07-14 ENCOUNTER — Other Ambulatory Visit: Payer: Self-pay

## 2020-07-14 ENCOUNTER — Ambulatory Visit: Payer: Medicare Other | Admitting: Orthopedic Surgery

## 2020-07-20 ENCOUNTER — Other Ambulatory Visit: Payer: Self-pay

## 2020-07-20 ENCOUNTER — Encounter: Payer: Self-pay | Admitting: Orthopedic Surgery

## 2020-07-20 ENCOUNTER — Ambulatory Visit: Payer: Medicare Other

## 2020-07-20 ENCOUNTER — Ambulatory Visit (INDEPENDENT_AMBULATORY_CARE_PROVIDER_SITE_OTHER): Payer: Medicare Other | Admitting: Orthopedic Surgery

## 2020-07-20 VITALS — BP 148/82 | HR 55 | Ht 68.0 in | Wt 225.0 lb

## 2020-07-20 DIAGNOSIS — M1711 Unilateral primary osteoarthritis, right knee: Secondary | ICD-10-CM

## 2020-07-20 DIAGNOSIS — G8929 Other chronic pain: Secondary | ICD-10-CM

## 2020-07-20 DIAGNOSIS — M25561 Pain in right knee: Secondary | ICD-10-CM

## 2020-07-20 NOTE — Addendum Note (Signed)
Addended byCandice Camp on: 07/20/2020 11:53 AM   Modules accepted: Orders, SmartSet

## 2020-07-20 NOTE — Patient Instructions (Signed)
You have decided to proceed with knee replacement surgery. You have decided not to continue with nonoperative measures such as but not limited to oral medication, weight loss, activity modification, physical therapy, bracing, or injection.  We will perform the procedure commonly known as total knee replacement. Some of the risks associated with knee replacement surgery include but are not limited to Bleeding Infection Swelling Stiffness Blood clot Pulmonary embolism  Loosening of the implant Pain that persists even after surgery  Infection is especially devastating complication of knee surgery although rare. If infection does occur your implant will usually have to be removed and several surgeries and antibiotics will be needed to eradicate the infection prior to performing a repeat replacement.   In some cases amputation is required to eradicate the infection. In other rare cases a knee fusion is needed   In compliance with recent [place] law in federal regulation regarding opioid use and abuse and addiction, we will taper (stop) opioid medication after 2 weeks.  If you're not comfortable with these risks and would like to continue with nonoperative treatment please let Dr. Neria Procter know prior to your surgery.  

## 2020-07-20 NOTE — Progress Notes (Signed)
MRI Lower Extremity Joint Right Wo Contrast  Anatomical Region Laterality Modality  Ankle right Magnetic Resonance  Knee -- --  Hip -- --    Impression Performed by [facility] RAD 1. Complex tearing and maceration of the medial meniscus body and  posterior horn.  2. Small radial tear of the lateral meniscus posterior horn.  3. Tricompartmental osteoarthritis, moderate to severe in the medial  compartment. 1.4 cm intra-articular body in the posterior joint  space.  4. Small complex Baker cyst containing a 1.7 cm intra-articular  body.    Electronically Signed  By: Titus Dubin M.D.  On: 07/07/2020 10:21  Narrative Performed by [facility] RAD CLINICAL DATA: Chronic right knee pain. No injury or prior surgery.   EXAM:  MRI OF THE LOWER RIGHT JOINT WITHOUT CONTRAST   TECHNIQUE:  Multiplanar, multisequence MR imaging of the right knee was  performed. No intravenous contrast was administered.   COMPARISON: Right knee x-rays dated April 29, 2020.   FINDINGS:  MENISCI   Medial meniscus: Complex tearing and maceration of the body and  posterior horn.   Lateral meniscus: Small radial tear of the posterior horn (series 4,  image 10).   LIGAMENTS   Cruciates: Intact ACL and PCL. Prominent mucoid degeneration of the  ACL.   Collaterals: Intact medial collateral ligament and lateral  collateral ligament complex.   CARTILAGE   Patellofemoral: Moderate diffuse cartilage thinning with scattered  areas of full-thickness cartilage loss.   Medial: Large areas of full-thickness cartilage loss over the  weight-bearing medial femoral condyle and medial tibial plateau.   Lateral: Partial thickness cartilage loss over the peripheral  weight-bearinglateral femoral condyle.   MISCELLANEOUS   Joint: No significant joint effusion. 1.4 cm intra-articular body in  the posterior joint space (series 4, image 19). Normal Hoffa's fat.   Popliteal Fossa: Small complex loculated Baker  cyst containing a 1.7  cm intra-articular body (series 4, image 24). Intact popliteus  tendon.   Extensor Mechanism: Intact quadriceps tendon and patellar tendon.  Intact medial and lateral patellar retinaculum. Intact MPFL.   Bones: Degenerative subchondral marrow edema in the medial tibial  plateau. Tricompartmental marginal osteophytes. No acute fracture or  dislocation. No suspicious bone lesion.   Other: None.  Procedure Note  Robert Cluck, MD - 07/07/2020  Formatting of this note might be different from the original.  CLINICAL DATA: Chronic right knee pain. No injury or prior surgery.   EXAM:  MRI OF THE LOWER RIGHT JOINT WITHOUT CONTRAST   TECHNIQUE:  Multiplanar, multisequence MR imaging of the right knee was  performed. No intravenous contrast was administered.   COMPARISON: Right knee x-rays dated April 29, 2020.   FINDINGS:  MENISCI   Medial meniscus: Complex tearing and maceration of the body and  posterior horn.   Lateral meniscus: Small radial tear of the posterior horn (series 4,  image 10).   LIGAMENTS   Cruciates: Intact ACL and PCL. Prominent mucoid degeneration of the  ACL.   Collaterals: Intact medial collateral ligament and lateral  collateral ligament complex.   CARTILAGE   Patellofemoral: Moderate diffuse cartilage thinning with scattered  areas of full-thickness cartilage loss.   Medial: Large areas of full-thickness cartilage loss over the  weight-bearing medial femoral condyle and medial tibial plateau.   Lateral: Partial thickness cartilage loss over the peripheral  weight-bearing lateral femoral condyle.   MISCELLANEOUS   Joint: No significant joint effusion. 1.4 cm intra-articular body in  the posterior joint space (  series 4, image 19). Normal Hoffa's fat.   Popliteal Fossa: Small complex loculated Baker cyst containing a 1.7  cm intra-articular body (series 4, image 24). Intact popliteus  tendon.   Extensor  Mechanism: Intact quadriceps tendon and patellar tendon.  Intactmedial and lateral patellar retinaculum. Intact MPFL.   Bones: Degenerative subchondral marrow edema in the medial tibial  plateau. Tricompartmental marginal osteophytes. No acute fracture or  dislocation. No suspicious bone lesion.   Other: None.   IMPRESSION:  1. Complex tearing and maceration of the medial meniscus body and  posterior horn.  2. Small radial tear of the lateral meniscus posterior horn.  3. Tricompartmental osteoarthritis, moderate to severe in the medial  compartment. 1.4 cm intra-articular body in the posterior joint  space.  4. Small complex Baker cyst containing a 1.7 cm intra-articular  body.    Electronically Signed   By: Titus Dubin M.D.   On: 07/07/2020 10:21 Exam End: 07/06/20 13:01   Specimen Collected: 07/07/20 10:16 Last Resulted: 07/07/20 10:21  Received From: [facility]  Result Received: 07/12/20 13:27

## 2020-07-20 NOTE — Progress Notes (Signed)
NEW PROBLEM//OFFICE VISIT   Chief Complaint  Patient presents with  . Knee Pain    Right for 2-3 years getting worse     75 year old male progressive knee pain over the last 3 years worsening in the last year.  Complains of medial knee pain loss of function decreased range of motion  Patient says this is interfering with his activities and "I need to do something"   Review of Systems  Respiratory: Negative for shortness of breath.   Cardiovascular: Negative for chest pain.  Gastrointestinal: Negative.   All other systems reviewed and are negative.    Past Medical History:  Diagnosis Date  . Anemia   . Diabetes mellitus without complication ([place])   . GERD (gastroesophageal reflux disease)    pt states no problem since hernia repair  . H/O hiatal hernia   . Hematuria   . History of blood transfusion yrs ago  . Hypertension   . Lesion of bladder   . Wears glasses     Past Surgical History:  Procedure Laterality Date  . CYSTOSCOPY N/A 08/10/2014   Procedure: CYSTOSCOPY FLEXIBLE AND REMOVAL OF FOREIGN BODY ( Vascular Clip);  Surgeon: Irine Seal, MD;  Location: [facility];  Service: Urology;  Laterality: N/A;  . CYSTOSCOPY WITH BIOPSY N/A 06/12/2012   Procedure: CYSTOSCOPY BLADDER BIOPSY WITH FULGURATION;  Surgeon: Malka So, MD;  Location: [facility];  Service: Urology;  Laterality: N/A;  . HERNIA REPAIR     hiatal  . PENILE PROSTHESIS IMPLANT N/A 08/18/2013   Procedure: INSERTION OF COLOPLAST TITAN PENILE PROSTHESIS INFRAPUBIC APPROACH;  Surgeon: Malka So, MD;  Location: [facility];  Service: Urology;  Laterality: N/A;  . ROBOT ASSISTED LAPAROSCOPIC RADICAL PROSTATECTOMY N/A 08/06/2012   Procedure: ROBOTIC ASSISTED LAPAROSCOPIC RADICAL PROSTATECTOMY WITH NODE DISSECTION AND LEFT NERVE SPARE ONLY;  Surgeon: Malka So, MD;  Location: [facility];  Service: Urology;  Laterality: N/A;    History reviewed. No pertinent family history. Social History   Tobacco Use   . Smoking status: Former Smoker    Packs/day: 0.50    Years: 25.00    Pack years: 12.50    Types: Cigarettes    Quit date: 06/12/1990    Years since quitting: 30.1  . Smokeless tobacco: Never Used  Substance Use Topics  . Alcohol use: No    Comment: none x 20 yrs  . Drug use: No    Allergies  Allergen Reactions  . Oxycodone-Acetaminophen Nausea And Vomiting    Current Meds  Medication Sig  . amLODipine (NORVASC) 10 MG tablet Take 1 tablet by mouth daily.  Marland Kitchen aspirin EC 81 MG tablet Take 81 mg by mouth daily.  Marland Kitchen atorvastatin (LIPITOR) 10 MG tablet Take 1 tablet by mouth daily.  . meloxicam (MOBIC) 15 MG tablet Take 1 tablet by mouth daily as needed.  . metFORMIN (GLUMETZA) 500 MG (MOD) 24 hr tablet Take 500 mg by mouth daily with breakfast.    BP (!) 148/82   Pulse (!) 55   Ht 5\' 8"  (1.727 m)   Wt 225 lb (102.1 kg)   BMI 34.21 kg/m   Physical Exam  General appearance: Well-developed well-nourished no gross deformities  Cardiovascular normal pulse and perfusion normal color without edema  Neurologically o sensation loss or deficits or pathologic reflexes  Psychological: Awake alert and oriented x3 mood and affect normal  Skin no lacerations or ulcerations no nodularity no palpable masses, no erythema or nodularity  Musculoskeletal:  Varus deformity right knee medial joint line tenderness fortunately he has retained his motion at 220 degrees minimal flexion contracture there is no pseudolaxity or laxity in the anterior posterior plane of the sagittal or coronal plane  Neurovascular exam is intact no peripheral edema     MEDICAL DECISION MAKING  A. No diagnosis found.  B. DATA ANALYSED:   IMAGING: Interpretation of images: He had an MRI of his knee that shows 3 compartment arthritis and medial lateral meniscal tears however his plain film taken in the office today shows severe arthritis of the medial compartment 6 degree varus deformity 3 compartment  disease    Orders: Right total knee Outside records reviewed: None  C. MANAGEMENT   The procedure has been fully reviewed with the patient; The risks and benefits of surgery have been discussed and explained and understood. Alternative treatment has also been reviewed, questions were encouraged and answered. The postoperative plan is also been reviewed.  He has good home support  Proceed with right total knee  No orders of the defined types were placed in this encounter.     Arther Abbott, MD  07/20/2020 10:59 AM

## 2020-07-21 DIAGNOSIS — Z01810 Encounter for preprocedural cardiovascular examination: Secondary | ICD-10-CM | POA: Diagnosis not present

## 2020-07-21 DIAGNOSIS — E1169 Type 2 diabetes mellitus with other specified complication: Secondary | ICD-10-CM | POA: Diagnosis not present

## 2020-07-21 DIAGNOSIS — M1711 Unilateral primary osteoarthritis, right knee: Secondary | ICD-10-CM | POA: Diagnosis not present

## 2020-07-21 DIAGNOSIS — C61 Malignant neoplasm of prostate: Secondary | ICD-10-CM | POA: Diagnosis not present

## 2020-07-21 DIAGNOSIS — Z6832 Body mass index (BMI) 32.0-32.9, adult: Secondary | ICD-10-CM | POA: Diagnosis not present

## 2020-07-21 DIAGNOSIS — E782 Mixed hyperlipidemia: Secondary | ICD-10-CM | POA: Diagnosis not present

## 2020-07-21 DIAGNOSIS — I10 Essential (primary) hypertension: Secondary | ICD-10-CM | POA: Diagnosis not present

## 2020-07-25 DIAGNOSIS — I10 Essential (primary) hypertension: Secondary | ICD-10-CM | POA: Diagnosis not present

## 2020-07-25 DIAGNOSIS — E7849 Other hyperlipidemia: Secondary | ICD-10-CM | POA: Diagnosis not present

## 2020-07-25 DIAGNOSIS — K219 Gastro-esophageal reflux disease without esophagitis: Secondary | ICD-10-CM | POA: Diagnosis not present

## 2020-07-25 DIAGNOSIS — Z7984 Long term (current) use of oral hypoglycemic drugs: Secondary | ICD-10-CM | POA: Diagnosis not present

## 2020-07-25 DIAGNOSIS — E1169 Type 2 diabetes mellitus with other specified complication: Secondary | ICD-10-CM | POA: Diagnosis not present

## 2020-07-27 DIAGNOSIS — Z23 Encounter for immunization: Secondary | ICD-10-CM | POA: Diagnosis not present

## 2020-08-02 DIAGNOSIS — Z01818 Encounter for other preprocedural examination: Secondary | ICD-10-CM | POA: Diagnosis not present

## 2020-08-02 DIAGNOSIS — Z01810 Encounter for preprocedural cardiovascular examination: Secondary | ICD-10-CM | POA: Diagnosis not present

## 2020-08-02 DIAGNOSIS — Z96651 Presence of right artificial knee joint: Secondary | ICD-10-CM | POA: Diagnosis not present

## 2020-08-02 DIAGNOSIS — L859 Epidermal thickening, unspecified: Secondary | ICD-10-CM | POA: Diagnosis not present

## 2020-08-02 DIAGNOSIS — E785 Hyperlipidemia, unspecified: Secondary | ICD-10-CM | POA: Diagnosis not present

## 2020-08-02 DIAGNOSIS — R079 Chest pain, unspecified: Secondary | ICD-10-CM | POA: Diagnosis not present

## 2020-08-02 DIAGNOSIS — I10 Essential (primary) hypertension: Secondary | ICD-10-CM | POA: Diagnosis not present

## 2020-08-02 DIAGNOSIS — E139 Other specified diabetes mellitus without complications: Secondary | ICD-10-CM | POA: Diagnosis not present

## 2020-08-03 ENCOUNTER — Telehealth: Payer: Self-pay | Admitting: Radiology

## 2020-08-03 NOTE — Telephone Encounter (Signed)
Letter received for clearance from Dr Pleas Koch. Patient moderate risk for surgery neg stress test on 08/02/20  Ok to schedule for total knee replacement ?  Letter on your key board

## 2020-08-05 NOTE — Telephone Encounter (Signed)
Left message with wife for him to call me back

## 2020-08-08 ENCOUNTER — Telehealth: Payer: Self-pay | Admitting: Orthopedic Surgery

## 2020-08-08 NOTE — Telephone Encounter (Signed)
Patient is returning Amy call.  Please call him back

## 2020-08-08 NOTE — Telephone Encounter (Signed)
Left message for him to call me back.  

## 2020-08-08 NOTE — Telephone Encounter (Signed)
Patient has several questions about his surgery if you could please call him back at [phone]  Thanks,

## 2020-08-09 NOTE — Telephone Encounter (Signed)
I called again to answer questions. He has asked for arrival times and I told him he will be advised when he goes for his pre op but likley will be early am, about 630 but to go with what they tell him not what I tell him.

## 2020-08-10 ENCOUNTER — Other Ambulatory Visit: Payer: Self-pay | Admitting: Orthopedic Surgery

## 2020-08-10 DIAGNOSIS — M1711 Unilateral primary osteoarthritis, right knee: Secondary | ICD-10-CM

## 2020-08-10 NOTE — Patient Instructions (Signed)
Robert Bishop  08/10/2020     @PREFPERIOPPHARMACY @   Your procedure is scheduled on 08/16/2020.   Report to [facility] at  [phone]  A.M.  Call this number if you have problems the morning of surgery:  [phone]   Remember:  Do not eat or drink after midnight.      Take these medicines the morning of surgery with A SIP OF WATER      amlodipine.  DO NOT take any medications for diabetes the morning of you procedure.     Please brush your teeth.  Do not wear jewelry, make-up or nail polish.  Do not wear lotions, powders, or perfumes, or deodorant.  Do not shave 48 hours prior to surgery.  Men may shave face and neck.  Do not bring valuables to the hospital.  [facility] is not responsible for any belongings or valuables.  Contacts, dentures or bridgework may not be worn into surgery.  Leave your suitcase in the car.  After surgery it may be brought to your room.  For patients admitted to the hospital, discharge time will be determined by your treatment team.  Patients discharged the day of surgery will not be allowed to drive home and must have someone with them for 24 hours.     Special instructions:  DO NOT smoke tobacco or vape for 24 hours before your procedure.  Please read over the following fact sheets that you were given. Pain Booklet, Coughing and Deep Breathing, Blood Transfusion Information, Total Joint Packet, Surgical Site Infection Prevention, Anesthesia Post-op Instructions, and Care and Recovery After Surgery      Total Knee Replacement, Care After This sheet gives you information about how to care for yourself after your procedure. Your health care provider may also give you more specific instructions. If you have problems or questions, contact your health careprovider. What can I expect after the procedure? After the procedure, it is common to have: Redness, pain, and swelling at the incision area. Stiffness. Discomfort. A small amount  of blood or clear fluid coming from your incision. Follow these instructions at home: Medicines Take over-the-counter and prescription medicines only as told by your health care provider. If you were prescribed a blood thinner (anticoagulant), take it as told by your health care provider. Ask your health care provider if the medicine prescribed to you: Requires you to avoid driving or using machinery. Can cause constipation. You may need to take these actions to prevent or treat constipation: Drink enough fluid to keep your urine pale yellow. Take over-the-counter or prescription medicines. Eat foods that are high in fiber, such as beans, whole grains, and fresh fruits and vegetables. Limit foods that are high in fat and processed sugars, such as fried or sweet foods. Incision care  Follow instructions from your health care provider about how to take care of your incision. Make sure you: Wash your hands with soap and water for at least 20 seconds before and after you change your bandage (dressing). If soap and water are not available, use hand sanitizer. Change your dressing as told by your health care provider. Leave stitches (sutures), staples, skin glue, or adhesive strips in place. These skin closures may need to stay in place for 2 weeks or longer. If adhesive strip edges start to loosen and curl up, you may trim the loose edges. Do not remove adhesive strips completely unless your health care provider tells you to do that.  Do not take baths, swim, or use a hot tub until your health care provider approves. Check your incision area every day for signs of infection. Check for: More redness, swelling, or pain. More fluid or blood. Warmth. Pus or a bad smell.  Activity Rest as told by your health care provider. Avoid sitting for a long time without moving. Get up to take short walks every 1-2 hours. This is important to improve blood flow and breathing. Ask for help if you feel weak or  unsteady. Follow instructions from your health care provider about using a walker, crutches, or a cane. You may use your legs to support (bear) your body weight as told by your health care provider. Follow instructions about how much weight you may safely support on your affected leg (weight-bearing restrictions). A physical therapist may show you how to get out of a bed and chair and how to go up and down stairs. You will first do this with a walker, crutches, or a cane and then without any of these devices. Once you are able to walk without a limp, you may stop using a walker, crutches, or a cane. Do exercises as told by your health care provider or physical therapist. Avoid high-impact activities, including running, jumping rope, and doing jumping jacks. Do not play contact sports until your health care provider approves. Return to your normal activities as told by your health care provider. Ask your health care provider what activities are safe for you. Managing pain, stiffness, and swelling  If directed, put ice on your knee. To do this: Put ice in a plastic bag or use the icing device (cold flow pad) that you were given. Follow instructions from your health care provider about how to use the icing device. Place a towel between your skin and the bag or between your skin and the icing device. Leave the ice on for 20 minutes, 2-3 times a day. Remove the ice if your skin turns bright red. This is very important. If you cannot feel pain, heat, or cold, you have a greater risk of damage to the area. Move your toes often to reduce stiffness and swelling. Raise (elevate) your leg above the level of your heart while you are sitting or lying down. Use several pillows to keep your leg straight. Do not put a pillow just under the knee. If the knee is bent for a long time, this may lead to stiffness. Wear elastic knee support as told by your health care provider.  Safety  To help prevent falls, keep  floors clear of objects you may trip over. Place items that you may need within easy reach. Wear an apron or tool belt with pockets for carrying objects. This leaves your hands free to help with your balance. Ask your health care provider when it is safe to drive.  General instructions Wear compression stockings as told by your health care provider. These stockings help to prevent blood clots and reduce swelling in your legs. Continue with breathing exercises. This helps prevent lung infection. Do not use any products that contain nicotine or tobacco. These products include cigarettes, chewing tobacco, and vaping devices, such as e-cigarettes. These can delay healing after surgery. If you need help quitting, ask your health care provider. Tell your health care provider if you plan to have dental work. Also: Tell your dentist about your joint replacement. Ask your health care provider if there are any special instructions you need to follow before having dental  care and routine cleanings. Keep all follow-up visits. This is important. Contact a health care provider if: You have a fever or chills. You have a cough or feel short of breath. Your medicine is not controlling your pain. You have any of these signs of infection: More redness, swelling, or pain around your incision. More fluid or blood coming from your incision. Warmth coming from your incision. Pus or a bad smell coming from your incision. You fall. Get help right away if: You have severe pain. You have trouble breathing. You have chest pain. You have redness, swelling, pain, or warmth in your calf or leg. Your incision breaks open after sutures or staples are removed. These symptoms may represent a serious problem that is an emergency. Do not wait to see if the symptoms will go away. Get medical help right away. Call your local emergency services (911 in the [place] Do not drive yourself to the hospital. Summary After the  procedure, it is common to have pain and swelling at the incision area, a small amount of blood or fluid coming from your incision, and stiffness. Follow instructions from your health care provider about how to take care of your incision. Use crutches, a walker, or a cane as told by your health care provider. This information is not intended to replace advice given to you by your health care provider. Make sure you discuss any questions you have with your healthcare provider. Document Revised: 08/04/2019 Document Reviewed: 08/04/2019 Elsevier Patient Education  [place] Anesthesia, Adult, Care After This sheet gives you information about how to care for yourself after your procedure. Your health care provider may also give you more specific instructions. If you have problems or questions, contact your health careprovider. What can I expect after the procedure? After the procedure, the following side effects are common: Pain or discomfort at the IV site. Nausea. Vomiting. Sore throat. Trouble concentrating. Feeling cold or chills. Feeling weak or tired. Sleepiness and fatigue. Soreness and body aches. These side effects can affect parts of the body that were not involved in surgery. Follow these instructions at home: For the time period you were told by your health care provider:  Rest. Do not participate in activities where you could fall or become injured. Do not drive or use machinery. Do not drink alcohol. Do not take sleeping pills or medicines that cause drowsiness. Do not make important decisions or sign legal documents. Do not take care of children on your own.  Eating and drinking Follow any instructions from your health care provider about eating or drinking restrictions. When you feel hungry, start by eating small amounts of foods that are soft and easy to digest (bland), such as toast. Gradually return to your regular diet. Drink enough fluid to keep  your urine pale yellow. If you vomit, rehydrate by drinking water, juice, or clear broth. General instructions If you have sleep apnea, surgery and certain medicines can increase your risk for breathing problems. Follow instructions from your health care provider about wearing your sleep device: Anytime you are sleeping, including during daytime naps. While taking prescription pain medicines, sleeping medicines, or medicines that make you drowsy. Have a responsible adult stay with you for the time you are told. It is important to have someone help care for you until you are awake and alert. Return to your normal activities as told by your health care provider. Ask your health care provider what activities are safe for you. Take over-the-counter  and prescription medicines only as told by your health care provider. If you smoke, do not smoke without supervision. Keep all follow-up visits as told by your health care provider. This is important. Contact a health care provider if: You have nausea or vomiting that does not get better with medicine. You cannot eat or drink without vomiting. You have pain that does not get better with medicine. You are unable to pass urine. You develop a skin rash. You have a fever. You have redness around your IV site that gets worse. Get help right away if: You have difficulty breathing. You have chest pain. You have blood in your urine or stool, or you vomit blood. Summary After the procedure, it is common to have a sore throat or nausea. It is also common to feel tired. Have a responsible adult stay with you for the time you are told. It is important to have someone help care for you until you are awake and alert. When you feel hungry, start by eating small amounts of foods that are soft and easy to digest (bland), such as toast. Gradually return to your regular diet. Drink enough fluid to keep your urine pale yellow. Return to your normal activities as told  by your health care provider. Ask your health care provider what activities are safe for you. This information is not intended to replace advice given to you by your health care provider. Make sure you discuss any questions you have with your healthcare provider. Document Revised: 10/29/2019 Document Reviewed: 05/28/2019 Elsevier Patient Education  2022 [employer]. How to Use Chlorhexidine for Bathing Chlorhexidine gluconate (CHG) is a germ-killing (antiseptic) solution that is used to clean the skin. It can get rid of the bacteria that normally live on the skin and can keep them away for about 24 hours. To clean your skin with CHG, you may be given: A CHG solution to use in the shower or as part of a sponge bath. A prepackaged cloth that contains CHG. Cleaning your skin with CHG may help lower the risk for infection: While you are staying in the intensive care unit of the hospital. If you have a vascular access, such as a central line, to provide short-term or long-term access to your veins. If you have a catheter to drain urine from your bladder. If you are on a ventilator. A ventilator is a machine that helps you breathe by moving air in and out of your lungs. After surgery. What are the risks? Risks of using CHG include: A skin reaction. Hearing loss, if CHG gets in your ears. Eye injury, if CHG gets in your eyes and is not rinsed out. The CHG product catching fire. Make sure that you avoid smoking and flames after applying CHG to your skin. Do not use CHG: If you have a chlorhexidine allergy or have previously reacted to chlorhexidine. On babies younger than 87 months of age. How to use CHG solution Use CHG only as told by your health care provider, and follow the instructions on the label. Use the full amount of CHG as directed. Usually, this is one bottle. During a shower Follow these steps when using CHG solution during a shower (unless your health care provider gives you  different instructions): Start the shower. Use your normal soap and shampoo to wash your face and hair. Turn off the shower or move out of the shower stream. Pour the CHG onto a clean washcloth. Do not use any type of brush or  rough-edged sponge. Starting at your neck, lather your body down to your toes. Make sure you follow these instructions: If you will be having surgery, pay special attention to the part of your body where you will be having surgery. Scrub this area for at least 1 minute. Do not use CHG on your head or face. If the solution gets into your ears or eyes, rinse them well with water. Avoid your genital area. Avoid any areas of skin that have broken skin, cuts, or scrapes. Scrub your back and under your arms. Make sure to wash skin folds. Let the lather sit on your skin for 1-2 minutes or as long as told by your health care provider. Thoroughly rinse your entire body in the shower. Make sure that all body creases and crevices are rinsed well. Dry off with a clean towel. Do not put any substances on your body afterward--such as powder, lotion, or perfume--unless you are told to do so by your health care provider. Only use lotions that are recommended by the manufacturer. Put on clean clothes or pajamas. If it is the night before your surgery, sleep in clean sheets.  During a sponge bath Follow these steps when using CHG solution during a sponge bath (unless your health care provider gives you different instructions): Use your normal soap and shampoo to wash your face and hair. Pour the CHG onto a clean washcloth. Starting at your neck, lather your body down to your toes. Make sure you follow these instructions: If you will be having surgery, pay special attention to the part of your body where you will be having surgery. Scrub this area for at least 1 minute. Do not use CHG on your head or face. If the solution gets into your ears or eyes, rinse them well with water. Avoid your  genital area. Avoid any areas of skin that have broken skin, cuts, or scrapes. Scrub your back and under your arms. Make sure to wash skin folds. Let the lather sit on your skin for 1-2 minutes or as long as told by your health care provider. Using a different clean, wet washcloth, thoroughly rinse your entire body. Make sure that all body creases and crevices are rinsed well. Dry off with a clean towel. Do not put any substances on your body afterward--such as powder, lotion, or perfume--unless you are told to do so by your health care provider. Only use lotions that are recommended by the manufacturer. Put on clean clothes or pajamas. If it is the night before your surgery, sleep in clean sheets. How to use CHG prepackaged cloths Only use CHG cloths as told by your health care provider, and follow the instructions on the label. Use the CHG cloth on clean, dry skin. Do not use the CHG cloth on your head or face unless your health care provider tells you to. When washing with the CHG cloth: Avoid your genital area. Avoid any areas of skin that have broken skin, cuts, or scrapes. Before surgery Follow these steps when using a CHG cloth to clean before surgery (unless your health care provider gives you different instructions): Using the CHG cloth, vigorously scrub the part of your body where you will be having surgery. Scrub using a back-and-forth motion for 3 minutes. The area on your body should be completely wet with CHG when you are done scrubbing. Do not rinse. Discard the cloth and let the area air-dry. Do not put any substances on the area afterward, such as powder,  lotion, or perfume. Put on clean clothes or pajamas. If it is the night before your surgery, sleep in clean sheets.  For general bathing Follow these steps when using CHG cloths for general bathing (unless your health care provider gives you different instructions). Use a separate CHG cloth for each area of your body. Make  sure you wash between any folds of skin and between your fingers and toes. Wash your body in the following order, switching to a new cloth after each step: The front of your neck, shoulders, and chest. Both of your arms, under your arms, and your hands. Your stomach and groin area, avoiding the genitals. Your right leg and foot. Your left leg and foot. The back of your neck, your back, and your buttocks. Do not rinse. Discard the cloth and let the area air-dry. Do not put any substances on your body afterward--such as powder, lotion, or perfume--unless you are told to do so by your health care provider. Only use lotions that are recommended by the manufacturer. Put on clean clothes or pajamas. Contact a health care provider if: Your skin gets irritated after scrubbing. You have questions about using your solution or cloth. Get help right away if: Your eyes become very red or swollen. Your eyes itch badly. Your skin itches badly and is red or swollen. Your hearing changes. You have trouble seeing. You have swelling or tingling in your mouth or throat. You have trouble breathing. You swallow any chlorhexidine. Summary Chlorhexidine gluconate (CHG) is a germ-killing (antiseptic) solution that is used to clean the skin. Cleaning your skin with CHG may help to lower your risk for infection. You may be given CHG to use for bathing. It may be in a bottle or in a prepackaged cloth to use on your skin. Carefully follow your health care provider's instructions and the instructions on the product label. Do not use CHG if you have a chlorhexidine allergy. Contact your health care provider if your skin gets irritated after scrubbing. This information is not intended to replace advice given to you by your health care provider. Make sure you discuss any questions you have with your healthcare provider. Document Revised: 06/26/2019 Document Reviewed: 07/31/2019 Elsevier Patient Education  [employer].

## 2020-08-12 ENCOUNTER — Encounter (HOSPITAL_COMMUNITY)
Admission: RE | Admit: 2020-08-12 | Discharge: 2020-08-12 | Disposition: A | Discharge status: Home / Self Care | Payer: Medicare Other | Source: Ambulatory Visit | Attending: Orthopedic Surgery | Admitting: Orthopedic Surgery

## 2020-08-12 ENCOUNTER — Encounter (HOSPITAL_COMMUNITY): Payer: Self-pay

## 2020-08-12 ENCOUNTER — Other Ambulatory Visit: Payer: Self-pay

## 2020-08-12 ENCOUNTER — Other Ambulatory Visit (HOSPITAL_COMMUNITY)
Admission: RE | Admit: 2020-08-12 | Discharge: 2020-08-12 | Disposition: A | Discharge status: Home / Self Care | Payer: Medicare Other | Source: Ambulatory Visit | Attending: Orthopedic Surgery | Admitting: Orthopedic Surgery

## 2020-08-12 DIAGNOSIS — Z20822 Contact with and (suspected) exposure to covid-19: Secondary | ICD-10-CM | POA: Diagnosis not present

## 2020-08-12 DIAGNOSIS — Z01818 Encounter for other preprocedural examination: Secondary | ICD-10-CM | POA: Insufficient documentation

## 2020-08-12 LAB — CBC WITH DIFFERENTIAL/PLATELET
Abs Immature Granulocytes: 0.02 10*3/uL (ref 0.00–0.07)
Basophils Absolute: 0 10*3/uL (ref 0.0–0.1)
Basophils Relative: 1 %
Eosinophils Absolute: 0.1 10*3/uL (ref 0.0–0.5)
Eosinophils Relative: 2 %
HCT: 46.6 % (ref 39.0–52.0)
Hemoglobin: 14.4 g/dL (ref 13.0–17.0)
Immature Granulocytes: 0 %
Lymphocytes Relative: 38 %
Lymphs Abs: 1.7 10*3/uL (ref 0.7–4.0)
MCH: 27.9 pg (ref 26.0–34.0)
MCHC: 30.9 g/dL (ref 30.0–36.0)
MCV: 90.3 fL (ref 80.0–100.0)
Monocytes Absolute: 0.5 10*3/uL (ref 0.1–1.0)
Monocytes Relative: 11 %
Neutro Abs: 2.2 10*3/uL (ref 1.7–7.7)
Neutrophils Relative %: 48 %
Platelets: 167 10*3/uL (ref 150–400)
RBC: 5.16 MIL/uL (ref 4.22–5.81)
RDW: 12.8 % (ref 11.5–15.5)
WBC: 4.5 10*3/uL (ref 4.0–10.5)
nRBC: 0 % (ref 0.0–0.2)

## 2020-08-12 LAB — BASIC METABOLIC PANEL
Anion gap: 8 (ref 5–15)
BUN: 13 mg/dL (ref 8–23)
CO2: 22 mmol/L (ref 22–32)
Calcium: 9.1 mg/dL (ref 8.9–10.3)
Chloride: 107 mmol/L (ref 98–111)
Creatinine, Ser: 1.38 mg/dL — ABNORMAL HIGH (ref 0.61–1.24)
GFR, Estimated: 53 mL/min — ABNORMAL LOW (ref >60)
Glucose, Bld: 92 mg/dL (ref 70–99)
Potassium: 4.1 mmol/L (ref 3.5–5.1)
Sodium: 137 mmol/L (ref 135–145)

## 2020-08-12 LAB — TYPE AND SCREEN
ABO/RH(D): B POS
Antibody Screen: NEGATIVE

## 2020-08-13 LAB — SARS CORONAVIRUS 2 (TAT 6-24 HRS): SARS Coronavirus 2: NEGATIVE

## 2020-08-13 LAB — HEMOGLOBIN A1C
Hgb A1c MFr Bld: 6.6 % — ABNORMAL HIGH (ref 4.8–5.6)
Mean Plasma Glucose: 143 mg/dL

## 2020-08-15 NOTE — H&P (Signed)
Admission history and physical for knee replacement surgery   NEW PROBLEM//OFFICE VISIT         Chief Complaint  Patient presents with   Knee Pain      Right for 2-3 years getting worse       75 year old male progressive knee pain over the last 3 years worsening in the last year.  Complains of medial knee pain loss of function decreased range of motion   Patient says this is interfering with his activities and "I need to do something"     Review of Systems Respiratory: Negative for shortness of breath.   Cardiovascular: Negative for chest pain. Gastrointestinal: Negative.   All other systems reviewed and are negative.           Past Medical History:  Diagnosis Date   Anemia     Diabetes mellitus without complication (HCC)     GERD (gastroesophageal reflux disease)      pt states no problem since hernia repair   H/O hiatal hernia     Hematuria     History of blood transfusion yrs ago   Hypertension     Lesion of bladder     Wears glasses             Past Surgical History:  Procedure Laterality Date   CYSTOSCOPY N/A 08/10/2014    Procedure: CYSTOSCOPY FLEXIBLE AND REMOVAL OF FOREIGN BODY ( Vascular Clip);  Surgeon: Irine Seal, MD;  Location: [facility];  Service: Urology;  Laterality: N/A;   CYSTOSCOPY WITH BIOPSY N/A 06/12/2012    Procedure: CYSTOSCOPY BLADDER BIOPSY WITH FULGURATION;  Surgeon: Malka So, MD;  Location: [facility];  Service: Urology;  Laterality: N/A;   HERNIA REPAIR        hiatal   PENILE PROSTHESIS IMPLANT N/A 08/18/2013    Procedure: INSERTION OF COLOPLAST TITAN PENILE PROSTHESIS INFRAPUBIC APPROACH;  Surgeon: Malka So, MD;  Location: [facility];  Service: Urology;  Laterality: N/A;   ROBOT ASSISTED LAPAROSCOPIC RADICAL PROSTATECTOMY N/A 08/06/2012    Procedure: ROBOTIC ASSISTED LAPAROSCOPIC RADICAL PROSTATECTOMY WITH NODE DISSECTION AND LEFT NERVE SPARE ONLY;  Surgeon: Malka So, MD;  Location: [facility];  Service: Urology;   Laterality: N/A;      History reviewed. No pertinent family history. Social History         Tobacco Use   Smoking status: Former Smoker      Packs/day: 0.50      Years: 25.00      Pack years: 12.50      Types: Cigarettes      Quit date: 06/12/1990      Years since quitting: 30.1   Smokeless tobacco: Never Used  Substance Use Topics   Alcohol use: No      Comment: none x 20 yrs   Drug use: No          Allergies  Allergen Reactions   Oxycodone-Acetaminophen Nausea And Vomiting      Active Medications      Current Meds  Medication Sig   amLODipine (NORVASC) 10 MG tablet Take 1 tablet by mouth daily.   aspirin EC 81 MG tablet Take 81 mg by mouth daily.   atorvastatin (LIPITOR) 10 MG tablet Take 1 tablet by mouth daily.   meloxicam (MOBIC) 15 MG tablet Take 1 tablet by mouth daily as needed.   metFORMIN (GLUMETZA) 500 MG (MOD) 24 hr tablet Take 500 mg by mouth daily with breakfast.  BP (!) 148/82   Pulse (!) 55   Ht 5\' 8"  (1.727 m)   Wt 225 lb (102.1 kg)   BMI 34.21 kg/m    Physical Exam   General appearance: Well-developed well-nourished no gross deformities  Cardiovascular normal pulse and perfusion normal color without edema  Neurologically o sensation loss or deficits or pathologic reflexes   Psychological: Awake alert and oriented x3 mood and affect normal   Skin no lacerations or ulcerations no nodularity no palpable masses, no erythema or nodularity   Musculoskeletal:    Varus deformity right knee medial joint line tenderness fortunately he has retained his motion at 220 degrees minimal flexion contracture there is no pseudolaxity or laxity in the anterior posterior plane of the sagittal or coronal plane   Neurovascular exam is intact no peripheral edema         MEDICAL DECISION MAKING   A.  Osteoarthritis right knee   B. DATA ANALYSED:   IMAGING: Interpretation of images: He had an MRI of his knee that shows 3 compartment arthritis  and medial lateral meniscal tears however his plain film taken in the office today shows severe arthritis of the medial compartment 6 degree varus deformity 3 compartment disease     Orders: Right total knee Outside records reviewed: None  C. MANAGEMENT   The procedure has been fully reviewed with the patient; The risks and benefits of surgery have been discussed and explained and understood. Alternative treatment has also been reviewed, questions were encouraged and answered. The postoperative plan is also been reviewed.   He has good home support   Proceed with right total knee   No orders of the defined types were placed in this encounter.         Arther Abbott, MD

## 2020-08-16 ENCOUNTER — Ambulatory Visit (HOSPITAL_COMMUNITY): Payer: Medicare Other | Admitting: Anesthesiology

## 2020-08-16 ENCOUNTER — Other Ambulatory Visit: Payer: Self-pay

## 2020-08-16 ENCOUNTER — Ambulatory Visit (HOSPITAL_COMMUNITY): Payer: Medicare Other

## 2020-08-16 ENCOUNTER — Encounter (HOSPITAL_COMMUNITY)
Admission: RE | Disposition: A | Discharge status: Home / Self Care | Payer: Self-pay | Source: Home / Self Care | Attending: Orthopedic Surgery

## 2020-08-16 ENCOUNTER — Observation Stay (HOSPITAL_COMMUNITY)
Admission: RE | Admit: 2020-08-16 | Discharge: 2020-08-17 | Disposition: A | Discharge status: Home / Self Care | Payer: Medicare Other | Attending: Orthopedic Surgery | Admitting: Orthopedic Surgery

## 2020-08-16 DIAGNOSIS — E119 Type 2 diabetes mellitus without complications: Secondary | ICD-10-CM | POA: Diagnosis not present

## 2020-08-16 DIAGNOSIS — Z7982 Long term (current) use of aspirin: Secondary | ICD-10-CM | POA: Diagnosis not present

## 2020-08-16 DIAGNOSIS — I10 Essential (primary) hypertension: Secondary | ICD-10-CM | POA: Insufficient documentation

## 2020-08-16 DIAGNOSIS — M1711 Unilateral primary osteoarthritis, right knee: Secondary | ICD-10-CM | POA: Diagnosis not present

## 2020-08-16 DIAGNOSIS — Z96651 Presence of right artificial knee joint: Secondary | ICD-10-CM

## 2020-08-16 DIAGNOSIS — Z87891 Personal history of nicotine dependence: Secondary | ICD-10-CM | POA: Diagnosis not present

## 2020-08-16 DIAGNOSIS — Z471 Aftercare following joint replacement surgery: Secondary | ICD-10-CM | POA: Diagnosis not present

## 2020-08-16 DIAGNOSIS — Z7984 Long term (current) use of oral hypoglycemic drugs: Secondary | ICD-10-CM | POA: Diagnosis not present

## 2020-08-16 DIAGNOSIS — Z79899 Other long term (current) drug therapy: Secondary | ICD-10-CM | POA: Diagnosis not present

## 2020-08-16 DIAGNOSIS — G8918 Other acute postprocedural pain: Secondary | ICD-10-CM | POA: Diagnosis not present

## 2020-08-16 HISTORY — PX: TOTAL KNEE ARTHROPLASTY: SHX125

## 2020-08-16 LAB — GLUCOSE, CAPILLARY
Glucose-Capillary: 100 mg/dL — ABNORMAL HIGH (ref 70–99)
Glucose-Capillary: 121 mg/dL — ABNORMAL HIGH (ref 70–99)
Glucose-Capillary: 113 mg/dL — ABNORMAL HIGH (ref 70–99)
Glucose-Capillary: 181 mg/dL — ABNORMAL HIGH (ref 70–99)

## 2020-08-16 SURGERY — ARTHROPLASTY, KNEE, TOTAL
Anesthesia: Spinal | Site: Knee | Laterality: Right

## 2020-08-16 MED ORDER — ONDANSETRON HCL 4 MG PO TABS: 4.0000 mg | ORAL_TABLET | Freq: Four times a day (QID) | ORAL | Status: DC | PRN

## 2020-08-16 MED ORDER — MIDAZOLAM HCL 5 MG/5ML IJ SOLN
INTRAMUSCULAR | Status: DC | PRN
  Administered 2020-08-16: .5 mg via INTRAVENOUS
  Administered 2020-08-16: 1 mg via INTRAVENOUS
  Administered 2020-08-16: .5 mg via INTRAVENOUS

## 2020-08-16 MED ORDER — ONDANSETRON HCL 4 MG/2ML IJ SOLN
INTRAMUSCULAR | Status: DC | PRN
  Administered 2020-08-16: 4 mg via INTRAVENOUS

## 2020-08-16 MED ORDER — LIDOCAINE HCL (PF) 1 % IJ SOLN
INTRAMUSCULAR | Status: DC | PRN
  Administered 2020-08-16: 3 mL

## 2020-08-16 MED ORDER — METHOCARBAMOL 500 MG PO TABS: 500.0000 mg | ORAL_TABLET | Freq: Four times a day (QID) | ORAL | Status: DC | PRN

## 2020-08-16 MED ORDER — CHLORHEXIDINE GLUCONATE CLOTH 2 % EX PADS
6.0000 | MEDICATED_PAD | Freq: Every day | CUTANEOUS | Status: DC
  Administered 2020-08-17: 6 via TOPICAL

## 2020-08-16 MED ORDER — LACTATED RINGERS IV SOLN: INTRAVENOUS | Status: DC

## 2020-08-16 MED ORDER — FENTANYL CITRATE (PF) 100 MCG/2ML IJ SOLN
INTRAMUSCULAR | Status: DC | PRN
  Administered 2020-08-16 (×2): 25 ug via INTRAVENOUS

## 2020-08-16 MED ORDER — METHOCARBAMOL 1000 MG/10ML IJ SOLN
500.0000 mg | Freq: Once | INTRAVENOUS | Status: AC
  Administered 2020-08-16: 500 mg via INTRAVENOUS
  Filled 2020-08-16: qty 5

## 2020-08-16 MED ORDER — FLEET ENEMA 7-19 GM/118ML RE ENEM: 1.0000 | ENEMA | Freq: Once | RECTAL | Status: DC | PRN

## 2020-08-16 MED ORDER — PROPOFOL 10 MG/ML IV BOLUS
INTRAVENOUS | Status: AC
  Filled 2020-08-16: qty 40

## 2020-08-16 MED ORDER — POVIDONE-IODINE 10 % EX SWAB: 2.0000 "application " | Freq: Once | CUTANEOUS | Status: DC

## 2020-08-16 MED ORDER — ALUM & MAG HYDROXIDE-SIMETH 200-200-20 MG/5ML PO SUSP: 30.0000 mL | ORAL | Status: DC | PRN

## 2020-08-16 MED ORDER — HYDROMORPHONE HCL 1 MG/ML IJ SOLN: 0.2500 mg | INTRAMUSCULAR | Status: DC | PRN

## 2020-08-16 MED ORDER — TRANEXAMIC ACID-NACL 1000-0.7 MG/100ML-% IV SOLN
1000.0000 mg | Freq: Once | INTRAVENOUS | Status: AC
  Administered 2020-08-16: 1000 mg via INTRAVENOUS
  Filled 2020-08-16: qty 100

## 2020-08-16 MED ORDER — FENTANYL CITRATE (PF) 250 MCG/5ML IJ SOLN
INTRAMUSCULAR | Status: AC
  Filled 2020-08-16: qty 5

## 2020-08-16 MED ORDER — ONDANSETRON HCL 4 MG/2ML IJ SOLN: 4.0000 mg | Freq: Four times a day (QID) | INTRAMUSCULAR | Status: DC | PRN

## 2020-08-16 MED ORDER — CEFAZOLIN SODIUM-DEXTROSE 2-4 GM/100ML-% IV SOLN
2.0000 g | INTRAVENOUS | Status: AC
  Administered 2020-08-16: 2 g via INTRAVENOUS

## 2020-08-16 MED ORDER — DIPHENHYDRAMINE HCL 12.5 MG/5ML PO ELIX: 12.5000 mg | ORAL_SOLUTION | ORAL | Status: DC | PRN

## 2020-08-16 MED ORDER — MORPHINE SULFATE (PF) 2 MG/ML IV SOLN
0.5000 mg | INTRAVENOUS | Status: DC | PRN
Start: 2020-08-16 — End: 2020-08-17

## 2020-08-16 MED ORDER — TRANEXAMIC ACID-NACL 1000-0.7 MG/100ML-% IV SOLN
1000.0000 mg | INTRAVENOUS | Status: AC
  Administered 2020-08-16: 1000 mg via INTRAVENOUS

## 2020-08-16 MED ORDER — SODIUM CHLORIDE 0.9 % IR SOLN
Status: DC | PRN
  Administered 2020-08-16: 3000 mL

## 2020-08-16 MED ORDER — MIDAZOLAM HCL 2 MG/2ML IJ SOLN
2.0000 mg | Freq: Once | INTRAMUSCULAR | Status: AC
  Administered 2020-08-16: 2 mg via INTRAVENOUS
  Filled 2020-08-16: qty 2

## 2020-08-16 MED ORDER — HYDROCODONE-ACETAMINOPHEN 7.5-325 MG PO TABS: 1.0000 | ORAL_TABLET | ORAL | Status: DC | PRN

## 2020-08-16 MED ORDER — EPHEDRINE 5 MG/ML INJ
INTRAVENOUS | Status: AC
  Filled 2020-08-16: qty 10

## 2020-08-16 MED ORDER — ASPIRIN 81 MG PO CHEW
81.0000 mg | CHEWABLE_TABLET | Freq: Two times a day (BID) | ORAL | Status: DC
  Administered 2020-08-17: 81 mg via ORAL
  Filled 2020-08-16: qty 1

## 2020-08-16 MED ORDER — ACETAMINOPHEN 500 MG PO TABS
500.0000 mg | ORAL_TABLET | Freq: Four times a day (QID) | ORAL | Status: DC
  Administered 2020-08-16 – 2020-08-17 (×3): 500 mg via ORAL
  Filled 2020-08-16 (×3): qty 1

## 2020-08-16 MED ORDER — BUPIVACAINE-MELOXICAM ER 200-6 MG/7ML IJ SOLN
INTRAMUSCULAR | Status: DC | PRN
  Administered 2020-08-16: 10 mL

## 2020-08-16 MED ORDER — METOCLOPRAMIDE HCL 10 MG PO TABS: 5.0000 mg | ORAL_TABLET | Freq: Three times a day (TID) | ORAL | Status: DC | PRN

## 2020-08-16 MED ORDER — DEXAMETHASONE SODIUM PHOSPHATE 10 MG/ML IJ SOLN
INTRAMUSCULAR | Status: AC
  Filled 2020-08-16: qty 1

## 2020-08-16 MED ORDER — SODIUM CHLORIDE 0.9 % IV SOLN: INTRAVENOUS | Status: AC

## 2020-08-16 MED ORDER — PROPOFOL 500 MG/50ML IV EMUL
INTRAVENOUS | Status: DC | PRN
  Administered 2020-08-16: 50 ug/kg/min via INTRAVENOUS

## 2020-08-16 MED ORDER — CELECOXIB 100 MG PO CAPS
200.0000 mg | ORAL_CAPSULE | Freq: Two times a day (BID) | ORAL | Status: DC
  Administered 2020-08-16 – 2020-08-17 (×2): 200 mg via ORAL
  Filled 2020-08-16 (×2): qty 2

## 2020-08-16 MED ORDER — LIDOCAINE HCL (PF) 2 % IJ SOLN
INTRAMUSCULAR | Status: AC
  Filled 2020-08-16: qty 5

## 2020-08-16 MED ORDER — DEXAMETHASONE SODIUM PHOSPHATE 4 MG/ML IJ SOLN
INTRAMUSCULAR | Status: AC
  Filled 2020-08-16: qty 2

## 2020-08-16 MED ORDER — DEXAMETHASONE SODIUM PHOSPHATE 4 MG/ML IJ SOLN
INTRAMUSCULAR | Status: DC | PRN
  Administered 2020-08-16: 4 mg via INTRAVENOUS

## 2020-08-16 MED ORDER — BUPIVACAINE-EPINEPHRINE (PF) 0.25% -1:200000 IJ SOLN
INTRAMUSCULAR | Status: DC | PRN
  Administered 2020-08-16: 28 mL via PERINEURAL

## 2020-08-16 MED ORDER — INSULIN ASPART 100 UNIT/ML IJ SOLN
0.0000 [IU] | Freq: Three times a day (TID) | INTRAMUSCULAR | Status: DC
  Administered 2020-08-17: 2 [IU] via SUBCUTANEOUS

## 2020-08-16 MED ORDER — METFORMIN HCL ER 500 MG PO TB24
500.0000 mg | ORAL_TABLET | Freq: Every day | ORAL | Status: DC
  Administered 2020-08-17: 500 mg via ORAL
  Filled 2020-08-16: qty 1

## 2020-08-16 MED ORDER — MIDAZOLAM HCL 2 MG/2ML IJ SOLN
INTRAMUSCULAR | Status: AC
  Filled 2020-08-16: qty 2

## 2020-08-16 MED ORDER — METOCLOPRAMIDE HCL 5 MG/ML IJ SOLN
5.0000 mg | Freq: Three times a day (TID) | INTRAMUSCULAR | Status: DC | PRN
Start: 2020-08-16 — End: 2020-08-17

## 2020-08-16 MED ORDER — ORAL CARE MOUTH RINSE: 15.0000 mL | Freq: Once | OROMUCOSAL | Status: AC

## 2020-08-16 MED ORDER — EPHEDRINE SULFATE 50 MG/ML IJ SOLN
INTRAMUSCULAR | Status: DC | PRN
  Administered 2020-08-16: 5 mg via INTRAVENOUS

## 2020-08-16 MED ORDER — TRANEXAMIC ACID-NACL 1000-0.7 MG/100ML-% IV SOLN
INTRAVENOUS | Status: AC
  Filled 2020-08-16: qty 100

## 2020-08-16 MED ORDER — PHENOL 1.4 % MT LIQD: 1.0000 | OROMUCOSAL | Status: DC | PRN

## 2020-08-16 MED ORDER — AMLODIPINE BESYLATE 5 MG PO TABS
10.0000 mg | ORAL_TABLET | Freq: Every day | ORAL | Status: DC
  Administered 2020-08-17: 10 mg via ORAL
  Filled 2020-08-16: qty 2

## 2020-08-16 MED ORDER — HYDROCODONE-ACETAMINOPHEN 10-325 MG PO TABS
1.0000 | ORAL_TABLET | Freq: Once | ORAL | Status: AC
  Administered 2020-08-16: 1 via ORAL

## 2020-08-16 MED ORDER — PREGABALIN 50 MG PO CAPS
50.0000 mg | ORAL_CAPSULE | Freq: Once | ORAL | Status: AC
  Administered 2020-08-16: 50 mg via ORAL

## 2020-08-16 MED ORDER — ONDANSETRON HCL 4 MG/2ML IJ SOLN
4.0000 mg | Freq: Once | INTRAMUSCULAR | Status: AC
  Administered 2020-08-16: 4 mg via INTRAVENOUS
  Filled 2020-08-16: qty 2

## 2020-08-16 MED ORDER — CEFAZOLIN SODIUM-DEXTROSE 2-4 GM/100ML-% IV SOLN
2.0000 g | Freq: Four times a day (QID) | INTRAVENOUS | Status: AC
  Administered 2020-08-16 (×2): 2 g via INTRAVENOUS
  Filled 2020-08-16 (×2): qty 100

## 2020-08-16 MED ORDER — DOCUSATE SODIUM 100 MG PO CAPS
100.0000 mg | ORAL_CAPSULE | Freq: Two times a day (BID) | ORAL | Status: DC
  Administered 2020-08-16 – 2020-08-17 (×2): 100 mg via ORAL
  Filled 2020-08-16 (×2): qty 1

## 2020-08-16 MED ORDER — MENTHOL 3 MG MT LOZG: 1.0000 | LOZENGE | OROMUCOSAL | Status: DC | PRN

## 2020-08-16 MED ORDER — ONDANSETRON HCL 4 MG/2ML IJ SOLN
INTRAMUSCULAR | Status: AC
  Filled 2020-08-16: qty 2

## 2020-08-16 MED ORDER — VITAMIN D 25 MCG (1000 UNIT) PO TABS
1000.0000 [IU] | ORAL_TABLET | Freq: Every day | ORAL | Status: DC
  Administered 2020-08-17: 1000 [IU] via ORAL
  Filled 2020-08-16: qty 1

## 2020-08-16 MED ORDER — CHLORHEXIDINE GLUCONATE 0.12 % MT SOLN
15.0000 mL | Freq: Once | OROMUCOSAL | Status: AC
  Administered 2020-08-16: 15 mL via OROMUCOSAL

## 2020-08-16 MED ORDER — LIDOCAINE HCL (PF) 1 % IJ SOLN
INTRAMUSCULAR | Status: AC
  Filled 2020-08-16: qty 30

## 2020-08-16 MED ORDER — ATORVASTATIN CALCIUM 10 MG PO TABS
10.0000 mg | ORAL_TABLET | Freq: Every day | ORAL | Status: DC
  Administered 2020-08-17: 10 mg via ORAL
  Filled 2020-08-16: qty 1

## 2020-08-16 MED ORDER — SENNOSIDES-DOCUSATE SODIUM 8.6-50 MG PO TABS: 1.0000 | ORAL_TABLET | Freq: Every evening | ORAL | Status: DC | PRN

## 2020-08-16 MED ORDER — METHOCARBAMOL 1000 MG/10ML IJ SOLN
500.0000 mg | Freq: Four times a day (QID) | INTRAVENOUS | Status: DC | PRN
  Filled 2020-08-16: qty 5

## 2020-08-16 MED ORDER — BISACODYL 5 MG PO TBEC: 5.0000 mg | DELAYED_RELEASE_TABLET | Freq: Every day | ORAL | Status: DC | PRN

## 2020-08-16 MED ORDER — BUPIVACAINE IN DEXTROSE 0.75-8.25 % IT SOLN
INTRATHECAL | Status: DC | PRN
  Administered 2020-08-16: 15 mg via INTRATHECAL

## 2020-08-16 MED ORDER — 0.9 % SODIUM CHLORIDE (POUR BTL) OPTIME
TOPICAL | Status: DC | PRN
  Administered 2020-08-16: 1000 mL

## 2020-08-16 MED ORDER — ONDANSETRON HCL 4 MG/2ML IJ SOLN: 4.0000 mg | Freq: Once | INTRAMUSCULAR | Status: DC | PRN

## 2020-08-16 MED ORDER — BUPIVACAINE-EPINEPHRINE (PF) 0.25% -1:200000 IJ SOLN
INTRAMUSCULAR | Status: AC
  Filled 2020-08-16: qty 30

## 2020-08-16 MED ORDER — CELECOXIB 400 MG PO CAPS
400.0000 mg | ORAL_CAPSULE | Freq: Once | ORAL | Status: AC
  Administered 2020-08-16: 400 mg via ORAL
  Filled 2020-08-16: qty 1

## 2020-08-16 MED ORDER — HYDROCODONE-ACETAMINOPHEN 10-325 MG PO TABS
1.0000 | ORAL_TABLET | Freq: Once | ORAL | Status: AC
Start: 2020-08-16 — End: 2020-08-16
  Administered 2020-08-16: 1 via ORAL

## 2020-08-16 MED ORDER — DEXAMETHASONE SODIUM PHOSPHATE 4 MG/ML IJ SOLN
INTRAMUSCULAR | Status: DC | PRN
  Administered 2020-08-16: 8 mg via PERINEURAL

## 2020-08-16 MED ORDER — TRAMADOL HCL 50 MG PO TABS
50.0000 mg | ORAL_TABLET | Freq: Four times a day (QID) | ORAL | Status: DC
  Administered 2020-08-16 – 2020-08-17 (×4): 50 mg via ORAL
  Filled 2020-08-16 (×4): qty 1

## 2020-08-16 MED ORDER — CEFAZOLIN SODIUM-DEXTROSE 2-4 GM/100ML-% IV SOLN
INTRAVENOUS | Status: AC
  Filled 2020-08-16: qty 100

## 2020-08-16 SURGICAL SUPPLY — 64 items
ATTUNE MED DOME PAT 41 KNEE (Knees) ×2 IMPLANT
ATTUNE MED DOME PAT 41MM KNEE (Knees) ×1 IMPLANT
ATTUNE PS FEM RT SZ 8 CEM KNEE (Femur) ×3 IMPLANT
BANDAGE ESMARK 6X9 LF (GAUZE/BANDAGES/DRESSINGS) ×1 IMPLANT
BASEPLATE TIB CMT FB PCKT SZ 8 (Knees) ×3 IMPLANT
BIT DRILL 3.2X128 (BIT) IMPLANT
BIT DRILL 3.2X128MM (BIT)
BLADE HEX COATED 2.75 (ELECTRODE) ×3 IMPLANT
BLADE SAGITTAL 25.0X1.27X90 (BLADE) ×2 IMPLANT
BLADE SAGITTAL 25.0X1.27X90MM (BLADE) ×1
BLADE SAW SGTL 11.0X1.19X90.0M (BLADE) ×3 IMPLANT
BNDG ELASTIC 4X5.8 VLCR NS LF (GAUZE/BANDAGES/DRESSINGS) ×6 IMPLANT
BNDG ELASTIC 6X5.8 VLCR NS LF (GAUZE/BANDAGES/DRESSINGS) ×3 IMPLANT
BNDG ESMARK 6X9 LF (GAUZE/BANDAGES/DRESSINGS) ×3
CEMENT HV SMART SET (Cement) ×6 IMPLANT
CLOTH BEACON ORANGE TIMEOUT ST (SAFETY) ×3 IMPLANT
COOLER ICEMAN CLASSIC (MISCELLANEOUS) ×3 IMPLANT
COVER LIGHT HANDLE STERIS (MISCELLANEOUS) ×6 IMPLANT
COVER WAND RF STERILE (DRAPES) ×3 IMPLANT
CUFF TOURN SGL QUICK 34 (TOURNIQUET CUFF) ×2
CUFF TRNQT CYL 34X4.125X (TOURNIQUET CUFF) ×1 IMPLANT
DRAPE BACK TABLE (DRAPES) ×3 IMPLANT
DRAPE EXTREMITY T 121X128X90 (DISPOSABLE) ×3 IMPLANT
DRSG MEPILEX BORDER 4X12 (GAUZE/BANDAGES/DRESSINGS) ×3 IMPLANT
DURAPREP 26ML APPLICATOR (WOUND CARE) ×6 IMPLANT
ELECT REM PT RETURN 9FT ADLT (ELECTROSURGICAL) ×3
ELECTRODE REM PT RTRN 9FT ADLT (ELECTROSURGICAL) ×1 IMPLANT
GLOVE SKINSENSE NS SZ8.0 LF (GLOVE) ×4
GLOVE SKINSENSE STRL SZ8.0 LF (GLOVE) ×2 IMPLANT
GLOVE SS N UNI LF 8.5 STRL (GLOVE) ×3 IMPLANT
GLOVE SURG UNDER POLY LF SZ7 (GLOVE) ×6 IMPLANT
GOWN STRL REUS W/TWL LRG LVL3 (GOWN DISPOSABLE) ×9 IMPLANT
GOWN STRL REUS W/TWL XL LVL3 (GOWN DISPOSABLE) ×3 IMPLANT
HANDPIECE INTERPULSE COAX TIP (DISPOSABLE) ×2
HOOD W/PEELAWAY (MISCELLANEOUS) ×12 IMPLANT
INSERT TIBIA FIXED BEARING SZ8 (Insert) ×3 IMPLANT
INST SET MAJOR BONE (KITS) ×3 IMPLANT
IV NS IRRIG 3000ML ARTHROMATIC (IV SOLUTION) ×3 IMPLANT
KIT BLADEGUARD II DBL (SET/KITS/TRAYS/PACK) ×3 IMPLANT
KIT TURNOVER KIT A (KITS) ×3 IMPLANT
MANIFOLD NEPTUNE II (INSTRUMENTS) ×3 IMPLANT
MARKER SKIN DUAL TIP RULER LAB (MISCELLANEOUS) ×3 IMPLANT
NS IRRIG 1000ML POUR BTL (IV SOLUTION) ×3 IMPLANT
PACK TOTAL JOINT (CUSTOM PROCEDURE TRAY) ×3 IMPLANT
PAD ARMBOARD 7.5X6 YLW CONV (MISCELLANEOUS) ×3 IMPLANT
PAD COLD SHLDR WRAP-ON (PAD) ×3 IMPLANT
PENCIL SMOKE EVACUATOR (MISCELLANEOUS) ×3 IMPLANT
PILLOW KNEE EXTENSION 0 DEG (MISCELLANEOUS) ×3 IMPLANT
PIN/DRILL PACK ORTHO 1/8X3.0 (PIN) ×3 IMPLANT
SAW OSC TIP CART 19.5X105X1.3 (SAW) ×3 IMPLANT
SET BASIN LINEN APH (SET/KITS/TRAYS/PACK) ×3 IMPLANT
SET HNDPC FAN SPRY TIP SCT (DISPOSABLE) ×1 IMPLANT
SPONGE LAP 18X18 RF (DISPOSABLE) ×3 IMPLANT
STAPLER VISISTAT 35W (STAPLE) ×3 IMPLANT
SUT BRALON NAB BRD #1 30IN (SUTURE) ×6 IMPLANT
SUT MNCRL 0 VIOLET CTX 36 (SUTURE) ×1 IMPLANT
SUT MON AB 0 CT1 (SUTURE) ×3 IMPLANT
SUT MONOCRYL 0 CTX 36 (SUTURE) ×2
SYR BULB IRRIG 60ML STRL (SYRINGE) ×3 IMPLANT
TOWEL OR 17X26 4PK STRL BLUE (TOWEL DISPOSABLE) ×3 IMPLANT
TOWER CARTRIDGE SMART MIX (DISPOSABLE) ×3 IMPLANT
TRAY FOLEY MTR SLVR 16FR STAT (SET/KITS/TRAYS/PACK) ×3 IMPLANT
WATER STERILE IRR 1000ML POUR (IV SOLUTION) ×6 IMPLANT
YANKAUER SUCT 12FT TUBE ARGYLE (SUCTIONS) ×3 IMPLANT

## 2020-08-16 NOTE — Brief Op Note (Signed)
08/16/2020  1:27 PM  PATIENT:  Narda Rutherford  75 y.o. male  PRE-OPERATIVE DIAGNOSIS:  Right knee osteoarthritis  POST-OPERATIVE DIAGNOSIS:  Right knee osteoarthritis  PROCEDURE:  Procedure(s): RIGHT TOTAL KNEE ARTHROPLASTY (Right)  SURGEON:  Surgeon(s) and Role:    Carole Civil, MD - Primary  PHYSICIAN ASSISTANT:   ASSISTANTS: Marquita Palms and Debbie dallas   ANESTHESIA:   spinal and saphenous nerve block   EBL:  50 mL   BLOOD ADMINISTERED:none  DRAINS: none   LOCAL MEDICATIONS USED:  OTHER zinrelef  SPECIMEN:  No Specimen  DISPOSITION OF SPECIMEN:  N/A  COUNTS:  YES  TOURNIQUET:   Total Tourniquet Time Documented: Thigh (Right) - 104 minutes Total: Thigh (Right) - 104 minutes   DICTATION: .Viviann Spare Dictation  PLAN OF CARE: Admit for overnight observation  PATIENT DISPOSITION:  PACU - hemodynamically stable.   Delay start of Pharmacological VTE agent (>24hrs) due to surgical blood loss or risk of bleeding: yes

## 2020-08-16 NOTE — Anesthesia Procedure Notes (Addendum)
Date/Time: 08/16/2020 7:35 AM Performed by: Ollen Bowl, CRNA Pre-anesthesia Checklist: Patient identified, Emergency Drugs available, Suction available, Timeout performed and Patient being monitored Patient Re-evaluated:Patient Re-evaluated prior to induction Oxygen Delivery Method: Nasal Cannula

## 2020-08-16 NOTE — Anesthesia Preprocedure Evaluation (Signed)
Anesthesia Evaluation  Patient identified by MRN, date of birth, ID band Patient awake    Reviewed: Allergy & Precautions, NPO status , Patient's Chart, lab work & pertinent test results  History of Anesthesia Complications Negative for: history of anesthetic complications  Airway Mallampati: II  TM Distance: >3 FB Neck ROM: Full    Dental  (+) Dental Advisory Given, Missing   Pulmonary former smoker,    Pulmonary exam normal breath sounds clear to auscultation       Cardiovascular Exercise Tolerance: Good hypertension, Pt. on medications Normal cardiovascular exam Rhythm:Regular Rate:Normal     Neuro/Psych negative psych ROS   GI/Hepatic Neg liver ROS, hiatal hernia, GERD  Medicated,  Endo/Other  diabetes, Well Controlled, Type 2, Oral Hypoglycemic Agents  Renal/GU Renal InsufficiencyRenal disease  negative genitourinary   Musculoskeletal negative musculoskeletal ROS (+)   Abdominal   Peds  Hematology  (+) anemia ,   Anesthesia Other Findings   Reproductive/Obstetrics negative OB ROS                             Anesthesia Physical Anesthesia Plan  ASA: 2  Anesthesia Plan: General/Spinal   Post-op Pain Management:  Regional for Post-op pain   Induction: Intravenous  PONV Risk Score and Plan: 3 and Ondansetron, Dexamethasone and Midazolam  Airway Management Planned: Nasal Cannula, Natural Airway and Simple Face Mask  Additional Equipment:   Intra-op Plan:   Post-operative Plan:   Informed Consent: I have reviewed the patients History and Physical, chart, labs and discussed the procedure including the risks, benefits and alternatives for the proposed anesthesia with the patient or authorized representative who has indicated his/her understanding and acceptance.     Dental advisory given  Plan Discussed with: CRNA and Surgeon  Anesthesia Plan Comments:          Anesthesia Quick Evaluation

## 2020-08-16 NOTE — Anesthesia Procedure Notes (Addendum)
Spinal  Patient location during procedure: OR Start time: 08/16/2020 7:45 AM Reason for block: surgical anesthesia Staffing Anesthesiologist: Denese Killings, MD Resident/CRNA: Ollen Bowl, CRNA Preanesthetic Checklist Completed: patient identified, IV checked, site marked, risks and benefits discussed, surgical consent, monitors and equipment checked, pre-op evaluation and timeout performed Spinal Block Patient position: sitting Prep: Betadine Patient monitoring: heart rate, cardiac monitor, continuous pulse ox and blood pressure Approach: midline Location: L3-4 Injection technique: single-shot Needle Needle type: Spinocan  Needle gauge: 22 G Needle length: 9 cm Assessment Sensory level: T8 Events: CSF return Additional Notes  ATTEMPTS:2 TRAY SX:[id number] TRAY EXPIRATION DATE:09/16/22 One attempt by CRNA unsuccessful.  Dr. Charna Elizabeth then attempted and was successful with a Quincke 22G, 5 inch spinal needle.

## 2020-08-16 NOTE — Anesthesia Postprocedure Evaluation (Signed)
Anesthesia Post Note  Patient: Robert Bishop  Procedure(s) Performed: RIGHT TOTAL KNEE ARTHROPLASTY (Right: Knee)  Patient location during evaluation: PACU Anesthesia Type: Combined General/Spinal Level of consciousness: awake Pain management: pain level controlled Vital Signs Assessment: post-procedure vital signs reviewed and stable Respiratory status: spontaneous breathing Cardiovascular status: blood pressure returned to baseline and stable Postop Assessment: no apparent nausea or vomiting Anesthetic complications: no   No notable events documented.   Last Vitals:  Vitals:   08/16/20 1200 08/16/20 1230  BP: 133/75 132/75  Pulse: (!) 53 (!) 51  Resp: 17 (!) 8  Temp:    SpO2: 100% 98%    Last Pain:  Vitals:   08/16/20 1230  TempSrc:   PainSc: Asleep                 Lycia Sachdeva

## 2020-08-16 NOTE — Evaluation (Addendum)
Physical Therapy Evaluation Patient Details Name: Robert Bishop MRN: [id number] DOB: 1945-12-31 Today's Date: 08/16/2020   RIGHT KNEE ROM: 0 - 112 degrees AMBULATION DISTANCE: 100 feet using RW with Supervision   History of Present Illness  Robert Bishop  has presented today for surgery, with the diagnosis of Right total knee arthroplasty.  The various methods of treatment have been discussed with the patient and family. After consideration of risks, benefits and other options for treatment, the patient has consented to  Procedure(s):  RIGHT TOTAL KNEE ARTHROPLASTY (Right) as a surgical intervention.  The patient's history has been reviewed, patient examined, no change in status, stable for surgery.  I have reviewed the patient's chart and labs.  Questions were answered to the patient's satisfaction.   Clinical Impression  Patient presents alert and oriented after TKR, and is agreeable to therapy. Patient was instructed on home exercise program including: ankle pumps, quad sets, heel slides, and long arc quads. Patient tolerated exercises well and demonstrated good bed mobility and transfer to seated at EOB. Patient was min guard and supervision for sit to stand and was able to ambulate for 100 feet with RW without loss of balance. Patient was return to bed with ice and compression. Patient will benefit from continued physical therapy in hospital and recommended venue below to increase strength, balance, endurance for safe ADLs and gait.     Follow Up Recommendations Home health PT    Equipment Recommendations  None recommended by PT    Recommendations for Other Services       Precautions / Restrictions Precautions Precautions: Fall Restrictions Weight Bearing Restrictions: Yes RLE Weight Bearing: Weight bearing as tolerated      Mobility  Bed Mobility Overal bed mobility: Modified Independent             General bed mobility comments: increased time, slightly labored  movement Patient Response: Cooperative  Transfers Overall transfer level: Modified independent Equipment used: Rolling walker (2 wheeled)             General transfer comment: use of RW  Ambulation/Gait Ambulation/Gait assistance: Modified independent (Device/Increase time);Supervision Gait Distance (Feet): 100 Feet Assistive device: Rolling walker (2 wheeled) Gait Pattern/deviations: Decreased step length - right;Decreased step length - left;Decreased stance time - right;Decreased stride length Gait velocity: decreased   General Gait Details: slow labored cadence, with a noted decrease in WB on R LE during stance phases  Stairs            Wheelchair Mobility    Modified Rankin (Stroke Patients Only)       Balance Overall balance assessment: Modified Independent;Needs assistance Sitting-balance support: Bilateral upper extremity supported;Feet supported Sitting balance-Leahy Scale: Good Sitting balance - Comments: at EOB   Standing balance support: Bilateral upper extremity supported;During functional activity Standing balance-Leahy Scale: Fair Standing balance comment: Fair/good with RW                             Pertinent Vitals/Pain Pain Assessment: 0-10 Pain Score: 2  Pain Location: R knee Pain Descriptors / Indicators: Sore;Sharp;Aching Pain Intervention(s): Limited activity within patient's tolerance;Monitored during session;Repositioned    Home Living Family/patient expects to be discharged to:: Private residence Living Arrangements: Spouse/significant other Available Help at Discharge: Family Type of Home: House Home Access: Ramped entrance     Home Layout: One level Home Equipment: Environmental consultant - 2 wheels;Shower seat;Grab bars - tub/shower      Prior Function Level  of Independence: Independent         Comments: patient was a [employer] manager        Extremity/Trunk Assessment        Lower  Extremity Assessment Lower Extremity Assessment: RLE deficits/detail RLE Deficits / Details: R TKR RLE Coordination: decreased fine motor;decreased gross motor    Cervical / Trunk Assessment Cervical / Trunk Assessment: Normal  Communication   Communication: No difficulties  Cognition Arousal/Alertness: Awake/alert Behavior During Therapy: WFL for tasks assessed/performed Overall Cognitive Status: Within Functional Limits for tasks assessed                                        General Comments      Exercises Total Joint Exercises Ankle Circles/Pumps: AROM;Supine;Strengthening;Both;20 reps Quad Sets: AROM;Strengthening;20 reps;Supine;Right Heel Slides: AROM;Strengthening;Right;20 reps;Supine Long Arc Quad: AROM;Strengthening;Supine;10 reps;Right Knee Flexion: AROM;10 reps;Right Goniometric ROM: 0 - 112 deg   Assessment/Plan    PT Assessment Patient needs continued PT services  PT Problem List Decreased strength;Decreased activity tolerance;Decreased balance;Decreased mobility;Decreased coordination       PT Treatment Interventions DME instruction;Gait training;Stair training;Functional mobility training;Therapeutic activities;Therapeutic exercise;Balance training;Patient/family education    PT Goals (Current goals can be found in the Care Plan section)  Acute Rehab PT Goals Patient Stated Goal: return home PT Goal Formulation: With patient Time For Goal Achievement: 08/30/20 Potential to Achieve Goals: Good    Frequency BID   Barriers to discharge        Co-evaluation               AM-PAC PT "6 Clicks" Mobility  Outcome Measure Help needed turning from your back to your side while in a flat bed without using bedrails?: None Help needed moving from lying on your back to sitting on the side of a flat bed without using bedrails?: None Help needed moving to and from a bed to a chair (including a wheelchair)?: A Little Help needed standing up  from a chair using your arms (e.g., wheelchair or bedside chair)?: A Little Help needed to walk in hospital room?: A Little Help needed climbing 3-5 steps with a railing? : A Lot 6 Click Score: 19    End of Session   Activity Tolerance: Patient tolerated treatment well;No increased pain Patient left: in bed;with call bell/phone within reach Nurse Communication: Mobility status PT Visit Diagnosis: Unsteadiness on feet (R26.81);Other abnormalities of gait and mobility (R26.89);Muscle weakness (generalized) (M62.81)    Time: 7619-5093 PT Time Calculation (min) (ACUTE ONLY): 23 min   Charges:   PT Evaluation $PT Eval Moderate Complexity: 1 Mod PT Treatments $Therapeutic Activity: 23-37 mins       3:49 PM, 08/16/20 Jeneen Rinks Cousler SPT  3:49 PM, 08/16/20 Lonell Grandchild, MPT Physical Therapist with [facility] [phone] office [phone] mobile phone

## 2020-08-16 NOTE — Interval H&P Note (Signed)
History and Physical Interval Note:  08/16/2020 7:34 AM  Robert Bishop  has presented today for surgery, with the diagnosis of Right total knee arthroplasty.  The various methods of treatment have been discussed with the patient and family. After consideration of risks, benefits and other options for treatment, the patient has consented to  Procedure(s): RIGHT TOTAL KNEE ARTHROPLASTY (Right) as a surgical intervention.  The patient's history has been reviewed, patient examined, no change in status, stable for surgery.  I have reviewed the patient's chart and labs.  Questions were answered to the patient's satisfaction.     Arther Abbott

## 2020-08-16 NOTE — Progress Notes (Signed)
Spoke with pts dtr Thelma to let her know how he is doing.

## 2020-08-16 NOTE — Plan of Care (Addendum)
  Problem: Acute Rehab PT Goals(only PT should resolve) Goal: Pt Will Go Supine/Side To Sit Outcome: Progressing Flowsheets (Taken 08/16/2020 1539) Pt will go Supine/Side to Sit: Independently Goal: Patient Will Transfer Sit To/From Stand Outcome: Progressing Flowsheets (Taken 08/16/2020 1539) Patient will transfer sit to/from stand: Independently Goal: Pt Will Transfer Bed To Chair/Chair To Bed Outcome: Progressing Flowsheets (Taken 08/16/2020 1539) Pt will Transfer Bed to Chair/Chair to Bed: Independently Goal: Pt Will Ambulate Outcome: Progressing Flowsheets (Taken 08/16/2020 1539) Pt will Ambulate:  25 feet  Independently  with least restrictive assistive device Goal: Pt Will Go Up/Down Stairs Outcome: Progressing Flowsheets (Taken 08/16/2020 1539) Pt will Go Up / Down Stairs:  Flight  with min guard assist  with rail(s)  with least restrictive assistive device  3:40 PM, 08/16/20 Sinclair Ship SPT   3:48 PM, 08/16/20 Lonell Grandchild, MPT Physical Therapist with [facility] [phone] office [phone] mobile phone

## 2020-08-16 NOTE — Op Note (Signed)
Dictation for total knee replacement  08/16/2020  2:47 PM  Preop diagnosis osteoarthritis RIGHT  KNEE  Postop diagnosis osteoarthritis RIGHT  KNEE  Procedure RIGHT total knee arthroplasty  Implants   DePuy   Attune, fixed-bearing posterior stabilized total knee: Sizes: Femur   8   tibia  8   patella  41   POLYthylene 5  Bone cuts:  Distal femur  11  PROXIMAL TIBIA 3 FROM MEDIAL REFERENCE  PATELLA 28-11=17         ASSISTANTS: DEBBIE DALLAS AND CYNTHIA WRENN   ANESTHESIA:   SPINAL AND SAPHENOUS N BLOCK   BLOOD ADMINISTERED:none  DRAINS: none   LOCAL MEDICATIONS USED:  EXPAREL  SPECIMEN:  No Specimen  DISPOSITION OF SPECIMEN:  N/A  COUNTS:  YES  TOURNIQUET:  280MM   DICTATION: .Dragon Dictation   The patient was taken to the recovery room in stable condition  PLAN OF CARE: Stable to PACU for admission for observation  PATIENT DISPOSITION:  PACU - hemodynamically stable.   Delay start of Pharmacological VTE agent (>24hrs) due to surgical blood loss or risk of bleeding: not applicable  Details of surgery: The patient was identified by 2 approved identification mechanisms. The operative extremity was evaluated and found to be acceptable for surgical treatment today. The chart was reviewed. The surgical site was confirmed and marked. The patient had a preop saphenous nerve block   The patient was taken to the operating room and given appropriate antibiotic Ancef 2 g. This is consistent with the SCIP protocol.  The patient was given the following anesthetic: Spinal plus preop saphenous nerve block  The patient was then placed supine on the operating table. A Foley catheter was inserted. The operative extremity was prepped and draped sterilely from the toes to the groin.  Timeout was executed confirming the patient's name, surgical site, antibiotic administration, x-rays available, and implants available.  The operative limb,  was exsanguinated with a six-inch  Esmarch and the tourniquet was inflated to 280 mmHg.  A straight midline incision was made over the right KNEE and taken down to the extensor mechanism. A medial arthrotomy was performed. The patella was everted and the patellofemoral soft tissue was released, along with the patellar fat pad.  The anterior cruciate ligament and PCL were resected.  The anterior horns of the lateral and medial meniscus were resected. The medial soft tissue sleeve was elevated to the mid coronal plane.  A three-eighths inch drill bit was used to enter the femoral canal which was decompressed with suction and irrigation until clear.   The distal femoral cutting guide was set for 9 mm distal resection,  5valgus alignment, for a right knee. The distal femur was resected and I did not like the shape of the distal femur and took and I do 2 mm for a total of 11 and then checked for flatness.  This gave the appropriate figure-of-eight configuration  The Depuy Sigma sizing femoral guide was placed and the femur was sized to a size 8.   The external alignment guide for the tibial resection was then applied to the distal and proximal tibia and set for anatomic slope along with 3 MM resection  from the medial.   Rotational alignment was set using the malleolus, the tibial tubercle and the tibial spines.  The proximal tibia was resected along with  residual menisci. The tibia was sized using a base plate to a size 8.   The extension gap was checked.  5  mm block gave full extension   A 4-in-1 cutting block was placed along with collateral ligament retractors and the distal femoral cuts were completed.  Spacer blocks were used to confirm equal flexion extension gaps with releases done as needed. A size 5 MM spacer block gave equal stability and flexion extension.  The correct sized notch cutting guide for the femur was then applied and the notch cut was made.  Trial reduction was completed using size 8 femur 8 tibia 5  poly-trial implants. Patella tracking was normal  We then skeletonized the patella. It measured 28 in thickness and the patellar resection was set for 16 millimeters. the patellar resection was completed.  I took an additional millimeter with a freehand technique the patella diameter measured 41. We then drilled the peg holes for the patella.  Completed patellar thickness was 28 mm  The proximal tibia was prepared using the size 8 base plate.  Thorough irrigation was performed and the bone was dried and prepared for cement. The cement was mixed on the back table using third generation preparation techniques   The implants were then cemented in place and excess cement was removed. The cement was allowed to cure. Irrigation was repeated and excess and residual bone fragments and cement were removed.  After everything was dry and the cement was cured Zynrelef was injected into the joint  The extensor mechanism was closed with #1 Bralon suture followed by subcutaneous tissue closure using 0 Monocryl suture   Skin approximation was performed using staples  A sterile dressing was applied, followed by a ace wrapped from foot to thigh and then a Cryo/Cuff which was activated   The patient was taken recovery room in stable condition

## 2020-08-16 NOTE — Transfer of Care (Signed)
Immediate Anesthesia Transfer of Care Note  Patient: Robert Bishop  Procedure(s) Performed: RIGHT TOTAL KNEE ARTHROPLASTY (Right: Knee)  Patient Location: PACU  Anesthesia Type:Spinal  Level of Consciousness: awake  Airway & Oxygen Therapy: Patient Spontanous Breathing  Post-op Assessment: Report given to RN  Post vital signs: Reviewed and stable  Last Vitals:  Vitals Value Taken Time  BP 123/70 08/16/20 1031  Temp    Pulse 56 08/16/20 1033  Resp 9 08/16/20 1033  SpO2 99 % 08/16/20 1033  Vitals shown include unvalidated device data.  Last Pain:  Vitals:   08/16/20 0640  TempSrc: Oral  PainSc: 0-No pain      Patients Stated Pain Goal: 5 (91/44/45 8483)  Complications: No notable events documented.

## 2020-08-16 NOTE — Anesthesia Procedure Notes (Signed)
Anesthesia Regional Block: Adductor canal block   Pre-Anesthetic Checklist: , timeout performed,  Correct Patient, Correct Site, Correct Laterality,  Correct Procedure, Correct Position, site marked,  Risks and benefits discussed,  Surgical consent,  Pre-op evaluation,  At surgeon's request and post-op pain management  Laterality: Right  Prep: chloraprep       Needles:  Injection technique: Single-shot  Needle Type: Echogenic Stimulator Needle     Needle Length: 10cm  Needle Gauge: 20   Needle insertion depth: 7 cm   Additional Needles:   Procedures:,,,, ultrasound used (permanent image in chart),,     Nerve Stimulator or Paresthesia:  Response: no Twitch elicited, 0.5 mA, 7 cm  Additional Responses:   Narrative:  Start time: 08/16/2020 7:12 AM End time: 08/16/2020 7:21 AM Injection made incrementally with aspirations every 5 mL.  Performed by: Personally  Anesthesiologist: Denese Killings, MD  Additional Notes: BP cuff, EKG monitors applied. Sedation begun. After nerve location anesthetic injected incrementally, slowly , and after neg aspirations. Tolerated well. 28 ml of Bupivacaine 0.25% with 1:200000 epi and dexamethasone 8 mg was injected at right adductor canal block. Total volume 30 ml.

## 2020-08-17 ENCOUNTER — Encounter (HOSPITAL_COMMUNITY): Payer: Self-pay | Admitting: Orthopedic Surgery

## 2020-08-17 DIAGNOSIS — Z7984 Long term (current) use of oral hypoglycemic drugs: Secondary | ICD-10-CM | POA: Diagnosis not present

## 2020-08-17 DIAGNOSIS — M1711 Unilateral primary osteoarthritis, right knee: Secondary | ICD-10-CM | POA: Diagnosis not present

## 2020-08-17 DIAGNOSIS — Z87891 Personal history of nicotine dependence: Secondary | ICD-10-CM | POA: Diagnosis not present

## 2020-08-17 DIAGNOSIS — Z79899 Other long term (current) drug therapy: Secondary | ICD-10-CM | POA: Diagnosis not present

## 2020-08-17 DIAGNOSIS — I10 Essential (primary) hypertension: Secondary | ICD-10-CM | POA: Diagnosis not present

## 2020-08-17 DIAGNOSIS — E119 Type 2 diabetes mellitus without complications: Secondary | ICD-10-CM | POA: Diagnosis not present

## 2020-08-17 LAB — CBC
HCT: 38.7 % — ABNORMAL LOW (ref 39.0–52.0)
Hemoglobin: 11.9 g/dL — ABNORMAL LOW (ref 13.0–17.0)
MCH: 28 pg (ref 26.0–34.0)
MCHC: 30.7 g/dL (ref 30.0–36.0)
MCV: 91.1 fL (ref 80.0–100.0)
Platelets: 142 10*3/uL — ABNORMAL LOW (ref 150–400)
RBC: 4.25 MIL/uL (ref 4.22–5.81)
RDW: 12.5 % (ref 11.5–15.5)
WBC: 9.5 10*3/uL (ref 4.0–10.5)
nRBC: 0 % (ref 0.0–0.2)

## 2020-08-17 LAB — GLUCOSE, CAPILLARY
Glucose-Capillary: 159 mg/dL — ABNORMAL HIGH (ref 70–99)
Glucose-Capillary: 105 mg/dL — ABNORMAL HIGH (ref 70–99)

## 2020-08-17 LAB — BASIC METABOLIC PANEL
Anion gap: 5 (ref 5–15)
BUN: 17 mg/dL (ref 8–23)
CO2: 26 mmol/L (ref 22–32)
Calcium: 8.5 mg/dL — ABNORMAL LOW (ref 8.9–10.3)
Chloride: 104 mmol/L (ref 98–111)
Creatinine, Ser: 1.01 mg/dL (ref 0.61–1.24)
GFR, Estimated: >60 mL/min (ref >60)
Glucose, Bld: 143 mg/dL — ABNORMAL HIGH (ref 70–99)
Potassium: 4.8 mmol/L (ref 3.5–5.1)
Sodium: 135 mmol/L (ref 135–145)

## 2020-08-17 MED ORDER — ASPIRIN 81 MG PO CHEW: 81.0000 mg | CHEWABLE_TABLET | Freq: Two times a day (BID) | ORAL | 0 refills | Status: DC

## 2020-08-17 MED ORDER — TIZANIDINE HCL 4 MG PO TABS: 4.0000 mg | ORAL_TABLET | Freq: Four times a day (QID) | ORAL | 1 refills | Status: AC | PRN

## 2020-08-17 MED ORDER — CELECOXIB 200 MG PO CAPS: 200.0000 mg | ORAL_CAPSULE | Freq: Two times a day (BID) | ORAL | 0 refills | Status: AC

## 2020-08-17 MED ORDER — TRAMADOL HCL 50 MG PO TABS: 50.0000 mg | ORAL_TABLET | Freq: Four times a day (QID) | ORAL | 0 refills | Status: AC

## 2020-08-17 MED ORDER — HYDROCODONE-ACETAMINOPHEN 7.5-325 MG PO TABS: 1.0000 | ORAL_TABLET | ORAL | 0 refills | Status: DC | PRN

## 2020-08-17 MED ORDER — BISACODYL 5 MG PO TBEC: 5.0000 mg | DELAYED_RELEASE_TABLET | Freq: Every day | ORAL | 0 refills | Status: AC | PRN

## 2020-08-17 NOTE — Discharge Summary (Signed)
Physician Discharge Summary  Patient ID: Robert Bishop MRN: [id number] DOB/AGE: [id number] y.o.  Admit date: 08/16/2020 Discharge date: 08/17/2020  Admission Diagnoses: Osteoarthritis right knee  Discharge Diagnoses: Same  Discharged Condition: good  Procedure: Right total knee  Implant attune fixed-bearing posterior stabilized total knee size 8 femur size 8 tibia  Hospital Course:  August 16, 2020 patient had a right total knee no complications anesthesia was spinal with saphenous nerve block via Zynrelef intraoperatively as a pain reliever  June 22 patient cleared physical therapy at over 115 degrees of motion pain well controlled       Discharge Exam: BP 120/81 (BP Location: Left Arm)   Pulse (!) 57   Temp (!) 97.4 F (36.3 C) (Oral)   Resp 18   Ht 5\' 8"  (1.727 m)   Wt 100.7 kg   SpO2 99%   BMI 33.76 kg/m  Physical Exam  Neurovascular exam intact patient awake alert and oriented x3 wound clean calf supple  Disposition: Discharge disposition: 01-Home or Self Care       Discharge Instructions     Call MD / Call 911   Complete by: As directed    If you experience chest pain or shortness of breath, CALL 911 and be transported to the hospital emergency room.  If you develope a fever above 101 F, pus (white drainage) or increased drainage or redness at the wound, or calf pain, call your surgeon's office.   Constipation Prevention   Complete by: As directed    Drink plenty of fluids.  Prune juice may be helpful.  You may use a stool softener, such as Colace (over the counter) 100 mg twice a day.  Use MiraLax (over the counter) for constipation as needed.   Diet - low sodium heart healthy   Complete by: As directed    Discharge instructions   Complete by: As directed    Ice the knee 30 minutes 4-6 times a day and more if needed for pain or throbbing of the knee  Use the continuous passive motion machine 4 hours/day for 2 weeks then call the number on the  machine and tell them to pick it up  Wear the stockings, the white stockings for 2 weeks  You do not have to change the dressing.  If it becomes soiled please call the office to make arrangements for it to be changed with [place]  The blue pillow will go under your heel 3 times a day for 20 minutes  You cannot drive for the next 4 weeks  You will take aspirin twice a day for 30 days  Take the tramadol every 6 hours and then if you are still having pain take the hydrocodone as directed   Increase activity slowly as tolerated   Complete by: As directed    Post-operative opioid taper instructions:   Complete by: As directed    POST-OPERATIVE OPIOID TAPER INSTRUCTIONS: It is important to wean off of your opioid medication as soon as possible. If you do not need pain medication after your surgery it is ok to stop day one. Opioids include: Codeine, Hydrocodone(Norco, Vicodin), Oxycodone(Percocet, oxycontin) and hydromorphone amongst others.  Long term and even short term use of opiods can cause: Increased pain response Dependence Constipation Depression Respiratory depression And more.  Withdrawal symptoms can include Flu like symptoms Nausea, vomiting And more Techniques to manage these symptoms Hydrate well Eat regular healthy meals Stay active Use relaxation techniques(deep breathing, meditating, yoga) Do Not substitute  Alcohol to help with tapering If you have been on opioids for less than two weeks and do not have pain than it is ok to stop all together.  Plan to wean off of opioids This plan should start within one week post op of your joint replacement. Maintain the same interval or time between taking each dose and first decrease the dose.  Cut the total daily intake of opioids by one tablet each day Next start to increase the time between doses. The last dose that should be eliminated is the evening dose.         Allergies as of 08/17/2020       Reactions    Oxycodone-acetaminophen Nausea And Vomiting        Medication List     STOP taking these medications    aspirin EC 81 MG tablet Replaced by: aspirin 81 MG chewable tablet       TAKE these medications    amLODipine 10 MG tablet Commonly known as: NORVASC Take 10 mg by mouth daily.   aspirin 81 MG chewable tablet Chew 1 tablet (81 mg total) by mouth 2 (two) times daily. Replaces: aspirin EC 81 MG tablet   atorvastatin 10 MG tablet Commonly known as: LIPITOR Take 10 mg by mouth daily.   bisacodyl 5 MG EC tablet Commonly known as: DULCOLAX Take 1 tablet (5 mg total) by mouth daily as needed for moderate constipation.   celecoxib 200 MG capsule Commonly known as: CELEBREX Take 1 capsule (200 mg total) by mouth 2 (two) times daily.   cholecalciferol 25 MCG (1000 UNIT) tablet Commonly known as: VITAMIN D Take 1,000 Units by mouth daily.   HYDROcodone-acetaminophen 7.5-325 MG tablet Commonly known as: NORCO Take 1 tablet by mouth every 4 (four) hours as needed for moderate pain.   metFORMIN 500 MG 24 hr tablet Commonly known as: GLUCOPHAGE-XR Take 500 mg by mouth daily.   tiZANidine 4 MG tablet Commonly known as: ZANAFLEX Take 1 tablet (4 mg total) by mouth every 6 (six) hours as needed for up to 14 days for muscle spasms.   traMADol 50 MG tablet Commonly known as: ULTRAM Take 1 tablet (50 mg total) by mouth 4 (four) times daily for 7 days.         Signed: Arther Abbott 08/17/2020, 1:30 PM

## 2020-08-17 NOTE — Progress Notes (Signed)
Patient lying in bed this morning. No complaints of pain. This nurse in to remove Foley catheter on POD 1. Balloon deflated and catheter removed. Leg strap removed from patient's skin. Patient tolerated procedure well. Bed in lowest position, wheels locked, call bell within reach.

## 2020-08-17 NOTE — Progress Notes (Addendum)
Physical Therapy Treatment Patient Details Name: Robert Bishop MRN: [id number] DOB: Jul 14, 1945 Today's Date: 08/17/2020   RIGHT KNEE ROM: 0 - 122 degrees AMBULATION DISTANCE: 200 feet using RW with supervision, ascend/descend 4 steps with 1 arm on railing without loss of balance     History of Present Illness Robert Bishop  has presented today for surgery, with the diagnosis of Right total knee arthroplasty.  The various methods of treatment have been discussed with the patient and family. After consideration of risks, benefits and other options for treatment, the patient has consented to  Procedure(s):  RIGHT TOTAL KNEE ARTHROPLASTY (Right) as a surgical intervention.  The patient's history has been reviewed, patient examined, no change in status, stable for surgery.  I have reviewed the patient's chart and labs.  Questions were answered to the patient's satisfaction.    PT Comments    Patient presents in bed and agreeable to therapy with reports of minimal pain of 3/10. Patient demonstrated independence with HEP including: ankle pumps, quad sets, heel slides, long arc quads and knee flexion stretch. Patient is modified independent for bed mobility and transfers. Patients demonstrated good seated and standing balance with RW. Patient was able to ambulate without an increase in pain for 200 feet with only a slight decrease in WB on RLE. Patient ascended/descended 4 steps with proper gait mechanics for safety. Patient returned to chair with concluding therapy. Patient will benefit from continued physical therapy in hospital and recommended venue below to increase strength, balance, endurance for safe ADLs and gait.   Follow Up Recommendations  Home health PT     Equipment Recommendations  None recommended by PT    Recommendations for Other Services       Precautions / Restrictions Precautions Precautions: Fall Restrictions Weight Bearing Restrictions: Yes RLE Weight Bearing: Weight  bearing as tolerated    Mobility  Bed Mobility Overal bed mobility: Modified Independent             General bed mobility comments: slight guarding of R knee Patient Response: Cooperative  Transfers Overall transfer level: Modified independent Equipment used: Rolling walker (2 wheeled)             General transfer comment: independent with RW  Ambulation/Gait Ambulation/Gait assistance: Supervision;Modified independent (Device/Increase time) Gait Distance (Feet): 200 Feet Assistive device: Rolling walker (2 wheeled) Gait Pattern/deviations: Decreased step length - right;Decreased step length - left;Decreased stance time - right;Decreased stride length Gait velocity: decreased   General Gait Details: slight labored cadence with decrease WB on RLE during stance phase   Stairs Stairs: Yes Stairs assistance: Modified independent (Device/Increase time);Supervision Stair Management: One rail Right Number of Stairs: 4 General stair comments: patient ascended 4 steps with proper gait of leading with left foot, decending 4 stairs leading with RLE   Wheelchair Mobility    Modified Rankin (Stroke Patients Only)       Balance Overall balance assessment: Modified Independent Sitting-balance support: Feet supported Sitting balance-Leahy Scale: Good Sitting balance - Comments: at EOB   Standing balance support: Bilateral upper extremity supported;During functional activity Standing balance-Leahy Scale: Good Standing balance comment: good with RW                            Cognition Arousal/Alertness: Awake/alert Behavior During Therapy: WFL for tasks assessed/performed Overall Cognitive Status: Within Functional Limits for tasks assessed  Exercises Total Joint Exercises Ankle Circles/Pumps: AROM;Supine;Strengthening;Both;20 reps Quad Sets: AROM;Strengthening;20 reps;Supine;Right Heel Slides:  AROM;Strengthening;Right;20 reps;Supine Long Arc Quad: AROM;Strengthening;10 reps;Right;Seated Knee Flexion: AROM;10 reps;Right;Seated Goniometric ROM: 0 - 122    General Comments        Pertinent Vitals/Pain Pain Assessment: 0-10 Pain Score: 3  Pain Location: R knee Pain Descriptors / Indicators: Sore;Aching Pain Intervention(s): Monitored during session;Limited activity within patient's tolerance;Repositioned    Home Living                      Prior Function            PT Goals (current goals can now be found in the care plan section) Acute Rehab PT Goals Patient Stated Goal: return home PT Goal Formulation: With patient Time For Goal Achievement: 08/30/20 Potential to Achieve Goals: Good Progress towards PT goals: Progressing toward goals    Frequency    BID      PT Plan Current plan remains appropriate    Co-evaluation              AM-PAC PT "6 Clicks" Mobility   Outcome Measure  Help needed turning from your back to your side while in a flat bed without using bedrails?: None Help needed moving from lying on your back to sitting on the side of a flat bed without using bedrails?: None Help needed moving to and from a bed to a chair (including a wheelchair)?: None   Help needed to walk in hospital room?: A Little Help needed climbing 3-5 steps with a railing? : A Little 6 Click Score: 18    End of Session   Activity Tolerance: Patient tolerated treatment well;No increased pain Patient left: in chair;with call bell/phone within reach Nurse Communication: Mobility status PT Visit Diagnosis: Unsteadiness on feet (R26.81);Other abnormalities of gait and mobility (R26.89);Muscle weakness (generalized) (M62.81)     Time: 3159-4585 PT Time Calculation (min) (ACUTE ONLY): 21 min  Charges:  $Gait Training: 8-22 mins $Therapeutic Exercise: 8-22 mins                    9:58 AM, 08/17/20 Sinclair Ship SPT  9:58 AM, 08/17/20 Lonell Grandchild,  MPT Physical Therapist with [facility] [phone] office [phone] mobile phone

## 2020-08-18 DIAGNOSIS — K219 Gastro-esophageal reflux disease without esophagitis: Secondary | ICD-10-CM | POA: Diagnosis not present

## 2020-08-18 DIAGNOSIS — I10 Essential (primary) hypertension: Secondary | ICD-10-CM | POA: Diagnosis not present

## 2020-08-18 DIAGNOSIS — Z96651 Presence of right artificial knee joint: Secondary | ICD-10-CM | POA: Diagnosis not present

## 2020-08-18 DIAGNOSIS — E119 Type 2 diabetes mellitus without complications: Secondary | ICD-10-CM | POA: Diagnosis not present

## 2020-08-18 DIAGNOSIS — Z87891 Personal history of nicotine dependence: Secondary | ICD-10-CM | POA: Diagnosis not present

## 2020-08-18 DIAGNOSIS — D649 Anemia, unspecified: Secondary | ICD-10-CM | POA: Diagnosis not present

## 2020-08-18 DIAGNOSIS — H449 Unspecified disorder of globe: Secondary | ICD-10-CM | POA: Diagnosis not present

## 2020-08-18 DIAGNOSIS — Z7984 Long term (current) use of oral hypoglycemic drugs: Secondary | ICD-10-CM | POA: Diagnosis not present

## 2020-08-18 DIAGNOSIS — Z471 Aftercare following joint replacement surgery: Secondary | ICD-10-CM | POA: Diagnosis not present

## 2020-08-20 DIAGNOSIS — Z471 Aftercare following joint replacement surgery: Secondary | ICD-10-CM | POA: Diagnosis not present

## 2020-08-20 DIAGNOSIS — K219 Gastro-esophageal reflux disease without esophagitis: Secondary | ICD-10-CM | POA: Diagnosis not present

## 2020-08-20 DIAGNOSIS — H449 Unspecified disorder of globe: Secondary | ICD-10-CM | POA: Diagnosis not present

## 2020-08-20 DIAGNOSIS — E119 Type 2 diabetes mellitus without complications: Secondary | ICD-10-CM | POA: Diagnosis not present

## 2020-08-20 DIAGNOSIS — D649 Anemia, unspecified: Secondary | ICD-10-CM | POA: Diagnosis not present

## 2020-08-20 DIAGNOSIS — I10 Essential (primary) hypertension: Secondary | ICD-10-CM | POA: Diagnosis not present

## 2020-08-23 DIAGNOSIS — I10 Essential (primary) hypertension: Secondary | ICD-10-CM | POA: Diagnosis not present

## 2020-08-23 DIAGNOSIS — D649 Anemia, unspecified: Secondary | ICD-10-CM | POA: Diagnosis not present

## 2020-08-23 DIAGNOSIS — H449 Unspecified disorder of globe: Secondary | ICD-10-CM | POA: Diagnosis not present

## 2020-08-23 DIAGNOSIS — E119 Type 2 diabetes mellitus without complications: Secondary | ICD-10-CM | POA: Diagnosis not present

## 2020-08-23 DIAGNOSIS — Z471 Aftercare following joint replacement surgery: Secondary | ICD-10-CM | POA: Diagnosis not present

## 2020-08-23 DIAGNOSIS — K219 Gastro-esophageal reflux disease without esophagitis: Secondary | ICD-10-CM | POA: Diagnosis not present

## 2020-08-25 DIAGNOSIS — I10 Essential (primary) hypertension: Secondary | ICD-10-CM | POA: Diagnosis not present

## 2020-08-25 DIAGNOSIS — E119 Type 2 diabetes mellitus without complications: Secondary | ICD-10-CM | POA: Diagnosis not present

## 2020-08-25 DIAGNOSIS — K219 Gastro-esophageal reflux disease without esophagitis: Secondary | ICD-10-CM | POA: Diagnosis not present

## 2020-08-25 DIAGNOSIS — Z471 Aftercare following joint replacement surgery: Secondary | ICD-10-CM | POA: Diagnosis not present

## 2020-08-25 DIAGNOSIS — H449 Unspecified disorder of globe: Secondary | ICD-10-CM | POA: Diagnosis not present

## 2020-08-25 DIAGNOSIS — D649 Anemia, unspecified: Secondary | ICD-10-CM | POA: Diagnosis not present

## 2020-08-31 ENCOUNTER — Ambulatory Visit (INDEPENDENT_AMBULATORY_CARE_PROVIDER_SITE_OTHER): Payer: Medicare Other | Admitting: Orthopedic Surgery

## 2020-08-31 ENCOUNTER — Other Ambulatory Visit: Payer: Self-pay

## 2020-08-31 DIAGNOSIS — H449 Unspecified disorder of globe: Secondary | ICD-10-CM | POA: Diagnosis not present

## 2020-08-31 DIAGNOSIS — E119 Type 2 diabetes mellitus without complications: Secondary | ICD-10-CM | POA: Diagnosis not present

## 2020-08-31 DIAGNOSIS — Z471 Aftercare following joint replacement surgery: Secondary | ICD-10-CM | POA: Diagnosis not present

## 2020-08-31 DIAGNOSIS — K219 Gastro-esophageal reflux disease without esophagitis: Secondary | ICD-10-CM | POA: Diagnosis not present

## 2020-08-31 DIAGNOSIS — D649 Anemia, unspecified: Secondary | ICD-10-CM | POA: Diagnosis not present

## 2020-08-31 DIAGNOSIS — I10 Essential (primary) hypertension: Secondary | ICD-10-CM | POA: Diagnosis not present

## 2020-08-31 DIAGNOSIS — Z96651 Presence of right artificial knee joint: Secondary | ICD-10-CM

## 2020-08-31 NOTE — Patient Instructions (Addendum)
You can take a shower  Call for therapy people in [place] to schedule an appointment for outpatient therapyPhone: [phone]   Medications continue the aspirin until July 19 Continue the Celebrex until the prescription runs out Call us if you need more pain medication hydrocodone with acetaminophen, remember it is an opioid try to take as little as possible and only when needed Use ice is much as possible first to control pain and then if you are still having pain after taking Celebrex and using the ice then take the hydrocodone

## 2020-08-31 NOTE — Progress Notes (Signed)
POST OP   Chief Complaint  Patient presents with   Routine Post Op    RT TKA/DOS 08/16/20    Encounter Diagnosis  Name Primary?   Status post total right knee replacement August 16, 2020 Yes    PROCEDURE: Right total knee attune fixed-bearing posterior stabilized  POV #1  Meds related: Zanaflex Norco Celebrex aspirin  Staples were removed is not having any trouble really he is got 0 to 112 degree range of motion he is walking very well with a walker which she can eliminate soon  It is okay for him to drive  4-week follow-up

## 2020-09-02 DIAGNOSIS — H449 Unspecified disorder of globe: Secondary | ICD-10-CM | POA: Diagnosis not present

## 2020-09-02 DIAGNOSIS — Z471 Aftercare following joint replacement surgery: Secondary | ICD-10-CM | POA: Diagnosis not present

## 2020-09-02 DIAGNOSIS — K219 Gastro-esophageal reflux disease without esophagitis: Secondary | ICD-10-CM | POA: Diagnosis not present

## 2020-09-02 DIAGNOSIS — D649 Anemia, unspecified: Secondary | ICD-10-CM | POA: Diagnosis not present

## 2020-09-02 DIAGNOSIS — I10 Essential (primary) hypertension: Secondary | ICD-10-CM | POA: Diagnosis not present

## 2020-09-02 DIAGNOSIS — E119 Type 2 diabetes mellitus without complications: Secondary | ICD-10-CM | POA: Diagnosis not present

## 2020-09-05 ENCOUNTER — Other Ambulatory Visit: Payer: Self-pay

## 2020-09-05 ENCOUNTER — Ambulatory Visit: Payer: Medicare Other | Attending: Orthopedic Surgery | Admitting: Physical Therapy

## 2020-09-05 ENCOUNTER — Encounter: Payer: Self-pay | Admitting: Physical Therapy

## 2020-09-05 DIAGNOSIS — G8929 Other chronic pain: Secondary | ICD-10-CM | POA: Insufficient documentation

## 2020-09-05 DIAGNOSIS — M25661 Stiffness of right knee, not elsewhere classified: Secondary | ICD-10-CM | POA: Insufficient documentation

## 2020-09-05 DIAGNOSIS — M25561 Pain in right knee: Secondary | ICD-10-CM | POA: Insufficient documentation

## 2020-09-05 DIAGNOSIS — R600 Localized edema: Secondary | ICD-10-CM | POA: Insufficient documentation

## 2020-09-05 NOTE — Therapy (Signed)
[facility] [place], [place], [address] Phone: [phone]   Fax:  [phone]  Physical Therapy Evaluation  Patient Details  Name: Robert Bishop MRN: [id number] Date of Birth: [birth year] Referring Provider (PT): Arther Abbott MD   Encounter Date: 09/05/2020   PT End of Session - 09/05/20 1133     Visit Number 1    Number of Visits 12    Date for PT Re-Evaluation 12/04/20    Authorization Type FOTO AT LEAST EVERY 5TH VISIT.  PROGRESS NOTE AT 10TH VISIT.  KX MODIFIER AFTER 15 VISITS.    PT Start Time [phone]    PT Stop Time 0929    PT Time Calculation (min) 31 min    Activity Tolerance Patient tolerated treatment well    Behavior During Therapy WFL for tasks assessed/performed             Past Medical History:  Diagnosis Date   Anemia    Diabetes mellitus without complication (HCC)    GERD (gastroesophageal reflux disease)    pt states no problem since hernia repair   H/O hiatal hernia    Hematuria    History of blood transfusion yrs ago   Hypertension    Lesion of bladder    Wears glasses     Past Surgical History:  Procedure Laterality Date   CYSTOSCOPY N/A 08/10/2014   Procedure: CYSTOSCOPY FLEXIBLE AND REMOVAL OF FOREIGN BODY ( Vascular Clip);  Surgeon: Irine Seal, MD;  Location: [facility];  Service: Urology;  Laterality: N/A;   CYSTOSCOPY WITH BIOPSY N/A 06/12/2012   Procedure: CYSTOSCOPY BLADDER BIOPSY WITH FULGURATION;  Surgeon: Malka So, MD;  Location: [facility];  Service: Urology;  Laterality: N/A;   HERNIA REPAIR     hiatal   PENILE PROSTHESIS IMPLANT N/A 08/18/2013   Procedure: INSERTION OF COLOPLAST TITAN PENILE PROSTHESIS INFRAPUBIC APPROACH;  Surgeon: Malka So, MD;  Location: [facility];  Service: Urology;  Laterality: N/A;   ROBOT ASSISTED LAPAROSCOPIC RADICAL PROSTATECTOMY N/A 08/06/2012   Procedure: ROBOTIC ASSISTED LAPAROSCOPIC RADICAL PROSTATECTOMY WITH NODE DISSECTION  AND LEFT NERVE SPARE ONLY;  Surgeon: Malka So, MD;  Location: [facility];  Service: Urology;  Laterality: N/A;   TOTAL KNEE ARTHROPLASTY Right 08/16/2020   Procedure: RIGHT TOTAL KNEE ARTHROPLASTY;  Surgeon: Carole Civil, MD;  Location: [facility];  Service: Orthopedics;  Laterality: Right;    There were no vitals filed for this visit.    Subjective Assessment - 09/05/20 1139     Subjective COVID-19 screen performed prior to patient entering clinic.  The patient presents to the clinic s/p right total knee replacement performed on 08/16/20.  His pain is minimal at most today at rest.  He is walking safely with a straight cane.  he is very pleased with his outcome thus far.    Pertinent History DM, HTN, Hernia repair.    Patient Stated Goals Get back to normal.    Currently in Pain? Yes    Pain Orientation Right    Pain Descriptors / Indicators Dull    Pain Type Surgical pain    Pain Onset 1 to 4 weeks ago                [facility] PT Assessment - 09/05/20 0001       Assessment   Medical Diagnosis Right total knee replacement.    Referring Provider (PT) Arther Abbott MD    Onset Date/Surgical Date 08/16/20  Precautions   Precaution Comments No ultrasound.      Restrictions   Weight Bearing Restrictions No      Balance Screen   Has the patient fallen in the past 6 months No    Has the patient had a decrease in activity level because of a fear of falling?  No    Is the patient reluctant to leave their home because of a fear of falling?  No      Home Environment   Living Environment Private residence      Prior Function   Level of Independence Independent      Observation/Other Assessments   Observations Right knee incisional site appears to be healing well.    Focus on Therapeutic Outcomes (FOTO)  Complete.      Observation/Other Assessments-Edema    Edema Circumferential      Circumferential Edema   Circumferential - Right Apex of patella:  RT 8 cms > LT.       ROM / Strength   AROM / PROM / Strength AROM;Strength      AROM   Overall AROM Comments Full right knee extension and active flexion to 120 degrees.      Strength   Overall Strength Comments Right hip and knee strength is 4+/5.      Palpation   Palpation comment Minimal right anterior knee discomfort.      Ambulation/Gait   Gait Comments Essentially normal gait cycle with straight cane.                        Objective measurements completed on examination: See above findings.       [facility] Adult PT Treatment/Exercise - 09/05/20 0001       Modalities   Modalities Vasopneumatic      Vasopneumatic   Number Minutes Vasopneumatic  10 minutes    Vasopnuematic Location  --   Right knee.   Vasopneumatic Pressure Low                         PT Long Term Goals - 09/05/20 1150       PT LONG TERM GOAL #1   Title Independent with a HEP.    Time 6    Period Weeks    Status New      PT LONG TERM GOAL #2   Title Right knee active flexion to 125 degrees.    Time 6    Period Weeks    Status New      PT LONG TERM GOAL #3   Title Increase right hip and knee strength to a solid 5/5 to provide good stability for accomplishment of functional activities.    Time 6    Period Weeks    Status New      PT LONG TERM GOAL #4   Title Perform ADL's with pain not > 2/10.    Time 6    Period Weeks    Status New      PT LONG TERM GOAL #5   Title Perform a reciprocating stair gait with one railing with pain not > 2-3/10.    Time 6    Period Weeks    Status New                    Plan - 09/05/20 1145     Clinical Impression Statement The patient presents to OPPT s/p right total knee replacement  performed on 08/16/20.  He is doing very well with active ROM 0 to 120 degrees.  He is walking safely with a straight cane.  He has a significant amount of edema when assessed at the apex of the patella and compared contralaterally. Patient will  benefit from skilled physical therapy intervention to address pain and deficits.    Personal Factors and Comorbidities Comorbidity 1;Other    Comorbidities DM, HTN, Hernia repair.    Examination-Activity Limitations Other;Locomotion Level    Examination-Participation Restrictions Other    Stability/Clinical Decision Making Stable/Uncomplicated    Rehab Potential Good    PT Frequency 2x / week    PT Duration 6 weeks    PT Treatment/Interventions ADLs/Self Care Home Management;Cryotherapy;Electrical Stimulation;Moist Heat;Gait training;Stair training;Functional mobility training;Therapeutic activities;Therapeutic exercise;Manual techniques;Patient/family education;Passive range of motion;Vasopneumatic Device    PT Next Visit Plan Recumbent bike.  Progress per TKA protocol.  PRE's.  Vasopneumatic.    Consulted and Agree with Plan of Care Patient             Patient will benefit from skilled therapeutic intervention in order to improve the following deficits and impairments:  Pain, Abnormal gait, Decreased range of motion, Decreased strength, Increased edema  Visit Diagnosis: Chronic pain of right knee - Plan: PT plan of care cert/re-cert  Localized edema - Plan: PT plan of care cert/re-cert  Stiffness of right knee, not elsewhere classified - Plan: PT plan of care cert/re-cert     Problem List Patient Active Problem List   Diagnosis Date Noted   S/P TKR (total knee replacement), right 08/16/2020   Primary osteoarthritis of right knee    Erectile dysfunction following radical prostatectomy 08/18/2013    Avri Paiva, [name] MPT 09/05/2020, 11:56 AM  [facility] [address] [place], [place], [address] Phone: [phone]   Fax:  [phone]  Name: Stonewall Doss MRN: [id number] Date of Birth: Sep 26, 1945

## 2020-09-07 ENCOUNTER — Ambulatory Visit: Payer: Medicare Other | Admitting: Physical Therapy

## 2020-09-07 ENCOUNTER — Other Ambulatory Visit: Payer: Self-pay

## 2020-09-07 DIAGNOSIS — R600 Localized edema: Secondary | ICD-10-CM

## 2020-09-07 DIAGNOSIS — M25661 Stiffness of right knee, not elsewhere classified: Secondary | ICD-10-CM | POA: Diagnosis not present

## 2020-09-07 DIAGNOSIS — G8929 Other chronic pain: Secondary | ICD-10-CM

## 2020-09-07 DIAGNOSIS — M25561 Pain in right knee: Secondary | ICD-10-CM | POA: Diagnosis not present

## 2020-09-07 NOTE — Therapy (Signed)
[facility] [place], [place], [address] Phone: [phone]   Fax:  [phone]  Physical Therapy Treatment  Patient Details  Name: Robert Bishop MRN: [id number] Date of Birth: [birth year] Referring Provider (PT): Arther Abbott MD   Encounter Date: 09/07/2020   PT End of Session - 09/07/20 1034     Visit Number 2    Number of Visits 12    Date for PT Re-Evaluation 12/04/20    Authorization Type FOTO AT LEAST EVERY 5TH VISIT.  PROGRESS NOTE AT 10TH VISIT.  KX MODIFIER AFTER 15 VISITS.    PT Start Time 0900    PT Stop Time [phone]    PT Time Calculation (min) 52 min    Activity Tolerance Patient tolerated treatment well    Behavior During Therapy WFL for tasks assessed/performed             Past Medical History:  Diagnosis Date   Anemia    Diabetes mellitus without complication (HCC)    GERD (gastroesophageal reflux disease)    pt states no problem since hernia repair   H/O hiatal hernia    Hematuria    History of blood transfusion yrs ago   Hypertension    Lesion of bladder    Wears glasses     Past Surgical History:  Procedure Laterality Date   CYSTOSCOPY N/A 08/10/2014   Procedure: CYSTOSCOPY FLEXIBLE AND REMOVAL OF FOREIGN BODY ( Vascular Clip);  Surgeon: Irine Seal, MD;  Location: [facility];  Service: Urology;  Laterality: N/A;   CYSTOSCOPY WITH BIOPSY N/A 06/12/2012   Procedure: CYSTOSCOPY BLADDER BIOPSY WITH FULGURATION;  Surgeon: Malka So, MD;  Location: [facility];  Service: Urology;  Laterality: N/A;   HERNIA REPAIR     hiatal   PENILE PROSTHESIS IMPLANT N/A 08/18/2013   Procedure: INSERTION OF COLOPLAST TITAN PENILE PROSTHESIS INFRAPUBIC APPROACH;  Surgeon: Malka So, MD;  Location: [facility];  Service: Urology;  Laterality: N/A;   ROBOT ASSISTED LAPAROSCOPIC RADICAL PROSTATECTOMY N/A 08/06/2012   Procedure: ROBOTIC ASSISTED LAPAROSCOPIC RADICAL PROSTATECTOMY WITH NODE DISSECTION AND  LEFT NERVE SPARE ONLY;  Surgeon: Malka So, MD;  Location: [facility];  Service: Urology;  Laterality: N/A;   TOTAL KNEE ARTHROPLASTY Right 08/16/2020   Procedure: RIGHT TOTAL KNEE ARTHROPLASTY;  Surgeon: Carole Civil, MD;  Location: [facility];  Service: Orthopedics;  Laterality: Right;    There were no vitals filed for this visit.   Subjective Assessment - 09/07/20 1031     Subjective COVID-19 screen performed prior to patient entering clinic.  No new complaints.    Pertinent History DM, HTN, Hernia repair.    Patient Stated Goals Get back to normal.    Currently in Pain? No/denies                               [facility] Adult PT Treatment/Exercise - 09/07/20 0001       Exercises   Exercises Knee/Hip      Knee/Hip Exercises: Aerobic   Recumbent Bike Level 3 x 5 minutes.    Nustep Level 3 x 10 minutes.      Knee/Hip Exercises: Supine   Short Arc Quad Sets Limitations 16 minutes faciltated with Bi-Phasic electrical stimulation with 10 sec extension holds f/b 10 sec rest.      Vasopneumatic   Number Minutes Vasopneumatic  15 minutes    Vasopnuematic Location  --  Right knee.   Vasopneumatic Pressure Medium                         PT Long Term Goals - 09/05/20 1150       PT LONG TERM GOAL #1   Title Independent with a HEP.    Time 6    Period Weeks    Status New      PT LONG TERM GOAL #2   Title Right knee active flexion to 125 degrees.    Time 6    Period Weeks    Status New      PT LONG TERM GOAL #3   Title Increase right hip and knee strength to a solid 5/5 to provide good stability for accomplishment of functional activities.    Time 6    Period Weeks    Status New      PT LONG TERM GOAL #4   Title Perform ADL's with pain not > 2/10.    Time 6    Period Weeks    Status New      PT LONG TERM GOAL #5   Title Perform a reciprocating stair gait with one railing with pain not > 2-3/10.    Time 6    Period Weeks    Status  New                   Plan - 09/07/20 1035     Clinical Impression Statement Patient doing very well.  Able to perform recumbent bike today.  He exhibited very good right quadriceps activation faciltated with Bi-phasic electrical stimulation.    Personal Factors and Comorbidities Comorbidity 1;Other    Comorbidities DM, HTN, Hernia repair.    Examination-Activity Limitations Other;Locomotion Level    Examination-Participation Restrictions Other    Stability/Clinical Decision Making Stable/Uncomplicated    Rehab Potential Excellent    PT Frequency 2x / week    PT Duration 6 weeks    PT Treatment/Interventions ADLs/Self Care Home Management;Cryotherapy;Electrical Stimulation;Moist Heat;Gait training;Stair training;Functional mobility training;Therapeutic activities;Therapeutic exercise;Manual techniques;Patient/family education;Passive range of motion;Vasopneumatic Device    PT Next Visit Plan Recumbent bike.  Progress per TKA protocol.  PRE's.  Vasopneumatic.    Consulted and Agree with Plan of Care Patient             Patient will benefit from skilled therapeutic intervention in order to improve the following deficits and impairments:  Pain, Abnormal gait, Decreased range of motion, Decreased strength, Increased edema  Visit Diagnosis: Chronic pain of right knee  Localized edema  Stiffness of right knee, not elsewhere classified     Problem List Patient Active Problem List   Diagnosis Date Noted   S/P TKR (total knee replacement), right 08/16/2020   Primary osteoarthritis of right knee    Erectile dysfunction following radical prostatectomy 08/18/2013    Robert Bishop, [name] MPT 09/07/2020, 10:37 AM  [facility] [address] [place], [place], [address] Phone: [phone]   Fax:  [phone]  Name: Robert Bishop MRN: [id number] Date of Birth: Dec 07, 1945

## 2020-09-08 ENCOUNTER — Other Ambulatory Visit: Payer: Self-pay | Admitting: Orthopedic Surgery

## 2020-09-09 ENCOUNTER — Other Ambulatory Visit: Payer: Self-pay

## 2020-09-09 ENCOUNTER — Ambulatory Visit: Payer: Medicare Other | Admitting: Physical Therapy

## 2020-09-09 DIAGNOSIS — R600 Localized edema: Secondary | ICD-10-CM

## 2020-09-09 DIAGNOSIS — G8929 Other chronic pain: Secondary | ICD-10-CM

## 2020-09-09 DIAGNOSIS — M25661 Stiffness of right knee, not elsewhere classified: Secondary | ICD-10-CM

## 2020-09-09 DIAGNOSIS — M25561 Pain in right knee: Secondary | ICD-10-CM | POA: Diagnosis not present

## 2020-09-09 NOTE — Therapy (Signed)
[facility] [place], [place], [address] Phone: [phone]   Fax:  [phone]  Physical Therapy Treatment  Patient Details  Name: Robert Bishop MRN: [id number] Date of Birth: [birth year] Referring Provider (PT): Arther Abbott MD   Encounter Date: 09/09/2020   PT End of Session - 09/09/20 1242     Visit Number 3    Number of Visits 12    Date for PT Re-Evaluation 12/04/20    Authorization Type FOTO AT LEAST EVERY 5TH VISIT.  PROGRESS NOTE AT 10TH VISIT.  KX MODIFIER AFTER 15 VISITS.    PT Start Time 0900    PT Stop Time 0954    PT Time Calculation (min) 54 min    Activity Tolerance Patient tolerated treatment well    Behavior During Therapy WFL for tasks assessed/performed             Past Medical History:  Diagnosis Date   Anemia    Diabetes mellitus without complication (HCC)    GERD (gastroesophageal reflux disease)    pt states no problem since hernia repair   H/O hiatal hernia    Hematuria    History of blood transfusion yrs ago   Hypertension    Lesion of bladder    Wears glasses     Past Surgical History:  Procedure Laterality Date   CYSTOSCOPY N/A 08/10/2014   Procedure: CYSTOSCOPY FLEXIBLE AND REMOVAL OF FOREIGN BODY ( Vascular Clip);  Surgeon: Irine Seal, MD;  Location: [facility];  Service: Urology;  Laterality: N/A;   CYSTOSCOPY WITH BIOPSY N/A 06/12/2012   Procedure: CYSTOSCOPY BLADDER BIOPSY WITH FULGURATION;  Surgeon: Malka So, MD;  Location: [facility];  Service: Urology;  Laterality: N/A;   HERNIA REPAIR     hiatal   PENILE PROSTHESIS IMPLANT N/A 08/18/2013   Procedure: INSERTION OF COLOPLAST TITAN PENILE PROSTHESIS INFRAPUBIC APPROACH;  Surgeon: Malka So, MD;  Location: [facility];  Service: Urology;  Laterality: N/A;   ROBOT ASSISTED LAPAROSCOPIC RADICAL PROSTATECTOMY N/A 08/06/2012   Procedure: ROBOTIC ASSISTED LAPAROSCOPIC RADICAL PROSTATECTOMY WITH NODE DISSECTION AND  LEFT NERVE SPARE ONLY;  Surgeon: Malka So, MD;  Location: [facility];  Service: Urology;  Laterality: N/A;   TOTAL KNEE ARTHROPLASTY Right 08/16/2020   Procedure: RIGHT TOTAL KNEE ARTHROPLASTY;  Surgeon: Carole Civil, MD;  Location: [facility];  Service: Orthopedics;  Laterality: Right;    There were no vitals filed for this visit.   Subjective Assessment - 09/09/20 1244     Subjective COVID-19 screen performed prior to patient entering clinic.  My knee pops and clicks sometimes.    Pertinent History DM, HTN, Hernia repair.    Patient Stated Goals Get back to normal.    Currently in Pain? No/denies    Pain Orientation Right    Pain Descriptors / Indicators Dull    Pain Type Surgical pain    Pain Onset 1 to 4 weeks ago                               [facility] Adult PT Treatment/Exercise - 09/09/20 0001       Exercises   Exercises Knee/Hip      Knee/Hip Exercises: Aerobic   Recumbent Bike Level 3 x 15 minutes progressing to seat 6.      Knee/Hip Exercises: Supine   Short Arc Quad Sets Limitations 16 minutes facilitated with Bi-Phasic to patient's right  quadriceps with 10 sec extension holds adn 10 sec rest with 3#.      Vasopneumatic   Number Minutes Vasopneumatic  15 minutes    Vasopnuematic Location  --   Right knee.   Vasopneumatic Pressure Medium                         PT Long Term Goals - 09/05/20 1150       PT LONG TERM GOAL #1   Title Independent with a HEP.    Time 6    Period Weeks    Status New      PT LONG TERM GOAL #2   Title Right knee active flexion to 125 degrees.    Time 6    Period Weeks    Status New      PT LONG TERM GOAL #3   Title Increase right hip and knee strength to a solid 5/5 to provide good stability for accomplishment of functional activities.    Time 6    Period Weeks    Status New      PT LONG TERM GOAL #4   Title Perform ADL's with pain not > 2/10.    Time 6    Period Weeks    Status New       PT LONG TERM GOAL #5   Title Perform a reciprocating stair gait with one railing with pain not > 2-3/10.    Time 6    Period Weeks    Status New                   Plan - 09/09/20 1248     Clinical Impression Statement Patient is doing exceptionally well.  He was able to perform forward revolutions on recumbent bike progressing to seat 6 today.  he reports his knee pops and clicks at times but reports no pain.    Personal Factors and Comorbidities Comorbidity 1;Other    Comorbidities DM, HTN, Hernia repair.    Examination-Activity Limitations Other;Locomotion Level    Examination-Participation Restrictions Other    Stability/Clinical Decision Making Stable/Uncomplicated    Rehab Potential Excellent    PT Frequency 2x / week    PT Duration 6 weeks    PT Treatment/Interventions ADLs/Self Care Home Management;Cryotherapy;Electrical Stimulation;Moist Heat;Gait training;Stair training;Functional mobility training;Therapeutic activities;Therapeutic exercise;Manual techniques;Patient/family education;Passive range of motion;Vasopneumatic Device    PT Next Visit Plan Recumbent bike.  Progress per TKA protocol.  PRE's.  Vasopneumatic.    Consulted and Agree with Plan of Care Patient             Patient will benefit from skilled therapeutic intervention in order to improve the following deficits and impairments:  Pain, Abnormal gait, Decreased range of motion, Decreased strength, Increased edema  Visit Diagnosis: Chronic pain of right knee  Localized edema  Stiffness of right knee, not elsewhere classified     Problem List Patient Active Problem List   Diagnosis Date Noted   S/P TKR (total knee replacement), right 08/16/2020   Primary osteoarthritis of right knee    Erectile dysfunction following radical prostatectomy 08/18/2013    [name], [name] MPT 09/09/2020, 12:51 PM  [facility] [address] [place], [place],  [address] Phone: [phone]   Fax:  [phone]  Name: Robert Bishop MRN: [id number] Date of Birth: [birth year]

## 2020-09-12 ENCOUNTER — Ambulatory Visit: Payer: Medicare Other | Admitting: Physical Therapy

## 2020-09-12 ENCOUNTER — Other Ambulatory Visit: Payer: Self-pay

## 2020-09-12 DIAGNOSIS — R600 Localized edema: Secondary | ICD-10-CM | POA: Diagnosis not present

## 2020-09-12 DIAGNOSIS — M25661 Stiffness of right knee, not elsewhere classified: Secondary | ICD-10-CM | POA: Diagnosis not present

## 2020-09-12 DIAGNOSIS — M25561 Pain in right knee: Secondary | ICD-10-CM

## 2020-09-12 DIAGNOSIS — G8929 Other chronic pain: Secondary | ICD-10-CM | POA: Diagnosis not present

## 2020-09-12 NOTE — Therapy (Signed)
[facility] [place], [place], [address] Phone: [phone]   Fax:  [phone]  Physical Therapy Treatment  Patient Details  Name: Peyten Weare MRN: [id number] Date of Birth: [birth year] Referring Provider (PT): Arther Abbott MD   Encounter Date: 09/12/2020   PT End of Session - 09/12/20 0938     Visit Number 4    Number of Visits 12    Date for PT Re-Evaluation 12/04/20    Authorization Type FOTO AT LEAST EVERY 5TH VISIT.  PROGRESS NOTE AT 10TH VISIT.  KX MODIFIER AFTER 15 VISITS.    PT Start Time 0900    PT Stop Time 0953    PT Time Calculation (min) 53 min    Activity Tolerance Patient tolerated treatment well    Behavior During Therapy WFL for tasks assessed/performed             Past Medical History:  Diagnosis Date   Anemia    Diabetes mellitus without complication (HCC)    GERD (gastroesophageal reflux disease)    pt states no problem since hernia repair   H/O hiatal hernia    Hematuria    History of blood transfusion yrs ago   Hypertension    Lesion of bladder    Wears glasses     Past Surgical History:  Procedure Laterality Date   CYSTOSCOPY N/A 08/10/2014   Procedure: CYSTOSCOPY FLEXIBLE AND REMOVAL OF FOREIGN BODY ( Vascular Clip);  Surgeon: Irine Seal, MD;  Location: [facility];  Service: Urology;  Laterality: N/A;   CYSTOSCOPY WITH BIOPSY N/A 06/12/2012   Procedure: CYSTOSCOPY BLADDER BIOPSY WITH FULGURATION;  Surgeon: Malka So, MD;  Location: [facility];  Service: Urology;  Laterality: N/A;   HERNIA REPAIR     hiatal   PENILE PROSTHESIS IMPLANT N/A 08/18/2013   Procedure: INSERTION OF COLOPLAST TITAN PENILE PROSTHESIS INFRAPUBIC APPROACH;  Surgeon: Malka So, MD;  Location: [facility];  Service: Urology;  Laterality: N/A;   ROBOT ASSISTED LAPAROSCOPIC RADICAL PROSTATECTOMY N/A 08/06/2012   Procedure: ROBOTIC ASSISTED LAPAROSCOPIC RADICAL PROSTATECTOMY WITH NODE DISSECTION AND  LEFT NERVE SPARE ONLY;  Surgeon: Malka So, MD;  Location: [facility];  Service: Urology;  Laterality: N/A;   TOTAL KNEE ARTHROPLASTY Right 08/16/2020   Procedure: RIGHT TOTAL KNEE ARTHROPLASTY;  Surgeon: Carole Civil, MD;  Location: [facility];  Service: Orthopedics;  Laterality: Right;    There were no vitals filed for this visit.   Subjective Assessment - 09/12/20 0939     Subjective COVID-19 screen performed prior to patient entering clinic.  Doing good.    Pertinent History DM, HTN, Hernia repair.    Patient Stated Goals Get back to normal.    Currently in Pain? No/denies    Pain Orientation Right                               [facility] Adult PT Treatment/Exercise - 09/12/20 0001       Exercises   Exercises Knee/Hip      Knee/Hip Exercises: Aerobic   Recumbent Bike Level 3 x 15 minutes progressing to seat 5 today.      Knee/Hip Exercises: Supine   Short Arc Quad Sets Limitations 16 minutes facilitated with VMS to right quadriceps with 10 sec extension holds f/b a 10 sec rest with 5#.      Modalities   Modalities Vasopneumatic      Vasopneumatic  Number Minutes Vasopneumatic  15 minutes    Vasopnuematic Location  --   Right knee.                        PT Long Term Goals - 09/05/20 1150       PT LONG TERM GOAL #1   Title Independent with a HEP.    Time 6    Period Weeks    Status New      PT LONG TERM GOAL #2   Title Right knee active flexion to 125 degrees.    Time 6    Period Weeks    Status New      PT LONG TERM GOAL #3   Title Increase right hip and knee strength to a solid 5/5 to provide good stability for accomplishment of functional activities.    Time 6    Period Weeks    Status New      PT LONG TERM GOAL #4   Title Perform ADL's with pain not > 2/10.    Time 6    Period Weeks    Status New      PT LONG TERM GOAL #5   Title Perform a reciprocating stair gait with one railing with pain not > 2-3/10.    Time  6    Period Weeks    Status New                   Plan - 09/12/20 0941     Clinical Impression Statement Excellent progress with progression to seat 5 on the recumbent bike and 5# SAQ's facilitated with VMS to right quadriceps.    Personal Factors and Comorbidities Comorbidity 1;Other    Comorbidities DM, HTN, Hernia repair.    Examination-Participation Restrictions Other    Stability/Clinical Decision Making Stable/Uncomplicated    Rehab Potential Excellent    PT Duration 6 weeks    PT Treatment/Interventions ADLs/Self Care Home Management;Cryotherapy;Electrical Stimulation;Moist Heat;Gait training;Stair training;Functional mobility training;Therapeutic activities;Therapeutic exercise;Manual techniques;Patient/family education;Passive range of motion;Vasopneumatic Device    PT Next Visit Plan Progress to seat 4 on recumbent bike and begin weight machines for knee extension, hamstring curls and leg press.    Consulted and Agree with Plan of Care Patient             Patient will benefit from skilled therapeutic intervention in order to improve the following deficits and impairments:  Pain, Abnormal gait, Decreased range of motion, Decreased strength, Increased edema  Visit Diagnosis: Localized edema  Chronic pain of right knee     Problem List Patient Active Problem List   Diagnosis Date Noted   S/P TKR (total knee replacement), right 08/16/2020   Primary osteoarthritis of right knee    Erectile dysfunction following radical prostatectomy 08/18/2013    Honora Searson, [name] MPT 09/12/2020, 11:49 AM  [facility] [address] [place], [place], [address] Phone: [phone]   Fax:  [phone]  Name: Lionell Matuszak MRN: [id number] Date of Birth: Aug 03, 1945

## 2020-09-14 ENCOUNTER — Ambulatory Visit: Payer: Medicare Other | Admitting: Physical Therapy

## 2020-09-14 ENCOUNTER — Other Ambulatory Visit: Payer: Self-pay

## 2020-09-14 ENCOUNTER — Encounter: Payer: Self-pay | Admitting: Physical Therapy

## 2020-09-14 DIAGNOSIS — M25661 Stiffness of right knee, not elsewhere classified: Secondary | ICD-10-CM | POA: Diagnosis not present

## 2020-09-14 DIAGNOSIS — R600 Localized edema: Secondary | ICD-10-CM

## 2020-09-14 DIAGNOSIS — G8929 Other chronic pain: Secondary | ICD-10-CM | POA: Diagnosis not present

## 2020-09-14 DIAGNOSIS — M25561 Pain in right knee: Secondary | ICD-10-CM

## 2020-09-14 NOTE — Therapy (Signed)
[facility] [place], [place], [address] Phone: [phone]   Fax:  [phone]  Physical Therapy Treatment  Patient Details  Name: Robert Bishop MRN: [id number] Date of Birth: [birth year] Referring Provider (PT): Arther Abbott MD   Encounter Date: 09/14/2020   PT End of Session - 09/14/20 0903     Visit Number 5    Number of Visits 12    Date for PT Re-Evaluation 12/04/20    Authorization Type FOTO AT LEAST EVERY 5TH VISIT.  PROGRESS NOTE AT 10TH VISIT.  KX MODIFIER AFTER 15 VISITS.    PT Start Time 0900    PT Stop Time 0944    PT Time Calculation (min) 44 min    Activity Tolerance Patient tolerated treatment well    Behavior During Therapy WFL for tasks assessed/performed             Past Medical History:  Diagnosis Date   Anemia    Diabetes mellitus without complication (HCC)    GERD (gastroesophageal reflux disease)    pt states no problem since hernia repair   H/O hiatal hernia    Hematuria    History of blood transfusion yrs ago   Hypertension    Lesion of bladder    Wears glasses     Past Surgical History:  Procedure Laterality Date   CYSTOSCOPY N/A 08/10/2014   Procedure: CYSTOSCOPY FLEXIBLE AND REMOVAL OF FOREIGN BODY ( Vascular Clip);  Surgeon: Irine Seal, MD;  Location: [facility];  Service: Urology;  Laterality: N/A;   CYSTOSCOPY WITH BIOPSY N/A 06/12/2012   Procedure: CYSTOSCOPY BLADDER BIOPSY WITH FULGURATION;  Surgeon: Malka So, MD;  Location: [facility];  Service: Urology;  Laterality: N/A;   HERNIA REPAIR     hiatal   PENILE PROSTHESIS IMPLANT N/A 08/18/2013   Procedure: INSERTION OF COLOPLAST TITAN PENILE PROSTHESIS INFRAPUBIC APPROACH;  Surgeon: Malka So, MD;  Location: [facility];  Service: Urology;  Laterality: N/A;   ROBOT ASSISTED LAPAROSCOPIC RADICAL PROSTATECTOMY N/A 08/06/2012   Procedure: ROBOTIC ASSISTED LAPAROSCOPIC RADICAL PROSTATECTOMY WITH NODE DISSECTION AND  LEFT NERVE SPARE ONLY;  Surgeon: Malka So, MD;  Location: [facility];  Service: Urology;  Laterality: N/A;   TOTAL KNEE ARTHROPLASTY Right 08/16/2020   Procedure: RIGHT TOTAL KNEE ARTHROPLASTY;  Surgeon: Carole Civil, MD;  Location: [facility];  Service: Orthopedics;  Laterality: Right;    There were no vitals filed for this visit.   Subjective Assessment - 09/14/20 0901     Subjective COVID-19 screen performed prior to patient entering clinic. Reports an intermittant electrical sensation in R knee. Also reports some discomfort along posterior knee and calf region.    Pertinent History DM, HTN, Hernia repair.    Patient Stated Goals Get back to normal.    Currently in Pain? No/denies                [facility] PT Assessment - 09/14/20 0001       Assessment   Medical Diagnosis Right total knee replacement.    Referring Provider (PT) Arther Abbott MD    Onset Date/Surgical Date 08/16/20    Next MD Visit 09/28/2020      Precautions   Precaution Comments No ultrasound.      Restrictions   Weight Bearing Restrictions No      ROM / Strength   AROM / PROM / Strength AROM      AROM   Overall AROM  Within functional  limits for tasks performed    AROM Assessment Site Knee    Right/Left Knee Right    Right Knee Extension 0    Right Knee Flexion 130                           [facility] Adult PT Treatment/Exercise - 09/14/20 0001       Knee/Hip Exercises: Aerobic   Recumbent Bike Level 3 x 15 minutes progressing to seat 5 today.      Knee/Hip Exercises: Machines for Strengthening   Cybex Knee Extension 10# 3x10 reps    Cybex Knee Flexion 30# 3x10 reps      Knee/Hip Exercises: Standing   Heel Raises Both;15 reps    Hip Abduction Stengthening;Right;15 reps;Knee straight    Step Down Right;15 reps;Hand Hold: 2;Step Height: 4"    Rocker Board 3 minutes      Knee/Hip Exercises: Supine   Straight Leg Raises Strengthening;Right;2 sets;10 reps      Modalities    Modalities Vasopneumatic      Vasopneumatic   Number Minutes Vasopneumatic  10 minutes    Vasopnuematic Location  Knee    Vasopneumatic Pressure Medium    Vasopneumatic Temperature  34/edema                         PT Long Term Goals - 09/14/20 7622       PT LONG TERM GOAL #1   Title Independent with a HEP.    Time 6    Period Weeks    Status On-going      PT LONG TERM GOAL #2   Title Right knee active flexion to 125 degrees.    Time 6    Period Weeks    Status Achieved      PT LONG TERM GOAL #3   Title Increase right hip and knee strength to a solid 5/5 to provide good stability for accomplishment of functional activities.    Time 6    Period Weeks    Status On-going      PT LONG TERM GOAL #4   Title Perform ADL's with pain not > 2/10.    Time 6    Period Weeks    Status Achieved      PT LONG TERM GOAL #5   Title Perform a reciprocating stair gait with one railing with pain not > 2-3/10.    Time 6    Period Weeks    Status On-going                   Plan - 09/14/20 6333     Clinical Impression Statement Patient progressed today to more end range knee extension as well as machine strengthening. Patient educated regarding slow pace of reps for eccentric strengthening as well. Patient fatigued with SLR with very minimal extensor. AROM of R knee measured as 0-130 deg. Normal vasopneumatic response noted following removal of the modality. Patient does have minimal edema of R knee.    Personal Factors and Comorbidities Comorbidity 1;Other    Comorbidities DM, HTN, Hernia repair.    Examination-Activity Limitations Other;Locomotion Level    Examination-Participation Restrictions Other    Stability/Clinical Decision Making Stable/Uncomplicated    Rehab Potential Excellent    PT Frequency 2x / week    PT Duration 6 weeks    PT Treatment/Interventions ADLs/Self Care Home Management;Cryotherapy;Electrical Stimulation;Moist Heat;Gait training;Stair  training;Functional mobility training;Therapeutic activities;Therapeutic  exercise;Manual techniques;Patient/family education;Passive range of motion;Vasopneumatic Device    PT Next Visit Plan Progress to seat 4 on recumbent bike and begin weight machines for knee extension, hamstring curls and leg press.    Consulted and Agree with Plan of Care Patient             Patient will benefit from skilled therapeutic intervention in order to improve the following deficits and impairments:  Pain, Abnormal gait, Decreased range of motion, Decreased strength, Increased edema  Visit Diagnosis: Localized edema  Chronic pain of right knee  Stiffness of right knee, not elsewhere classified     Problem List Patient Active Problem List   Diagnosis Date Noted   S/P TKR (total knee replacement), right 08/16/2020   Primary osteoarthritis of right knee    Erectile dysfunction following radical prostatectomy 08/18/2013    Standley Brooking, PTA 09/14/2020, 9:49 AM  [facility] [address] [place], [place], [address] Phone: [phone]   Fax:  [phone]  Name: Robert Bishop MRN: [id number] Date of Birth: 02/04/1946

## 2020-09-15 DIAGNOSIS — K219 Gastro-esophageal reflux disease without esophagitis: Secondary | ICD-10-CM | POA: Diagnosis not present

## 2020-09-15 DIAGNOSIS — E559 Vitamin D deficiency, unspecified: Secondary | ICD-10-CM | POA: Diagnosis not present

## 2020-09-15 DIAGNOSIS — E119 Type 2 diabetes mellitus without complications: Secondary | ICD-10-CM | POA: Diagnosis not present

## 2020-09-15 DIAGNOSIS — E7849 Other hyperlipidemia: Secondary | ICD-10-CM | POA: Diagnosis not present

## 2020-09-15 DIAGNOSIS — E782 Mixed hyperlipidemia: Secondary | ICD-10-CM | POA: Diagnosis not present

## 2020-09-19 ENCOUNTER — Encounter: Payer: Self-pay | Admitting: Orthopedic Surgery

## 2020-09-19 ENCOUNTER — Ambulatory Visit: Payer: Medicare Other | Admitting: Physical Therapy

## 2020-09-19 ENCOUNTER — Telehealth: Payer: Self-pay | Admitting: Orthopedic Surgery

## 2020-09-19 ENCOUNTER — Ambulatory Visit (INDEPENDENT_AMBULATORY_CARE_PROVIDER_SITE_OTHER): Payer: Medicare Other | Admitting: Orthopedic Surgery

## 2020-09-19 ENCOUNTER — Encounter: Payer: Self-pay | Admitting: Orthopaedic Surgery

## 2020-09-19 ENCOUNTER — Other Ambulatory Visit: Payer: Self-pay

## 2020-09-19 DIAGNOSIS — R600 Localized edema: Secondary | ICD-10-CM | POA: Diagnosis not present

## 2020-09-19 DIAGNOSIS — M25561 Pain in right knee: Secondary | ICD-10-CM | POA: Diagnosis not present

## 2020-09-19 DIAGNOSIS — M25661 Stiffness of right knee, not elsewhere classified: Secondary | ICD-10-CM | POA: Diagnosis not present

## 2020-09-19 DIAGNOSIS — G8929 Other chronic pain: Secondary | ICD-10-CM | POA: Diagnosis not present

## 2020-09-19 DIAGNOSIS — Z96651 Presence of right artificial knee joint: Secondary | ICD-10-CM

## 2020-09-19 NOTE — Telephone Encounter (Signed)
Patient called to relay that he has a bruised area on his right leg, same side as where total knee replacement was done 08/16/20. Michela Pitcher he is concerned - given appointment for today for Dr Aline Brochure; aware.

## 2020-09-19 NOTE — Progress Notes (Signed)
  Chief Complaint  Patient presents with   Post-op Follow-up    08/16/20 has bruise on inner thigh right/s/p knee replaced    75 year old male noticed a bruise on his right thigh  He says that he has not had any injury.  The bruises noted do not think it is from the tourniquet is too far out I did advise him to stop aspirin and we will see him at scheduled visit in a week

## 2020-09-19 NOTE — Therapy (Signed)
[facility] [place], [place], [address] Phone: [phone]   Fax:  [phone]  Physical Therapy Treatment  Patient Details  Name: Robert Bishop MRN: [id number] Date of Birth: [birth year] Referring Provider (PT): Arther Abbott MD   Encounter Date: 09/19/2020   PT End of Session - 09/19/20 0913     Visit Number 6    Number of Visits 12    Date for PT Re-Evaluation 12/04/20    Authorization Type FOTO 6th visit score 56  PROGRESS NOTE AT 10TH VISIT.  KX MODIFIER AFTER 15 VISITS.    PT Start Time 0900    PT Stop Time 0955    PT Time Calculation (min) 55 min    Activity Tolerance Patient tolerated treatment well    Behavior During Therapy WFL for tasks assessed/performed             Past Medical History:  Diagnosis Date   Anemia    Diabetes mellitus without complication (HCC)    GERD (gastroesophageal reflux disease)    pt states no problem since hernia repair   H/O hiatal hernia    Hematuria    History of blood transfusion yrs ago   Hypertension    Lesion of bladder    Wears glasses     Past Surgical History:  Procedure Laterality Date   CYSTOSCOPY N/A 08/10/2014   Procedure: CYSTOSCOPY FLEXIBLE AND REMOVAL OF FOREIGN BODY ( Vascular Clip);  Surgeon: Irine Seal, MD;  Location: [facility];  Service: Urology;  Laterality: N/A;   CYSTOSCOPY WITH BIOPSY N/A 06/12/2012   Procedure: CYSTOSCOPY BLADDER BIOPSY WITH FULGURATION;  Surgeon: Malka So, MD;  Location: [facility];  Service: Urology;  Laterality: N/A;   HERNIA REPAIR     hiatal   PENILE PROSTHESIS IMPLANT N/A 08/18/2013   Procedure: INSERTION OF COLOPLAST TITAN PENILE PROSTHESIS INFRAPUBIC APPROACH;  Surgeon: Malka So, MD;  Location: [facility];  Service: Urology;  Laterality: N/A;   ROBOT ASSISTED LAPAROSCOPIC RADICAL PROSTATECTOMY N/A 08/06/2012   Procedure: ROBOTIC ASSISTED LAPAROSCOPIC RADICAL PROSTATECTOMY WITH NODE DISSECTION AND LEFT  NERVE SPARE ONLY;  Surgeon: Malka So, MD;  Location: [facility];  Service: Urology;  Laterality: N/A;   TOTAL KNEE ARTHROPLASTY Right 08/16/2020   Procedure: RIGHT TOTAL KNEE ARTHROPLASTY;  Surgeon: Carole Civil, MD;  Location: [facility];  Service: Orthopedics;  Laterality: Right;    There were no vitals filed for this visit.   Subjective Assessment - 09/19/20 0911     Subjective COVID-19 screen performed prior to patient entering clinic. Patient arrived with new bruising on medial thigh for unknown reason, PT assesed and palpated calf with no findings. Patient to contact MD today.    Pertinent History DM, HTN, Hernia repair.    Patient Stated Goals Get back to normal.    Currently in Pain? No/denies                               [facility] Adult PT Treatment/Exercise - 09/19/20 0001       Knee/Hip Exercises: Aerobic   Recumbent Bike Level 3 x 15 minutes progressing to seat 5 today.      Knee/Hip Exercises: Machines for Strengthening   Cybex Knee Extension 10# 3x10 reps    Cybex Knee Flexion 30# 3x10 reps      Knee/Hip Exercises: Standing   Forward Step Up Right;2 sets;10 reps;Step Height: 6"  Step Down Right;2 sets;10 reps;Step Height: 4"    Rocker Board 3 minutes    Other Standing Knee Exercises lateral stepping with yellow band on ankles x fatigue      Vasopneumatic   Number Minutes Vasopneumatic  15 minutes    Vasopnuematic Location  Knee    Vasopneumatic Pressure Medium    Vasopneumatic Temperature  34/edema                         PT Long Term Goals - 09/19/20 0915       PT LONG TERM GOAL #1   Title Independent with a HEP.    Time 6    Period Weeks    Status On-going      PT LONG TERM GOAL #2   Title Right knee active flexion to 125 degrees.    Time 6    Period Weeks    Status Achieved      PT LONG TERM GOAL #3   Title Increase right hip and knee strength to a solid 5/5 to provide good stability for accomplishment of  functional activities.    Time 6    Period Weeks    Status On-going      PT LONG TERM GOAL #4   Title Perform ADL's with pain not > 2/10.    Time 6    Period Weeks    Status Achieved      PT LONG TERM GOAL #5   Title Perform a reciprocating stair gait with one railing with pain not > 2-3/10.    Time 6    Period Weeks    Status On-going                   Plan - 09/19/20 0935     Clinical Impression Statement Patient tolerated treatment well today. Patient able to progress with rigght LE strengthening today with no pain. Patient able to perform light ADL's with greater ease. Patient has not returned to heavy activities or back to walking routine yet is doing better each week. Patient current goals progressing.    Personal Factors and Comorbidities Comorbidity 1;Other    Comorbidities DM, HTN, Hernia repair.    Examination-Activity Limitations Other;Locomotion Level    Examination-Participation Restrictions Other    Stability/Clinical Decision Making Stable/Uncomplicated    Rehab Potential Excellent    PT Frequency 2x / week    PT Duration 6 weeks    PT Treatment/Interventions ADLs/Self Care Home Management;Cryotherapy;Electrical Stimulation;Moist Heat;Gait training;Stair training;Functional mobility training;Therapeutic activities;Therapeutic exercise;Manual techniques;Patient/family education;Passive range of motion;Vasopneumatic Device    PT Next Visit Plan cont to Progress to seat 4 on recumbent bike and begin weight machines for knee extension, hamstring curls and leg press.    Consulted and Agree with Plan of Care Patient             Patient will benefit from skilled therapeutic intervention in order to improve the following deficits and impairments:  Pain, Abnormal gait, Decreased range of motion, Decreased strength, Increased edema  Visit Diagnosis: Localized edema  Chronic pain of right knee  Stiffness of right knee, not elsewhere  classified     Problem List Patient Active Problem List   Diagnosis Date Noted   S/P TKR (total knee replacement), right 08/16/2020   Primary osteoarthritis of right knee    Erectile dysfunction following radical prostatectomy 08/18/2013    [place] [facility], Jerusalem Brownstein P 09/19/2020, 1:15 PM  [facility] [address]  [place], [place], [address] Phone: [phone]   Fax:  [phone]  Name: Robert Bishop MRN: [id number] Date of Birth: [birth year]

## 2020-09-19 NOTE — Patient Instructions (Signed)
Stop taking aspirin

## 2020-09-19 NOTE — Therapy (Signed)
[facility] [place], [place], [address] Phone: [phone]   Fax:  [phone]  Physical Therapy Treatment  Patient Details  Name: Robert Bishop MRN: [id number] Date of Birth: [birth year] Referring Provider (PT): Arther Abbott MD   Encounter Date: 09/19/2020   PT End of Session - 09/19/20 0913     Visit Number 6    Number of Visits 12    Date for PT Re-Evaluation 12/04/20    Authorization Type FOTO 6th visit score 20  PROGRESS NOTE AT 10TH VISIT.  KX MODIFIER AFTER 15 VISITS.    PT Start Time 0900    PT Stop Time 0955    PT Time Calculation (min) 55 min    Activity Tolerance Patient tolerated treatment well    Behavior During Therapy WFL for tasks assessed/performed             Past Medical History:  Diagnosis Date   Anemia    Diabetes mellitus without complication (HCC)    GERD (gastroesophageal reflux disease)    pt states no problem since hernia repair   H/O hiatal hernia    Hematuria    History of blood transfusion yrs ago   Hypertension    Lesion of bladder    Wears glasses     Past Surgical History:  Procedure Laterality Date   CYSTOSCOPY N/A 08/10/2014   Procedure: CYSTOSCOPY FLEXIBLE AND REMOVAL OF FOREIGN BODY ( Vascular Clip);  Surgeon: Irine Seal, MD;  Location: [facility];  Service: Urology;  Laterality: N/A;   CYSTOSCOPY WITH BIOPSY N/A 06/12/2012   Procedure: CYSTOSCOPY BLADDER BIOPSY WITH FULGURATION;  Surgeon: Malka So, MD;  Location: [facility];  Service: Urology;  Laterality: N/A;   HERNIA REPAIR     hiatal   PENILE PROSTHESIS IMPLANT N/A 08/18/2013   Procedure: INSERTION OF COLOPLAST TITAN PENILE PROSTHESIS INFRAPUBIC APPROACH;  Surgeon: Malka So, MD;  Location: [facility];  Service: Urology;  Laterality: N/A;   ROBOT ASSISTED LAPAROSCOPIC RADICAL PROSTATECTOMY N/A 08/06/2012   Procedure: ROBOTIC ASSISTED LAPAROSCOPIC RADICAL PROSTATECTOMY WITH NODE DISSECTION AND LEFT  NERVE SPARE ONLY;  Surgeon: Malka So, MD;  Location: [facility];  Service: Urology;  Laterality: N/A;   TOTAL KNEE ARTHROPLASTY Right 08/16/2020   Procedure: RIGHT TOTAL KNEE ARTHROPLASTY;  Surgeon: Carole Civil, MD;  Location: [facility];  Service: Orthopedics;  Laterality: Right;    There were no vitals filed for this visit.   Subjective Assessment - 09/19/20 0911     Subjective COVID-19 screen performed prior to patient entering clinic. Patient arrived with new bruising on ant thigh for unknown reason, PT assesed and palpated calf with no findings. Patient to contact MD today.    Pertinent History DM, HTN, Hernia repair.    Patient Stated Goals Get back to normal.    Currently in Pain? No/denies                               [facility] Adult PT Treatment/Exercise - 09/19/20 0001       Knee/Hip Exercises: Aerobic   Recumbent Bike Level 3 x 15 minutes progressing to seat 5 today.      Knee/Hip Exercises: Machines for Strengthening   Cybex Knee Extension 10# 3x10 reps    Cybex Knee Flexion 30# 3x10 reps      Knee/Hip Exercises: Standing   Forward Step Up Right;2 sets;10 reps;Step Height: 6"  Step Down Right;2 sets;10 reps;Step Height: 4"    Rocker Board 3 minutes    Other Standing Knee Exercises lateral stepping with yellow band on ankles x fatigue      Vasopneumatic   Number Minutes Vasopneumatic  10 minutes    Vasopnuematic Location  Knee    Vasopneumatic Pressure Medium    Vasopneumatic Temperature  34/edema                         PT Long Term Goals - 09/19/20 0915       PT LONG TERM GOAL #1   Title Independent with a HEP.    Time 6    Period Weeks    Status On-going      PT LONG TERM GOAL #2   Title Right knee active flexion to 125 degrees.    Time 6    Period Weeks    Status Achieved      PT LONG TERM GOAL #3   Title Increase right hip and knee strength to a solid 5/5 to provide good stability for accomplishment of  functional activities.    Time 6    Period Weeks    Status On-going      PT LONG TERM GOAL #4   Title Perform ADL's with pain not > 2/10.    Time 6    Period Weeks    Status Achieved      PT LONG TERM GOAL #5   Title Perform a reciprocating stair gait with one railing with pain not > 2-3/10.    Time 6    Period Weeks    Status On-going                   Plan - 09/19/20 0935     Clinical Impression Statement Patient tolerated treatment well today. Patient able to progress with rigght LE strengthening today with no pain. Patient able to perform light ADL's with greater ease. Patient has not returned to heavy activities or back to walking routine yet is doing better each week. Patient current goals progressing.    Personal Factors and Comorbidities Comorbidity 1;Other    Comorbidities DM, HTN, Hernia repair.    Examination-Activity Limitations Other;Locomotion Level    Examination-Participation Restrictions Other    Stability/Clinical Decision Making Stable/Uncomplicated    Rehab Potential Excellent    PT Frequency 2x / week    PT Duration 6 weeks    PT Treatment/Interventions ADLs/Self Care Home Management;Cryotherapy;Electrical Stimulation;Moist Heat;Gait training;Stair training;Functional mobility training;Therapeutic activities;Therapeutic exercise;Manual techniques;Patient/family education;Passive range of motion;Vasopneumatic Device    PT Next Visit Plan cont to Progress to seat 4 on recumbent bike and begin weight machines for knee extension, hamstring curls and leg press.    Consulted and Agree with Plan of Care Patient             Patient will benefit from skilled therapeutic intervention in order to improve the following deficits and impairments:  Pain, Abnormal gait, Decreased range of motion, Decreased strength, Increased edema  Visit Diagnosis: Localized edema  Chronic pain of right knee  Stiffness of right knee, not elsewhere  classified     Problem List Patient Active Problem List   Diagnosis Date Noted   S/P TKR (total knee replacement), right 08/16/2020   Primary osteoarthritis of right knee    Erectile dysfunction following radical prostatectomy 08/18/2013    Newman Waren P, PTA 09/19/2020, 9:59 AM  [facility]  [place], [place], [address] Phone: [phone]   Fax:  [phone]  Name: Robert Bishop MRN: [id number] Date of Birth: 03-01-1945

## 2020-09-20 ENCOUNTER — Ambulatory Visit: Payer: Medicare Other | Admitting: Physical Therapy

## 2020-09-20 DIAGNOSIS — E1169 Type 2 diabetes mellitus with other specified complication: Secondary | ICD-10-CM | POA: Diagnosis not present

## 2020-09-20 DIAGNOSIS — I10 Essential (primary) hypertension: Secondary | ICD-10-CM | POA: Diagnosis not present

## 2020-09-20 DIAGNOSIS — M1711 Unilateral primary osteoarthritis, right knee: Secondary | ICD-10-CM | POA: Diagnosis not present

## 2020-09-20 DIAGNOSIS — C61 Malignant neoplasm of prostate: Secondary | ICD-10-CM | POA: Diagnosis not present

## 2020-09-20 DIAGNOSIS — Z96651 Presence of right artificial knee joint: Secondary | ICD-10-CM | POA: Diagnosis not present

## 2020-09-20 DIAGNOSIS — Z6830 Body mass index (BMI) 30.0-30.9, adult: Secondary | ICD-10-CM | POA: Diagnosis not present

## 2020-09-20 DIAGNOSIS — E7849 Other hyperlipidemia: Secondary | ICD-10-CM | POA: Diagnosis not present

## 2020-09-22 ENCOUNTER — Encounter: Payer: Medicare Other | Admitting: Physical Therapy

## 2020-09-23 ENCOUNTER — Other Ambulatory Visit: Payer: Self-pay

## 2020-09-23 ENCOUNTER — Ambulatory Visit: Payer: Medicare Other

## 2020-09-25 DIAGNOSIS — E7849 Other hyperlipidemia: Secondary | ICD-10-CM | POA: Diagnosis not present

## 2020-09-25 DIAGNOSIS — E1169 Type 2 diabetes mellitus with other specified complication: Secondary | ICD-10-CM | POA: Diagnosis not present

## 2020-09-25 DIAGNOSIS — Z7984 Long term (current) use of oral hypoglycemic drugs: Secondary | ICD-10-CM | POA: Diagnosis not present

## 2020-09-25 DIAGNOSIS — K219 Gastro-esophageal reflux disease without esophagitis: Secondary | ICD-10-CM | POA: Diagnosis not present

## 2020-09-25 DIAGNOSIS — I10 Essential (primary) hypertension: Secondary | ICD-10-CM | POA: Diagnosis not present

## 2020-09-26 ENCOUNTER — Other Ambulatory Visit: Payer: Self-pay

## 2020-09-26 ENCOUNTER — Encounter: Payer: Self-pay | Admitting: Physical Therapy

## 2020-09-26 ENCOUNTER — Ambulatory Visit: Payer: Medicare Other | Attending: Orthopedic Surgery | Admitting: Physical Therapy

## 2020-09-26 DIAGNOSIS — G8929 Other chronic pain: Secondary | ICD-10-CM | POA: Insufficient documentation

## 2020-09-26 DIAGNOSIS — R600 Localized edema: Secondary | ICD-10-CM | POA: Insufficient documentation

## 2020-09-26 DIAGNOSIS — M25661 Stiffness of right knee, not elsewhere classified: Secondary | ICD-10-CM | POA: Insufficient documentation

## 2020-09-26 DIAGNOSIS — M5442 Lumbago with sciatica, left side: Secondary | ICD-10-CM | POA: Diagnosis not present

## 2020-09-26 DIAGNOSIS — M25561 Pain in right knee: Secondary | ICD-10-CM | POA: Diagnosis not present

## 2020-09-26 NOTE — Therapy (Signed)
[facility] [place], [place], [address] Phone: [phone]   Fax:  [phone]  Physical Therapy Treatment  Patient Details  Name: Robert Bishop MRN: [id number] Date of Birth: 1945/11/04 Referring Provider (PT): Arther Abbott MD   Encounter Date: 09/26/2020   PT End of Session - 09/26/20 0831     Visit Number 7    Number of Visits 12    Date for PT Re-Evaluation 12/04/20    Authorization Type FOTO 6th visit score 37  PROGRESS NOTE AT 10TH VISIT.  KX MODIFIER AFTER 15 VISITS.    PT Start Time [phone]   late start to treatment   PT Stop Time 0905    PT Time Calculation (min) 37 min    Activity Tolerance Patient tolerated treatment well    Behavior During Therapy WFL for tasks assessed/performed             Past Medical History:  Diagnosis Date   Anemia    Diabetes mellitus without complication (HCC)    GERD (gastroesophageal reflux disease)    pt states no problem since hernia repair   H/O hiatal hernia    Hematuria    History of blood transfusion yrs ago   Hypertension    Lesion of bladder    Wears glasses     Past Surgical History:  Procedure Laterality Date   CYSTOSCOPY N/A 08/10/2014   Procedure: CYSTOSCOPY FLEXIBLE AND REMOVAL OF FOREIGN BODY ( Vascular Clip);  Surgeon: Irine Seal, MD;  Location: [facility];  Service: Urology;  Laterality: N/A;   CYSTOSCOPY WITH BIOPSY N/A 06/12/2012   Procedure: CYSTOSCOPY BLADDER BIOPSY WITH FULGURATION;  Surgeon: Malka So, MD;  Location: [facility];  Service: Urology;  Laterality: N/A;   HERNIA REPAIR     hiatal   PENILE PROSTHESIS IMPLANT N/A 08/18/2013   Procedure: INSERTION OF COLOPLAST TITAN PENILE PROSTHESIS INFRAPUBIC APPROACH;  Surgeon: Malka So, MD;  Location: [facility];  Service: Urology;  Laterality: N/A;   ROBOT ASSISTED LAPAROSCOPIC RADICAL PROSTATECTOMY N/A 08/06/2012   Procedure: ROBOTIC ASSISTED LAPAROSCOPIC RADICAL PROSTATECTOMY WITH  NODE DISSECTION AND LEFT NERVE SPARE ONLY;  Surgeon: Malka So, MD;  Location: [facility];  Service: Urology;  Laterality: N/A;   TOTAL KNEE ARTHROPLASTY Right 08/16/2020   Procedure: RIGHT TOTAL KNEE ARTHROPLASTY;  Surgeon: Carole Civil, MD;  Location: [facility];  Service: Orthopedics;  Laterality: Right;    There were no vitals filed for this visit.   Subjective Assessment - 09/26/20 0831     Subjective COVID-19 screen performed prior to patient entering clinic. Was taken off aspiring and bruising went away.    Pertinent History DM, HTN, Hernia repair.    Patient Stated Goals Get back to normal.    Currently in Pain? No/denies                [facility] PT Assessment - 09/26/20 0001       Assessment   Medical Diagnosis Right total knee replacement.    Referring Provider (PT) Arther Abbott MD    Onset Date/Surgical Date 08/16/20    Next MD Visit 09/28/2020      Precautions   Precaution Comments No ultrasound.      Restrictions   Weight Bearing Restrictions No      Observation/Other Assessments   Focus on Therapeutic Outcomes (FOTO)  1% limitation 7th visit 09/26/2020      AROM   Overall AROM  Within functional limits for  tasks performed    AROM Assessment Site Knee    Right/Left Knee Right    Right Knee Extension 0    Right Knee Flexion 133                           [facility] Adult PT Treatment/Exercise - 09/26/20 0001       Ambulation/Gait   Stairs Yes    Stairs Assistance 7: Independent    Stair Management Technique One rail Left;Alternating pattern;Forwards    Number of Stairs 4   x1 RT   Height of Stairs 6.5      Knee/Hip Exercises: Aerobic   Recumbent Bike L4, seat 5 x10 min      Knee/Hip Exercises: Machines for Strengthening   Cybex Knee Extension 10# 3x10 reps    Cybex Knee Flexion 30# 3x10 reps    Cybex Leg Press 2 pl, seat 7 x20 reps      Knee/Hip Exercises: Standing   Lateral Step Up Right;2 sets;10 reps;Hand Hold: 2;Step Height: 4"     Step Down Right;2 sets;10 reps;Step Height: 4"      Modalities   Modalities Vasopneumatic      Vasopneumatic   Number Minutes Vasopneumatic  10 minutes    Vasopnuematic Location  Knee    Vasopneumatic Pressure Medium    Vasopneumatic Temperature  34/edema                         PT Long Term Goals - 09/26/20 0855       PT LONG TERM GOAL #1   Title Independent with a HEP.    Time 6    Period Weeks    Status Unable to assess      PT LONG TERM GOAL #2   Title Right knee active flexion to 125 degrees.    Time 6    Period Weeks    Status Achieved      PT LONG TERM GOAL #3   Title Increase right hip and knee strength to a solid 5/5 to provide good stability for accomplishment of functional activities.    Time 6    Period Weeks    Status On-going      PT LONG TERM GOAL #4   Title Perform ADL's with pain not > 2/10.    Time 6    Period Weeks    Status Achieved      PT LONG TERM GOAL #5   Title Perform a reciprocating stair gait with one railing with pain not > 2-3/10.    Time 6    Period Weeks    Status Achieved                   Plan - 09/26/20 0932     Clinical Impression Statement Patient presented in clinic with no reports of any difficulty with ADLs or recreational activities. Patient progressed with machine strengthening and CKC quad/hip strengthening. Patient denied any pain with any therex but did report greater L nonoperative knee pain this weekend. Using [facility] at this time for gait but can walk independently. No limitations or pain with stair training. AROM of R knee measured as 0-133 deg. Normal vasopneumatic response noted following removal of the modality. Patient to begin recreational walking again soon in which he walks up to one mile a day.    Personal Factors and Comorbidities Comorbidity 1;Other    Comorbidities DM, HTN, Hernia  repair.    Examination-Activity Limitations Other;Locomotion Level    Examination-Participation  Restrictions Other    Stability/Clinical Decision Making Stable/Uncomplicated    Rehab Potential Excellent    PT Frequency 2x / week    PT Duration 6 weeks    PT Treatment/Interventions ADLs/Self Care Home Management;Cryotherapy;Electrical Stimulation;Moist Heat;Gait training;Stair training;Functional mobility training;Therapeutic activities;Therapeutic exercise;Manual techniques;Patient/family education;Passive range of motion;Vasopneumatic Device    PT Next Visit Plan cont to Progress to seat 4 on recumbent bike and begin weight machines for knee extension, hamstring curls and leg press.    Consulted and Agree with Plan of Care Patient             Patient will benefit from skilled therapeutic intervention in order to improve the following deficits and impairments:  Pain, Abnormal gait, Decreased range of motion, Decreased strength, Increased edema  Visit Diagnosis: Localized edema  Chronic pain of right knee  Stiffness of right knee, not elsewhere classified     Problem List Patient Active Problem List   Diagnosis Date Noted   S/P TKR (total knee replacement), right 08/16/2020   Primary osteoarthritis of right knee    Erectile dysfunction following radical prostatectomy 08/18/2013   Standley Brooking, PTA 09/26/20 9:37 AM   [facility] [address] [place], [place], [address] Phone: [phone]   Fax:  [phone]  Name: Robert Bishop MRN: [id number] Date of Birth: 1945-03-17

## 2020-09-28 ENCOUNTER — Other Ambulatory Visit: Payer: Self-pay

## 2020-09-28 ENCOUNTER — Ambulatory Visit (INDEPENDENT_AMBULATORY_CARE_PROVIDER_SITE_OTHER): Payer: Medicare Other | Admitting: Orthopedic Surgery

## 2020-09-28 DIAGNOSIS — M1711 Unilateral primary osteoarthritis, right knee: Secondary | ICD-10-CM

## 2020-09-28 DIAGNOSIS — Z96651 Presence of right artificial knee joint: Secondary | ICD-10-CM

## 2020-09-28 DIAGNOSIS — M5432 Sciatica, left side: Secondary | ICD-10-CM

## 2020-09-28 MED ORDER — PREDNISONE 10 MG (48) PO TBPK: ORAL_TABLET | Freq: Every day | ORAL | 0 refills | Status: DC

## 2020-09-28 NOTE — Progress Notes (Signed)
Chief Complaint  Patient presents with   Routine Post Op    DOS 08/16/20/ pt says leg is doing great   75 year old male here for his postop visit status post right total knee  He is now 6 weeks postop approximately  He has no issues with his right knee at this time he is using a cane  His range of motion was 5-1 35 on measurement yesterday  He is complaining of pain in his left lower extremity starting at his hip radiating to his knee complains of an intermittent sharp burning pain like an electrical shock  Examination of the right knee shows full range of motion mild swelling slight flexion contracture.  The left side of his lower back is tender into the left buttock and cheek with normal range of motion and no swelling in his left knee   Encounter Diagnoses  Name Primary?   Sciatica, left side Yes   Primary osteoarthritis of right knee    Status post total right knee replacement August 16, 2020     Recommend steroid Dosepak  Follow-up in December for 77-monthcheckup  Meds ordered this encounter  Medications   predniSONE (STERAPRED UNI-PAK 48 TAB) 10 MG (48) TBPK tablet    Sig: Take by mouth daily. Ds 12 days as directed    Dispense:  48 tablet    Refill:  0   Acute uncomplicated prescription drug management

## 2020-09-29 ENCOUNTER — Ambulatory Visit: Payer: Medicare Other | Admitting: Physical Therapy

## 2020-09-29 DIAGNOSIS — R600 Localized edema: Secondary | ICD-10-CM | POA: Diagnosis not present

## 2020-09-29 DIAGNOSIS — G8929 Other chronic pain: Secondary | ICD-10-CM

## 2020-09-29 DIAGNOSIS — M5442 Lumbago with sciatica, left side: Secondary | ICD-10-CM | POA: Diagnosis not present

## 2020-09-29 DIAGNOSIS — M25661 Stiffness of right knee, not elsewhere classified: Secondary | ICD-10-CM | POA: Diagnosis not present

## 2020-09-29 DIAGNOSIS — M25561 Pain in right knee: Secondary | ICD-10-CM | POA: Diagnosis not present

## 2020-09-29 NOTE — Therapy (Signed)
[facility] [place], [place], [address] Phone: [phone]   Fax:  [phone]  Physical Therapy Treatment  Patient Details  Name: Robert Bishop MRN: [id number] Date of Birth: [birth year] Referring Provider (PT): Arther Abbott MD   Encounter Date: 09/29/2020   PT End of Session - 09/29/20 1287     Visit Number 8    Number of Visits 12    Date for PT Re-Evaluation 12/04/20    Authorization Type FOTO 6th visit score 76  PROGRESS NOTE AT 10TH VISIT.  KX MODIFIER AFTER 15 VISITS.    PT Start Time 0818    PT Stop Time 0905    PT Time Calculation (min) 47 min    Activity Tolerance Patient tolerated treatment well    Behavior During Therapy WFL for tasks assessed/performed             Past Medical History:  Diagnosis Date   Anemia    Diabetes mellitus without complication (HCC)    GERD (gastroesophageal reflux disease)    pt states no problem since hernia repair   H/O hiatal hernia    Hematuria    History of blood transfusion yrs ago   Hypertension    Lesion of bladder    Wears glasses     Past Surgical History:  Procedure Laterality Date   CYSTOSCOPY N/A 08/10/2014   Procedure: CYSTOSCOPY FLEXIBLE AND REMOVAL OF FOREIGN BODY ( Vascular Clip);  Surgeon: Irine Seal, MD;  Location: [facility];  Service: Urology;  Laterality: N/A;   CYSTOSCOPY WITH BIOPSY N/A 06/12/2012   Procedure: CYSTOSCOPY BLADDER BIOPSY WITH FULGURATION;  Surgeon: Malka So, MD;  Location: [facility];  Service: Urology;  Laterality: N/A;   HERNIA REPAIR     hiatal   PENILE PROSTHESIS IMPLANT N/A 08/18/2013   Procedure: INSERTION OF COLOPLAST TITAN PENILE PROSTHESIS INFRAPUBIC APPROACH;  Surgeon: Malka So, MD;  Location: [facility];  Service: Urology;  Laterality: N/A;   ROBOT ASSISTED LAPAROSCOPIC RADICAL PROSTATECTOMY N/A 08/06/2012   Procedure: ROBOTIC ASSISTED LAPAROSCOPIC RADICAL PROSTATECTOMY WITH NODE DISSECTION AND LEFT  NERVE SPARE ONLY;  Surgeon: Malka So, MD;  Location: [facility];  Service: Urology;  Laterality: N/A;   TOTAL KNEE ARTHROPLASTY Right 08/16/2020   Procedure: RIGHT TOTAL KNEE ARTHROPLASTY;  Surgeon: Carole Civil, MD;  Location: [facility];  Service: Orthopedics;  Laterality: Right;    There were no vitals filed for this visit.   Subjective Assessment - 09/29/20 0820     Subjective COVID-19 screen performed prior to patient entering clinic. Patient arrived doing well today.    Pertinent History DM, HTN, Hernia repair.    Patient Stated Goals Get back to normal.    Currently in Pain? No/denies                               [facility] Adult PT Treatment/Exercise - 09/29/20 0001       Knee/Hip Exercises: Aerobic   Recumbent Bike L3 x15      Knee/Hip Exercises: Machines for Strengthening   Cybex Knee Extension 20# 3x10 reps    Cybex Knee Flexion 30# 3x10 reps    Cybex Leg Press 2 pl, seat 7 x20 reps      Knee/Hip Exercises: Standing   Terminal Knee Extension Strengthening;Right;20 reps;10 reps;Theraband    Theraband Level (Terminal Knee Extension) Level 3 (Green)      Knee/Hip Exercises: Seated  Sit to Sand without UE support;20 reps      Knee/Hip Exercises: Supine   Bridges with Clamshell Strengthening;Both;20 reps   with green band     Knee/Hip Exercises: Sidelying   Hip ABduction Strengthening;Right;20 reps      Vasopneumatic   Number Minutes Vasopneumatic  10 minutes    Vasopnuematic Location  Knee    Vasopneumatic Pressure Medium    Vasopneumatic Temperature  34/edema                    PT Education - 09/29/20 0839     Education Details HEP    Person(s) Educated Patient    Methods Explanation;Demonstration;Handout    Comprehension Verbalized understanding;Returned demonstration                 PT Long Term Goals - 09/29/20 0835       PT LONG TERM GOAL #1   Title Independent with a HEP.    Baseline issued today 09/29/20     Time 6    Status Achieved      PT LONG TERM GOAL #2   Title Right knee active flexion to 125 degrees.    Time 6    Period Weeks    Status Achieved      PT LONG TERM GOAL #3   Title Increase right hip and knee strength to a solid 5/5 to provide good stability for accomplishment of functional activities.    Time 6    Period Weeks    Status On-going      PT LONG TERM GOAL #4   Title Perform ADL's with pain not > 2/10.    Time 6    Period Weeks    Status Achieved      PT LONG TERM GOAL #5   Title Perform a reciprocating stair gait with one railing with pain not > 2-3/10.    Time 6    Period Weeks    Status Achieved                   Plan - 09/29/20 0849     Clinical Impression Statement Patient tolerated treatment well today. Patient progressing with all activities today. HEP provided for home progression. No pain reported and doing well with all ADL's. Patient has continued to progress and went to MD for his F/U yesterday with good report. Patient met  LTG#1 with strength goal progressing.    Personal Factors and Comorbidities Comorbidity 1;Other    Comorbidities DM, HTN, Hernia repair.    Examination-Activity Limitations Other;Locomotion Level    Examination-Participation Restrictions Other    Stability/Clinical Decision Making Stable/Uncomplicated    Rehab Potential Excellent    PT Frequency 2x / week    PT Duration 6 weeks    PT Treatment/Interventions ADLs/Self Care Home Management;Cryotherapy;Electrical Stimulation;Moist Heat;Gait training;Stair training;Functional mobility training;Therapeutic activities;Therapeutic exercise;Manual techniques;Patient/family education;Passive range of motion;Vasopneumatic Device    PT Next Visit Plan cont with strength progression    Consulted and Agree with Plan of Care Patient             Patient will benefit from skilled therapeutic intervention in order to improve the following deficits and impairments:  Pain, Abnormal  gait, Decreased range of motion, Decreased strength, Increased edema  Visit Diagnosis: Localized edema  Chronic pain of right knee  Stiffness of right knee, not elsewhere classified     Problem List Patient Active Problem List   Diagnosis Date Noted   S/P TKR (  total knee replacement), right 08/16/2020   Primary osteoarthritis of right knee    Erectile dysfunction following radical prostatectomy 08/18/2013    Phillips Climes, PTA 09/29/2020, 9:06 AM  [facility] [place], [place], [address] Phone: [phone]   Fax:  [phone]  Name: Orval Dortch MRN: [id number] Date of Birth: May 13, 1945

## 2020-09-29 NOTE — Patient Instructions (Signed)
   Half Squat to Chair   Stand with feet shoulder width apart. Push buttocks backward and lower slowly, sitting in chair lightly and returning to standing position. Complete _2_ sets of 10_ repetitions. Perform __2-3_ sessions per day.     Bridging with Theraband  Begin by lying on your back with your knees bent and theraband around your knees. Tighten your abs by tilting your pelvis up and flattening your back on the mat. Squeeze your glutes and raise your hips off of the table towards the ceiling. Keeping your hips raised, pull your knees outward against the band. Relax your knees back to midline and slowly return your hips to the mat. Relax and Breathe 2x10 1x daily   Strengthening: Hip Abduction (Side-Lying)   Tighten muscles on front of left thigh, then lift leg __5__ inches from surface, keeping knee locked.  Repeat __10__ times per set. Do __2__ sets per session. Do _2___ sessions per day.    Terminal Knee Extension (Standing)    Facing anchor with right knee slightly bent and tubing just above knee, gently pull knee back straight. Do not overextend knee. Repeat _10___ times per set. Do __1-3__ sets per session. Do _1-2___ sessions per day.

## 2020-10-03 ENCOUNTER — Ambulatory Visit: Payer: Medicare Other | Admitting: Physical Therapy

## 2020-10-03 ENCOUNTER — Other Ambulatory Visit: Payer: Self-pay

## 2020-10-03 ENCOUNTER — Encounter: Payer: Self-pay | Admitting: Physical Therapy

## 2020-10-03 DIAGNOSIS — R600 Localized edema: Secondary | ICD-10-CM

## 2020-10-03 DIAGNOSIS — M25561 Pain in right knee: Secondary | ICD-10-CM | POA: Diagnosis not present

## 2020-10-03 DIAGNOSIS — M5442 Lumbago with sciatica, left side: Secondary | ICD-10-CM | POA: Diagnosis not present

## 2020-10-03 DIAGNOSIS — G8929 Other chronic pain: Secondary | ICD-10-CM | POA: Diagnosis not present

## 2020-10-03 DIAGNOSIS — M25661 Stiffness of right knee, not elsewhere classified: Secondary | ICD-10-CM

## 2020-10-03 NOTE — Therapy (Signed)
[facility] [place], [place], [address] Phone: [phone]   Fax:  [phone]  Physical Therapy Treatment  Patient Details  Name: Robert Bishop MRN: [id number] Date of Birth: May 26, 1945 Referring Provider (PT): Arther Abbott MD   Encounter Date: 10/03/2020   PT End of Session - 10/03/20 0855     Visit Number 9    Number of Visits 12    Date for PT Re-Evaluation 12/04/20    Authorization Type FOTO 6th visit score 39  PROGRESS NOTE AT 10TH VISIT.  KX MODIFIER AFTER 15 VISITS.    PT Start Time 0815    PT Stop Time 0901    PT Time Calculation (min) 46 min    Activity Tolerance Patient tolerated treatment well    Behavior During Therapy WFL for tasks assessed/performed             Past Medical History:  Diagnosis Date   Anemia    Diabetes mellitus without complication (HCC)    GERD (gastroesophageal reflux disease)    pt states no problem since hernia repair   H/O hiatal hernia    Hematuria    History of blood transfusion yrs ago   Hypertension    Lesion of bladder    Wears glasses     Past Surgical History:  Procedure Laterality Date   CYSTOSCOPY N/A 08/10/2014   Procedure: CYSTOSCOPY FLEXIBLE AND REMOVAL OF FOREIGN BODY ( Vascular Clip);  Surgeon: Irine Seal, MD;  Location: [facility];  Service: Urology;  Laterality: N/A;   CYSTOSCOPY WITH BIOPSY N/A 06/12/2012   Procedure: CYSTOSCOPY BLADDER BIOPSY WITH FULGURATION;  Surgeon: Malka So, MD;  Location: [facility];  Service: Urology;  Laterality: N/A;   HERNIA REPAIR     hiatal   PENILE PROSTHESIS IMPLANT N/A 08/18/2013   Procedure: INSERTION OF COLOPLAST TITAN PENILE PROSTHESIS INFRAPUBIC APPROACH;  Surgeon: Malka So, MD;  Location: [facility];  Service: Urology;  Laterality: N/A;   ROBOT ASSISTED LAPAROSCOPIC RADICAL PROSTATECTOMY N/A 08/06/2012   Procedure: ROBOTIC ASSISTED LAPAROSCOPIC RADICAL PROSTATECTOMY WITH NODE DISSECTION AND LEFT  NERVE SPARE ONLY;  Surgeon: Malka So, MD;  Location: [facility];  Service: Urology;  Laterality: N/A;   TOTAL KNEE ARTHROPLASTY Right 08/16/2020   Procedure: RIGHT TOTAL KNEE ARTHROPLASTY;  Surgeon: Carole Civil, MD;  Location: [facility];  Service: Orthopedics;  Laterality: Right;    There were no vitals filed for this visit.                      [facility] Adult PT Treatment/Exercise - 10/03/20 0001       Knee/Hip Exercises: Aerobic   Recumbent Bike Level 3 x 15 minutes.      Knee/Hip Exercises: Machines for Strengthening   Cybex Knee Extension 20# x 3 minutes.    Cybex Knee Flexion 40# x 3 minutes    Cybex Leg Press 3 plates x 3 minutes.      Vasopneumatic   Number Minutes Vasopneumatic  15 minutes    Vasopnuematic Location  --   Right knee.   Vasopneumatic Pressure Low                         PT Long Term Goals - 09/29/20 0835       PT LONG TERM GOAL #1   Title Independent with a HEP.    Baseline issued today 09/29/20    Time 6  Status Achieved      PT LONG TERM GOAL #2   Title Right knee active flexion to 125 degrees.    Time 6    Period Weeks    Status Achieved      PT LONG TERM GOAL #3   Title Increase right hip and knee strength to a solid 5/5 to provide good stability for accomplishment of functional activities.    Time 6    Period Weeks    Status On-going      PT LONG TERM GOAL #4   Title Perform ADL's with pain not > 2/10.    Time 6    Period Weeks    Status Achieved      PT LONG TERM GOAL #5   Title Perform a reciprocating stair gait with one railing with pain not > 2-3/10.    Time 6    Period Weeks    Status Achieved                   Plan - 10/03/20 1027     Clinical Impression Statement Patient did great today.  No complaints with all ther ex.    Personal Factors and Comorbidities Comorbidity 1;Other    Comorbidities DM, HTN, Hernia repair.    Examination-Activity Limitations Other;Locomotion Level     Examination-Participation Restrictions Other    Stability/Clinical Decision Making Stable/Uncomplicated    Rehab Potential Excellent    PT Frequency 2x / week    PT Duration 6 weeks    PT Treatment/Interventions ADLs/Self Care Home Management;Cryotherapy;Electrical Stimulation;Moist Heat;Gait training;Stair training;Functional mobility training;Therapeutic activities;Therapeutic exercise;Manual techniques;Patient/family education;Passive range of motion;Vasopneumatic Device    Consulted and Agree with Plan of Care Patient             Patient will benefit from skilled therapeutic intervention in order to improve the following deficits and impairments:  Pain, Abnormal gait, Decreased range of motion, Decreased strength, Increased edema  Visit Diagnosis: Localized edema  Chronic pain of right knee  Stiffness of right knee, not elsewhere classified     Problem List Patient Active Problem List   Diagnosis Date Noted   S/P TKR (total knee replacement), right 08/16/2020   Primary osteoarthritis of right knee    Erectile dysfunction following radical prostatectomy 08/18/2013    Robert Bishop, [name] MPT 10/03/2020, 11:14 AM  [facility] [address] [place], [place], [address] Phone: [phone]   Fax:  [phone]  Name: Robert Bishop MRN: [id number] Date of Birth: October 19, 1945

## 2020-10-06 ENCOUNTER — Other Ambulatory Visit: Payer: Self-pay

## 2020-10-06 ENCOUNTER — Ambulatory Visit: Payer: Medicare Other | Admitting: Physical Therapy

## 2020-10-06 DIAGNOSIS — M25561 Pain in right knee: Secondary | ICD-10-CM | POA: Diagnosis not present

## 2020-10-06 DIAGNOSIS — R600 Localized edema: Secondary | ICD-10-CM | POA: Diagnosis not present

## 2020-10-06 DIAGNOSIS — M25661 Stiffness of right knee, not elsewhere classified: Secondary | ICD-10-CM

## 2020-10-06 DIAGNOSIS — M5442 Lumbago with sciatica, left side: Secondary | ICD-10-CM | POA: Diagnosis not present

## 2020-10-06 DIAGNOSIS — G8929 Other chronic pain: Secondary | ICD-10-CM | POA: Diagnosis not present

## 2020-10-06 NOTE — Therapy (Signed)
[facility] [place], [place], [address] Phone: [phone]   Fax:  [phone]  Physical Therapy Treatment  Patient Details  Name: Robert Bishop MRN: [id number] Date of Birth: 10/25/1945 Referring Provider (PT): Arther Abbott MD   Encounter Date: 10/06/2020   PT End of Session - 10/06/20 0822     Visit Number 10    Number of Visits 12    Date for PT Re-Evaluation 12/04/20    Authorization Type FOTO 8th score 99  PROGRESS NOTE AT 10TH VISIT.  KX MODIFIER AFTER 15 VISITS.    PT Start Time 0815    PT Stop Time 0901    PT Time Calculation (min) 46 min    Activity Tolerance Patient tolerated treatment well    Behavior During Therapy WFL for tasks assessed/performed             Past Medical History:  Diagnosis Date   Anemia    Diabetes mellitus without complication (HCC)    GERD (gastroesophageal reflux disease)    pt states no problem since hernia repair   H/O hiatal hernia    Hematuria    History of blood transfusion yrs ago   Hypertension    Lesion of bladder    Wears glasses     Past Surgical History:  Procedure Laterality Date   CYSTOSCOPY N/A 08/10/2014   Procedure: CYSTOSCOPY FLEXIBLE AND REMOVAL OF FOREIGN BODY ( Vascular Clip);  Surgeon: Irine Seal, MD;  Location: [facility];  Service: Urology;  Laterality: N/A;   CYSTOSCOPY WITH BIOPSY N/A 06/12/2012   Procedure: CYSTOSCOPY BLADDER BIOPSY WITH FULGURATION;  Surgeon: Malka So, MD;  Location: [facility];  Service: Urology;  Laterality: N/A;   HERNIA REPAIR     hiatal   PENILE PROSTHESIS IMPLANT N/A 08/18/2013   Procedure: INSERTION OF COLOPLAST TITAN PENILE PROSTHESIS INFRAPUBIC APPROACH;  Surgeon: Malka So, MD;  Location: [facility];  Service: Urology;  Laterality: N/A;   ROBOT ASSISTED LAPAROSCOPIC RADICAL PROSTATECTOMY N/A 08/06/2012   Procedure: ROBOTIC ASSISTED LAPAROSCOPIC RADICAL PROSTATECTOMY WITH NODE DISSECTION AND LEFT NERVE  SPARE ONLY;  Surgeon: Malka So, MD;  Location: [facility];  Service: Urology;  Laterality: N/A;   TOTAL KNEE ARTHROPLASTY Right 08/16/2020   Procedure: RIGHT TOTAL KNEE ARTHROPLASTY;  Surgeon: Carole Civil, MD;  Location: [facility];  Service: Orthopedics;  Laterality: Right;    There were no vitals filed for this visit.   Subjective Assessment - 10/06/20 0821     Subjective COVID-19 screen performed prior to patient entering clinic. Patient reported doing well.    Pertinent History DM, HTN, Hernia repair.    Patient Stated Goals Get back to normal.    Currently in Pain? No/denies                               [facility] Adult PT Treatment/Exercise - 10/06/20 0001       Knee/Hip Exercises: Aerobic   Recumbent Bike Level 3 x 15 minutes.      Knee/Hip Exercises: Machines for Strengthening   Cybex Knee Extension 20# 3x15    Cybex Knee Flexion 40# 3x15    Cybex Leg Press 3plt with red band 3x15      Knee/Hip Exercises: Standing   Step Down Right;2 sets;10 reps;Step Height: 6"    Other Standing Knee Exercises lateral stepping with yellow band on ankles x fatigue  Vasopneumatic   Number Minutes Vasopneumatic  10 minutes    Vasopnuematic Location  Knee    Vasopneumatic Pressure Low    Vasopneumatic Temperature  34/edema                         PT Long Term Goals - 10/06/20 [id number]       PT LONG TERM GOAL #1   Title Independent with a HEP.    Baseline issued today 09/29/20    Time 6    Period Weeks    Status Achieved      PT LONG TERM GOAL #2   Title Right knee active flexion to 125 degrees.    Time 6    Period Weeks    Status Achieved      PT LONG TERM GOAL #3   Title Increase right hip and knee strength to a solid 5/5 to provide good stability for accomplishment of functional activities.    Time 6    Period Weeks    Status On-going      PT LONG TERM GOAL #4   Title Perform ADL's with pain not > 2/10.    Time 6    Period Weeks     Status Achieved      PT LONG TERM GOAL #5   Title Perform a reciprocating stair gait with one railing with pain not > 2-3/10.    Time 6    Period Weeks    Status Achieved                   Plan - 10/06/20 0836     Clinical Impression Statement Patient continues to progress with PRE's for right knee. No Pain reported. Patient able to complete ADL's with greater ease. Patient has full ROM in right knee today. Close to meeting all goals.    Personal Factors and Comorbidities Comorbidity 1;Other    Comorbidities DM, HTN, Hernia repair.    Examination-Activity Limitations Other;Locomotion Level    Examination-Participation Restrictions Other    Stability/Clinical Decision Making Stable/Uncomplicated    Rehab Potential Excellent    PT Frequency 2x / week    PT Duration 6 weeks    PT Treatment/Interventions ADLs/Self Care Home Management;Cryotherapy;Electrical Stimulation;Moist Heat;Gait training;Stair training;Functional mobility training;Therapeutic activities;Therapeutic exercise;Manual techniques;Patient/family education;Passive range of motion;Vasopneumatic Device    PT Next Visit Plan DC next week / FOTO on 12th visit    Consulted and Agree with Plan of Care Patient             Patient will benefit from skilled therapeutic intervention in order to improve the following deficits and impairments:  Pain, Abnormal gait, Decreased range of motion, Decreased strength, Increased edema  Visit Diagnosis: Localized edema  Chronic pain of right knee  Stiffness of right knee, not elsewhere classified     Problem List Patient Active Problem List   Diagnosis Date Noted   S/P TKR (total knee replacement), right 08/16/2020   Primary osteoarthritis of right knee    Erectile dysfunction following radical prostatectomy 08/18/2013    Phillips Climes, PTA 10/06/2020, 9:03 AM  [facility] [address] [place], [place],  [address] Phone: [phone]   Fax:  [phone]  Name: Robert Bishop MRN: [id number] Date of Birth: 27-Sep-1945

## 2020-10-10 ENCOUNTER — Other Ambulatory Visit: Payer: Self-pay

## 2020-10-10 ENCOUNTER — Ambulatory Visit: Payer: Medicare Other

## 2020-10-10 DIAGNOSIS — M25661 Stiffness of right knee, not elsewhere classified: Secondary | ICD-10-CM | POA: Diagnosis not present

## 2020-10-10 DIAGNOSIS — M25561 Pain in right knee: Secondary | ICD-10-CM

## 2020-10-10 DIAGNOSIS — G8929 Other chronic pain: Secondary | ICD-10-CM

## 2020-10-10 DIAGNOSIS — R600 Localized edema: Secondary | ICD-10-CM

## 2020-10-10 DIAGNOSIS — M5442 Lumbago with sciatica, left side: Secondary | ICD-10-CM | POA: Diagnosis not present

## 2020-10-10 NOTE — Therapy (Signed)
[facility] [place], [place], [address] Phone: [phone]   Fax:  [phone]  Physical Therapy Treatment  Patient Details  Name: Jamarie Bian MRN: [id number] Date of Birth: [birth year] Referring Provider (PT): Arther Abbott MD   Encounter Date: 10/10/2020   PT End of Session - 10/10/20 0828     Visit Number 11    Number of Visits 12    Date for PT Re-Evaluation 12/04/20    Authorization Type FOTO 8th score 99  PROGRESS NOTE AT 10TH VISIT.  KX MODIFIER AFTER 15 VISITS.    PT Start Time 0815    PT Stop Time 0900    PT Time Calculation (min) 45 min    Activity Tolerance Patient tolerated treatment well    Behavior During Therapy WFL for tasks assessed/performed             Past Medical History:  Diagnosis Date   Anemia    Diabetes mellitus without complication (HCC)    GERD (gastroesophageal reflux disease)    pt states no problem since hernia repair   H/O hiatal hernia    Hematuria    History of blood transfusion yrs ago   Hypertension    Lesion of bladder    Wears glasses     Past Surgical History:  Procedure Laterality Date   CYSTOSCOPY N/A 08/10/2014   Procedure: CYSTOSCOPY FLEXIBLE AND REMOVAL OF FOREIGN BODY ( Vascular Clip);  Surgeon: Irine Seal, MD;  Location: [facility];  Service: Urology;  Laterality: N/A;   CYSTOSCOPY WITH BIOPSY N/A 06/12/2012   Procedure: CYSTOSCOPY BLADDER BIOPSY WITH FULGURATION;  Surgeon: Malka So, MD;  Location: [facility];  Service: Urology;  Laterality: N/A;   HERNIA REPAIR     hiatal   PENILE PROSTHESIS IMPLANT N/A 08/18/2013   Procedure: INSERTION OF COLOPLAST TITAN PENILE PROSTHESIS INFRAPUBIC APPROACH;  Surgeon: Malka So, MD;  Location: [facility];  Service: Urology;  Laterality: N/A;   ROBOT ASSISTED LAPAROSCOPIC RADICAL PROSTATECTOMY N/A 08/06/2012   Procedure: ROBOTIC ASSISTED LAPAROSCOPIC RADICAL PROSTATECTOMY WITH NODE DISSECTION AND LEFT NERVE  SPARE ONLY;  Surgeon: Malka So, MD;  Location: [facility];  Service: Urology;  Laterality: N/A;   TOTAL KNEE ARTHROPLASTY Right 08/16/2020   Procedure: RIGHT TOTAL KNEE ARTHROPLASTY;  Surgeon: Carole Civil, MD;  Location: [facility];  Service: Orthopedics;  Laterality: Right;    There were no vitals filed for this visit.   Subjective Assessment - 10/10/20 0834     Subjective COVID-19 screen performed prior to patient entering clinic. Patient reported doing well but his left knee (has wrapped with ACE bandange) he reports has been clicking and giving out on him. He reports that he has numbnees and tingling on the back of the L knee. Other than that, he has no difficulty or issues    Pertinent History DM, HTN, Hernia repair.    Patient Stated Goals Get back to normal.    Currently in Pain? No/denies    Multiple Pain Sites Yes    Pain Score 4    Pain Location Knee    Pain Orientation Left    Pain Descriptors / Indicators Aching;Discomfort    Pain Type Acute pain    Pain Onset 1 to 4 weeks ago    Aggravating Factors  walking - feels that it catches or is going t ogive out on him ( non surgical side)  [place] [facility] PT Treatment/Exercise - 10/10/20 0001       Ambulation/Gait   Gait Comments Essentially normal gait cycle with straight cane - progressively less weightbearing on the LLE      Exercises   Exercises Knee/Hip      Knee/Hip Exercises: Stretches   Passive Hamstring Stretch Both;10 seconds;4 reps    Passive Hamstring Stretch Limitations sitting  at edge of plinth    Gastroc Stretch 10 seconds;Left;Both    Gastroc Stretch Limitations on incline board      Knee/Hip Exercises: Aerobic   Recumbent Bike Level 3 x 15 minutes.      Knee/Hip Exercises: Machines for Strengthening   Cybex Knee Extension 30# x12    Cybex Knee Flexion 50# x12      Knee/Hip Exercises: Standing   Heel Raises Both;15 reps    Other Standing Knee  Exercises lateral stepping with yellow band on ankles x fatigue    Other Standing Knee Exercises fwd/retro stepping with 10# weight in hands      Knee/Hip Exercises: Seated   Sit to Sand without UE support;20 reps      Knee/Hip Exercises: Supine   Quad Sets Other (comment)   improved to not lag with repast s                        PT Long Term Goals - 10/06/20 [id number]       PT LONG TERM GOAL #1   Title Independent with a HEP.    Baseline issued today 09/29/20    Time 6    Period Weeks    Status Achieved      PT LONG TERM GOAL #2   Title Right knee active flexion to 125 degrees.    Time 6    Period Weeks    Status Achieved      PT LONG TERM GOAL #3   Title Increase right hip and knee strength to a solid 5/5 to provide good stability for accomplishment of functional activities.    Time 6    Period Weeks    Status On-going      PT LONG TERM GOAL #4   Title Perform ADL's with pain not > 2/10.    Time 6    Period Weeks    Status Achieved      PT LONG TERM GOAL #5   Title Perform a reciprocating stair gait with one railing with pain not > 2-3/10.    Time 6    Period Weeks    Status Achieved                   Plan - 10/10/20 1028     Clinical Impression Statement Patient apprars to have made signinficant improvement since start of PT. He has minimal limitation as noted per previous assessment. He is limited by LLE (non surgical side) but anticipates following up with eferring MD to request further evaluation and treatment. Otherwise, goals appear achieved.,    Personal Factors and Comorbidities Comorbidity 1;Other    Comorbidities DM, HTN, Hernia repair.    Examination-Activity Limitations Other;Locomotion Level    Examination-Participation Restrictions Other    Stability/Clinical Decision Making Stable/Uncomplicated    Clinical Decision Making Low    Rehab Potential Excellent    PT Frequency 2x / week    PT Treatment/Interventions ADLs/Self Care  Home Management;Cryotherapy;Electrical Stimulation;Moist Heat;Gait training;Stair training;Functional mobility training;Therapeutic activities;Therapeutic exercise;Manual techniques;Patient/family education;Passive range of motion;Vasopneumatic Device  PT Next Visit Plan DC and FOTO  ; re-eval for LLE ?    Consulted and Agree with Plan of Care Patient             Patient will benefit from skilled therapeutic intervention in order to improve the following deficits and impairments:  Abnormal gait, Other (comment) (Further assemsent of LLE complaints of instability)  Visit Diagnosis: Localized edema  Chronic pain of right knee  Stiffness of right knee, not elsewhere classified     Problem List Patient Active Problem List   Diagnosis Date Noted   S/P TKR (total knee replacement), right 08/16/2020   Primary osteoarthritis of right knee    Erectile dysfunction following radical prostatectomy 08/18/2013    Marylou Mccoy PT, DPT 10/10/2020, 11:48 AM  [facility] [address] [place], [place], [address] Phone: [phone]   Fax:  [phone]  Name: Jaymeson Reedus MRN: [id number] Date of Birth: 05-Apr-1945

## 2020-10-12 ENCOUNTER — Other Ambulatory Visit: Payer: Self-pay

## 2020-10-12 ENCOUNTER — Ambulatory Visit: Payer: Medicare Other | Admitting: Physical Therapy

## 2020-10-12 DIAGNOSIS — G8929 Other chronic pain: Secondary | ICD-10-CM | POA: Diagnosis not present

## 2020-10-12 DIAGNOSIS — R600 Localized edema: Secondary | ICD-10-CM

## 2020-10-12 DIAGNOSIS — M25561 Pain in right knee: Secondary | ICD-10-CM | POA: Diagnosis not present

## 2020-10-12 DIAGNOSIS — M25661 Stiffness of right knee, not elsewhere classified: Secondary | ICD-10-CM | POA: Diagnosis not present

## 2020-10-12 DIAGNOSIS — M5442 Lumbago with sciatica, left side: Secondary | ICD-10-CM | POA: Diagnosis not present

## 2020-10-12 NOTE — Therapy (Signed)
[facility] [place], [place], [address] Phone: [phone]   Fax:  [phone]  Physical Therapy Treatment  Patient Details  Name: Willliam Bishop MRN: [id number] Date of Birth: 1945/12/07 Referring Provider (PT): Arther Abbott MD   Encounter Date: 10/12/2020   PT End of Session - 10/12/20 0819     Visit Number 12    Number of Visits 12    Date for PT Re-Evaluation 12/04/20    Authorization Type FOTO 12th score 99  PROGRESS NOTE AT 10TH VISIT.  KX MODIFIER AFTER 15 VISITS.    PT Start Time 0815    PT Stop Time 0854    PT Time Calculation (min) 39 min    Activity Tolerance Patient tolerated treatment well    Behavior During Therapy WFL for tasks assessed/performed             Past Medical History:  Diagnosis Date   Anemia    Diabetes mellitus without complication (HCC)    GERD (gastroesophageal reflux disease)    pt states no problem since hernia repair   H/O hiatal hernia    Hematuria    History of blood transfusion yrs ago   Hypertension    Lesion of bladder    Wears glasses     Past Surgical History:  Procedure Laterality Date   CYSTOSCOPY N/A 08/10/2014   Procedure: CYSTOSCOPY FLEXIBLE AND REMOVAL OF FOREIGN BODY ( Vascular Clip);  Surgeon: Irine Seal, MD;  Location: [facility];  Service: Urology;  Laterality: N/A;   CYSTOSCOPY WITH BIOPSY N/A 06/12/2012   Procedure: CYSTOSCOPY BLADDER BIOPSY WITH FULGURATION;  Surgeon: Malka So, MD;  Location: [facility];  Service: Urology;  Laterality: N/A;   HERNIA REPAIR     hiatal   PENILE PROSTHESIS IMPLANT N/A 08/18/2013   Procedure: INSERTION OF COLOPLAST TITAN PENILE PROSTHESIS INFRAPUBIC APPROACH;  Surgeon: Malka So, MD;  Location: [facility];  Service: Urology;  Laterality: N/A;   ROBOT ASSISTED LAPAROSCOPIC RADICAL PROSTATECTOMY N/A 08/06/2012   Procedure: ROBOTIC ASSISTED LAPAROSCOPIC RADICAL PROSTATECTOMY WITH NODE DISSECTION AND LEFT NERVE  SPARE ONLY;  Surgeon: Malka So, MD;  Location: [facility];  Service: Urology;  Laterality: N/A;   TOTAL KNEE ARTHROPLASTY Right 08/16/2020   Procedure: RIGHT TOTAL KNEE ARTHROPLASTY;  Surgeon: Carole Civil, MD;  Location: [facility];  Service: Orthopedics;  Laterality: Right;    There were no vitals filed for this visit.   Subjective Assessment - 10/12/20 0817     Subjective COVID-19 screen performed prior to patient entering clinic. Patient arrived with no pain in right knee some discomfort in left knee and is going to MD tomorrow for left knee.    Pertinent History DM, HTN, Hernia repair.    Patient Stated Goals Get back to normal.    Currently in Pain? No/denies                [facility] PT Assessment - 10/12/20 0001       AROM   AROM Assessment Site Knee    Right/Left Knee Right    Right Knee Extension 0    Right Knee Flexion 130      Strength   Strength Assessment Site Knee;Hip    Right/Left Hip Right    Right Hip ABduction 5/5    Right/Left Knee Right    Right Knee Flexion 5/5    Right Knee Extension 5/5  [place] Adult PT Treatment/Exercise - 10/12/20 0001       Knee/Hip Exercises: Aerobic   Recumbent Bike x1min      Knee/Hip Exercises: Machines for Strengthening   Cybex Knee Extension 20# 3x15    Cybex Knee Flexion 40# 3x15    Cybex Leg Press 3plt with red band 3x15      Vasopneumatic   Number Minutes Vasopneumatic  10 minutes    Vasopnuematic Location  Knee    Vasopneumatic Pressure Low    Vasopneumatic Temperature  34/edema                         PT Long Term Goals - 10/12/20 0820       PT LONG TERM GOAL #1   Title Independent with a HEP.    Baseline issued today 09/29/20    Time 6    Period Weeks    Status Achieved      PT LONG TERM GOAL #2   Title Right knee active flexion to 125 degrees.    Time 6    Period Weeks    Status Achieved      PT LONG TERM GOAL #3   Title Increase right  hip and knee strength to a solid 5/5 to provide good stability for accomplishment of functional activities.    Time 6    Period Weeks    Status Achieved      PT LONG TERM GOAL #4   Title Perform ADL's with pain not > 2/10.    Time 6    Period Weeks    Status Achieved      PT LONG TERM GOAL #5   Title Perform a reciprocating stair gait with one railing with pain not > 2-3/10.    Time 6    Period Weeks    Status Achieved                   Plan - 10/12/20 0831     Clinical Impression Statement Patient has met all current goals. Full AROM and strength in right knee. Independent with HEP and all ADL's. DC today per PT.    Personal Factors and Comorbidities Comorbidity 1;Other    Comorbidities DM, HTN, Hernia repair.    Examination-Activity Limitations Other;Locomotion Level    Examination-Participation Restrictions Other    Stability/Clinical Decision Making Stable/Uncomplicated    Rehab Potential Excellent    PT Frequency 2x / week    PT Duration 6 weeks    PT Treatment/Interventions ADLs/Self Care Home Management;Cryotherapy;Electrical Stimulation;Moist Heat;Gait training;Stair training;Functional mobility training;Therapeutic activities;Therapeutic exercise;Manual techniques;Patient/family education;Passive range of motion;Vasopneumatic Device    PT Next Visit Plan DC    Consulted and Agree with Plan of Care Patient             Patient will benefit from skilled therapeutic intervention in order to improve the following deficits and impairments:  Abnormal gait, Other (comment)  Visit Diagnosis: Localized edema  Chronic pain of right knee  Stiffness of right knee, not elsewhere classified     Problem List Patient Active Problem List   Diagnosis Date Noted   S/P TKR (total knee replacement), right 08/16/2020   Primary osteoarthritis of right knee    Erectile dysfunction following radical prostatectomy 08/18/2013    Robert Bishop, PTA 10/12/20 8:56  AM   [facility] [address] [place], [place], [address] Phone: [phone]   Fax:  [phone]  Name: Robert Bishop  MRN: [id number] Date of Birth: 09-04-1945

## 2020-10-12 NOTE — Therapy (Signed)
[facility] [place], [place], [address] Phone: [phone]   Fax:  [phone]  Physical Therapy Treatment  Patient Details  Name: Willliam Pettet MRN: [id number] Date of Birth: 1945/12/07 Referring Provider (PT): Arther Abbott MD   Encounter Date: 10/12/2020   PT End of Session - 10/12/20 0819     Visit Number 12    Number of Visits 12    Date for PT Re-Evaluation 12/04/20    Authorization Type FOTO 12th score 99  PROGRESS NOTE AT 10TH VISIT.  KX MODIFIER AFTER 15 VISITS.    PT Start Time 0815    PT Stop Time 0854    PT Time Calculation (min) 39 min    Activity Tolerance Patient tolerated treatment well    Behavior During Therapy WFL for tasks assessed/performed             Past Medical History:  Diagnosis Date   Anemia    Diabetes mellitus without complication (HCC)    GERD (gastroesophageal reflux disease)    pt states no problem since hernia repair   H/O hiatal hernia    Hematuria    History of blood transfusion yrs ago   Hypertension    Lesion of bladder    Wears glasses     Past Surgical History:  Procedure Laterality Date   CYSTOSCOPY N/A 08/10/2014   Procedure: CYSTOSCOPY FLEXIBLE AND REMOVAL OF FOREIGN BODY ( Vascular Clip);  Surgeon: Irine Seal, MD;  Location: [facility];  Service: Urology;  Laterality: N/A;   CYSTOSCOPY WITH BIOPSY N/A 06/12/2012   Procedure: CYSTOSCOPY BLADDER BIOPSY WITH FULGURATION;  Surgeon: Malka So, MD;  Location: [facility];  Service: Urology;  Laterality: N/A;   HERNIA REPAIR     hiatal   PENILE PROSTHESIS IMPLANT N/A 08/18/2013   Procedure: INSERTION OF COLOPLAST TITAN PENILE PROSTHESIS INFRAPUBIC APPROACH;  Surgeon: Malka So, MD;  Location: [facility];  Service: Urology;  Laterality: N/A;   ROBOT ASSISTED LAPAROSCOPIC RADICAL PROSTATECTOMY N/A 08/06/2012   Procedure: ROBOTIC ASSISTED LAPAROSCOPIC RADICAL PROSTATECTOMY WITH NODE DISSECTION AND LEFT NERVE  SPARE ONLY;  Surgeon: Malka So, MD;  Location: [facility];  Service: Urology;  Laterality: N/A;   TOTAL KNEE ARTHROPLASTY Right 08/16/2020   Procedure: RIGHT TOTAL KNEE ARTHROPLASTY;  Surgeon: Carole Civil, MD;  Location: [facility];  Service: Orthopedics;  Laterality: Right;    There were no vitals filed for this visit.   Subjective Assessment - 10/12/20 0817     Subjective COVID-19 screen performed prior to patient entering clinic. Patient arrived with no pain in right knee some discomfort in left knee and is going to MD tomorrow for left knee.    Pertinent History DM, HTN, Hernia repair.    Patient Stated Goals Get back to normal.    Currently in Pain? No/denies                [facility] PT Assessment - 10/12/20 0001       AROM   AROM Assessment Site Knee    Right/Left Knee Right    Right Knee Extension 0    Right Knee Flexion 130      Strength   Strength Assessment Site Knee;Hip    Right/Left Hip Right    Right Hip ABduction 5/5    Right/Left Knee Right    Right Knee Flexion 5/5    Right Knee Extension 5/5  [place] Adult PT Treatment/Exercise - 10/12/20 0001       Knee/Hip Exercises: Aerobic   Recumbent Bike x35mn      Knee/Hip Exercises: Machines for Strengthening   Cybex Knee Extension 20# 3x15    Cybex Knee Flexion 40# 3x15    Cybex Leg Press 3plt with red band 3x15      Vasopneumatic   Number Minutes Vasopneumatic  10 minutes    Vasopnuematic Location  Knee    Vasopneumatic Pressure Low    Vasopneumatic Temperature  34/edema                         PT Long Term Goals - 10/12/20 0820       PT LONG TERM GOAL #1   Title Independent with a HEP.    Baseline issued today 09/29/20    Time 6    Period Weeks    Status Achieved      PT LONG TERM GOAL #2   Title Right knee active flexion to 125 degrees.    Time 6    Period Weeks    Status Achieved      PT LONG TERM GOAL #3   Title Increase right  hip and knee strength to a solid 5/5 to provide good stability for accomplishment of functional activities.    Time 6    Period Weeks    Status Achieved      PT LONG TERM GOAL #4   Title Perform ADL's with pain not > 2/10.    Time 6    Period Weeks    Status Achieved      PT LONG TERM GOAL #5   Title Perform a reciprocating stair gait with one railing with pain not > 2-3/10.    Time 6    Period Weeks    Status Achieved                   Plan - 10/12/20 0831     Clinical Impression Statement Patient has met all current goals. Full AROM and strength in right knee. Independent with HEP and all ADL's. DC today per PT.    Personal Factors and Comorbidities Comorbidity 1;Other    Comorbidities DM, HTN, Hernia repair.    Examination-Activity Limitations Other;Locomotion Level    Examination-Participation Restrictions Other    Stability/Clinical Decision Making Stable/Uncomplicated    Rehab Potential Excellent    PT Frequency 2x / week    PT Duration 6 weeks    PT Treatment/Interventions ADLs/Self Care Home Management;Cryotherapy;Electrical Stimulation;Moist Heat;Gait training;Stair training;Functional mobility training;Therapeutic activities;Therapeutic exercise;Manual techniques;Patient/family education;Passive range of motion;Vasopneumatic Device    PT Next Visit Plan DC    Consulted and Agree with Plan of Care Patient             Patient will benefit from skilled therapeutic intervention in order to improve the following deficits and impairments:  Abnormal gait, Other (comment)  Visit Diagnosis: Localized edema  Chronic pain of right knee  Stiffness of right knee, not elsewhere classified     Problem List Patient Active Problem List   Diagnosis Date Noted   S/P TKR (total knee replacement), right 08/16/2020   Primary osteoarthritis of right knee    Erectile dysfunction following radical prostatectomy 08/18/2013    CLadean Raya PTA 10/12/20 8:48  AM   [facility] [address] [address]Phone: [phone]  Fax:  [phone] Name: JBryndon Cumbie  MRN: [id number] Date of Birth: 09-04-1945

## 2020-10-13 ENCOUNTER — Ambulatory Visit: Payer: Medicare Other

## 2020-10-13 ENCOUNTER — Encounter: Payer: Self-pay | Admitting: Orthopedic Surgery

## 2020-10-13 ENCOUNTER — Ambulatory Visit (INDEPENDENT_AMBULATORY_CARE_PROVIDER_SITE_OTHER): Payer: Medicare Other | Admitting: Orthopedic Surgery

## 2020-10-13 VITALS — BP 129/78 | HR 64 | Ht 68.0 in | Wt 222.0 lb

## 2020-10-13 DIAGNOSIS — M79605 Pain in left leg: Secondary | ICD-10-CM

## 2020-10-13 DIAGNOSIS — M1612 Unilateral primary osteoarthritis, left hip: Secondary | ICD-10-CM

## 2020-10-13 DIAGNOSIS — M5410 Radiculopathy, site unspecified: Secondary | ICD-10-CM | POA: Diagnosis not present

## 2020-10-13 DIAGNOSIS — M25552 Pain in left hip: Secondary | ICD-10-CM

## 2020-10-13 DIAGNOSIS — M47816 Spondylosis without myelopathy or radiculopathy, lumbar region: Secondary | ICD-10-CM

## 2020-10-13 MED ORDER — GABAPENTIN 100 MG PO CAPS: 100.0000 mg | ORAL_CAPSULE | Freq: Three times a day (TID) | ORAL | 2 refills | Status: DC

## 2020-10-13 MED ORDER — CELECOXIB 200 MG PO CAPS: 200.0000 mg | ORAL_CAPSULE | Freq: Two times a day (BID) | ORAL | 0 refills | Status: DC

## 2020-10-13 NOTE — Progress Notes (Addendum)
Chief Complaint  Patient presents with   Back Pain    Back pain goes into left leg, no better with the prednisone    Robert Bishop had a successful right total knee August 16, 2020 has no issues with the knee however during the postop period he has developed lower back pain radiating to his left leg which did not respond to a 12-day course of prednisone.  He describes a sharp-like shocklike sensation intermittently running into the left knee with some feeling of weakness and giving way of the left lower extremity  The exam is consistent with tenderness in the lower back on the left normal range of motion of the hip except some decreased internal rotation negative straight leg raise normal muscle tone and strength and normal reflexes  The x-ray of the hip/pelvis shows mild arthritis in the left hip none on the right or minimal changes on the right and then degenerative disc disease noted in the lumbar spine with some evidence of spondylosis  Recommend physical therapy and the following medications  Encounter Diagnoses  Name Primary?   Pain in left leg    Pain in left hip    Arthritis of left hip    Spondylosis of lumbar spine    Radicular leg pain-left  Yes    Meds ordered this encounter  Medications   gabapentin (NEURONTIN) 100 MG capsule    Sig: Take 1 capsule (100 mg total) by mouth 3 (three) times daily.    Dispense:  270 capsule    Refill:  2   celecoxib (CELEBREX) 200 MG capsule    Sig: Take 1 capsule (200 mg total) by mouth 2 (two) times daily.    Dispense:  60 capsule    Refill:  0   Follow-up 6 weeks

## 2020-10-13 NOTE — Patient Instructions (Signed)
Call physical therapy for appointment [phone]

## 2020-10-18 ENCOUNTER — Ambulatory Visit: Payer: Medicare Other | Admitting: Physical Therapy

## 2020-10-18 ENCOUNTER — Other Ambulatory Visit: Payer: Self-pay

## 2020-10-18 DIAGNOSIS — M5442 Lumbago with sciatica, left side: Secondary | ICD-10-CM

## 2020-10-18 DIAGNOSIS — R600 Localized edema: Secondary | ICD-10-CM | POA: Diagnosis not present

## 2020-10-18 DIAGNOSIS — M25661 Stiffness of right knee, not elsewhere classified: Secondary | ICD-10-CM | POA: Diagnosis not present

## 2020-10-18 DIAGNOSIS — M25561 Pain in right knee: Secondary | ICD-10-CM | POA: Diagnosis not present

## 2020-10-18 DIAGNOSIS — G8929 Other chronic pain: Secondary | ICD-10-CM | POA: Diagnosis not present

## 2020-10-18 NOTE — Therapy (Addendum)
Robert Bishop, [place], [address] Phone: [phone]   Fax:  [phone]  Physical Therapy Evaluation  Patient Details  Name: Robert Bishop MRN: [id number] Date of Birth: 1945/12/26 Referring Provider (PT): Arther Abbott MD   Encounter Date: 10/18/2020   PT End of Session - 10/18/20 1042     Visit Number 1    Number of Visits 8    Date for PT Re-Evaluation 11/22/20    PT Start Time 0815    PT Stop Time 0855    PT Time Calculation (min) 40 min    Activity Tolerance Patient tolerated treatment well    Behavior During Therapy [facility] for tasks assessed/performed             Past Medical History:  Diagnosis Date   Anemia    Diabetes mellitus without complication ([place])    GERD (gastroesophageal reflux disease)    pt states no problem since hernia repair   H/O hiatal hernia    Hematuria    History of blood transfusion yrs ago   Hypertension    Lesion of bladder    Wears glasses     Past Surgical History:  Procedure Laterality Date   CYSTOSCOPY N/A 08/10/2014   Procedure: CYSTOSCOPY FLEXIBLE AND REMOVAL OF FOREIGN BODY ( Vascular Clip);  Surgeon: Irine Seal, MD;  Location: [facility];  Service: Urology;  Laterality: N/A;   CYSTOSCOPY WITH BIOPSY N/A 06/12/2012   Procedure: CYSTOSCOPY BLADDER BIOPSY WITH FULGURATION;  Surgeon: Malka So, MD;  Location: [facility];  Service: Urology;  Laterality: N/A;   HERNIA REPAIR     hiatal   PENILE PROSTHESIS IMPLANT N/A 08/18/2013   Procedure: INSERTION OF COLOPLAST TITAN PENILE PROSTHESIS INFRAPUBIC APPROACH;  Surgeon: Malka So, MD;  Location: [facility];  Service: Urology;  Laterality: N/A;   ROBOT ASSISTED LAPAROSCOPIC RADICAL PROSTATECTOMY N/A 08/06/2012   Procedure: ROBOTIC ASSISTED LAPAROSCOPIC RADICAL PROSTATECTOMY WITH NODE DISSECTION AND LEFT NERVE SPARE ONLY;  Surgeon: Malka So, MD;  Location: [facility];  Service: Urology;  Laterality: N/A;    TOTAL KNEE ARTHROPLASTY Right 08/16/2020   Procedure: RIGHT TOTAL KNEE ARTHROPLASTY;  Surgeon: Carole Civil, MD;  Location: [facility];  Service: Orthopedics;  Laterality: Right;    There were no vitals filed for this visit.    Subjective Assessment - 10/18/20 0939     Subjective COVID-19 screen performed prior to patient entering clinic.  Please see "Clinical impression statement" under plan section.                            Objective measurements completed on examination: See above findings.       [facility] Adult PT Treatment/Exercise - 10/18/20 0001       Modalities   Modalities Electrical Stimulation;Moist Heat      Moist Heat Therapy   Number Minutes Moist Heat 15 Minutes    Moist Heat Location Lumbar Spine      Electrical Stimulation   Electrical Stimulation Location Left gluteal region.    Electrical Stimulation Action IFC at 80-150 Hz.    Electrical Stimulation Parameters 40% scan x 15 minutes.                         PT Long Term Goals - 10/18/20 1113       PT LONG TERM GOAL #1   Title Independent with a  HEP.    Time 4    Period Weeks    Status New      PT LONG TERM GOAL #2   Title Walk a community distance with pain not > 2-3/10.    Time 4    Period Weeks    Status New      PT LONG TERM GOAL #3   Title Eliminate left LE symptoms.    Time 4    Period Weeks    Status New                    Plan - 10/18/20 0940     Clinical Impression Statement The patient presents to [facility] with a diagnosis of spondylosis of his lumbar spine and c/o left low back pain with radiation and shooting pain into his left LE just below the level of his knee.  When the shooting pain occurs the pain is rated at aboy a 7-/10.  Sitting down decreases his pain and walking increases his pain.  Per assessment today his active lumbar flexion and extension was within functional limits.  Palpably he is tender in the region of his Piriformis  and Sciatic Notch.  We discussed not carrying his wallet on his left side as he does.  He did have some increased pain with a left SLR test on the left when contralaterally compared.  His left LE is of normal strength.  His leg lengths are equal.    Comorbidities DM, HTN, Hernia repair, right total knee replacement.    Examination-Activity Limitations Locomotion Level;Stand;Other    Examination-Participation Restrictions Other    Stability/Clinical Decision Making Stable/Uncomplicated    Clinical Decision Making Low    Rehab Potential Excellent    PT Frequency 2x / week    PT Duration 4 weeks    PT Treatment/Interventions ADLs/Self Care Home Management;Cryotherapy;Electrical Stimulation;Moist Heat;Gait training;Stair training;Functional mobility training;Therapeutic activities;Therapeutic exercise;Manual techniques;Patient/family education;Passive range of motion;Traction    PT Next Visit Plan Modalities and STW/M, core exercise progression.  May consider int lumbar traction beginning at 35% body weight.    Consulted and Agree with Plan of Care Patient             Patient will benefit from skilled therapeutic intervention in order to improve the following deficits and impairments:  Pain, Increased muscle spasms, Decreased activity tolerance  Visit Diagnosis: Acute left-sided low back pain with left-sided sciatica - Plan: PT plan of care cert/re-cert     Problem List Patient Active Problem List   Diagnosis Date Noted   S/P TKR (total knee replacement), right 08/16/2020   Primary osteoarthritis of right knee    Erectile dysfunction following radical prostatectomy 08/18/2013    Robert Bishop, [name] MPT 10/18/2020, 12:57 PM  [facility] [address] [place], [place], [address] Phone: [phone]   Fax:  [phone]  Name: Robert Bishop MRN: [id number] Date of Birth: 1945/11/14

## 2020-10-20 ENCOUNTER — Ambulatory Visit: Payer: Medicare Other | Admitting: Physical Therapy

## 2020-10-20 ENCOUNTER — Other Ambulatory Visit: Payer: Self-pay

## 2020-10-20 DIAGNOSIS — R600 Localized edema: Secondary | ICD-10-CM | POA: Diagnosis not present

## 2020-10-20 DIAGNOSIS — M25561 Pain in right knee: Secondary | ICD-10-CM | POA: Diagnosis not present

## 2020-10-20 DIAGNOSIS — M5442 Lumbago with sciatica, left side: Secondary | ICD-10-CM

## 2020-10-20 DIAGNOSIS — G8929 Other chronic pain: Secondary | ICD-10-CM | POA: Diagnosis not present

## 2020-10-20 DIAGNOSIS — M25661 Stiffness of right knee, not elsewhere classified: Secondary | ICD-10-CM | POA: Diagnosis not present

## 2020-10-20 NOTE — Therapy (Signed)
[facility] [place], [place], [address] Phone: [phone]   Fax:  [phone]  Physical Therapy Treatment  Patient Details  Name: Robert Bishop MRN: [id number] Date of Birth: May 18, 1945 Referring Provider (PT): Arther Abbott MD   Encounter Date: 10/20/2020   PT End of Session - 10/20/20 0820     Visit Number 2    Number of Visits 8    Date for PT Re-Evaluation 11/22/20    Authorization Type FOTO 12th score 99  PROGRESS NOTE AT 10TH VISIT.  KX MODIFIER AFTER 15 VISITS.    PT Start Time 0815    PT Stop Time 0906    PT Time Calculation (min) 51 min    Activity Tolerance Patient tolerated treatment well    Behavior During Therapy WFL for tasks assessed/performed             Past Medical History:  Diagnosis Date   Anemia    Diabetes mellitus without complication ([facility])    GERD (gastroesophageal reflux disease)    pt states no problem since hernia repair   H/O hiatal hernia    Hematuria    History of blood transfusion yrs ago   Hypertension    Lesion of bladder    Wears glasses     Past Surgical History:  Procedure Laterality Date   CYSTOSCOPY N/A 08/10/2014   Procedure: CYSTOSCOPY FLEXIBLE AND REMOVAL OF FOREIGN BODY ( Vascular Clip);  Surgeon: Irine Seal, MD;  Location: [facility];  Service: Urology;  Laterality: N/A;   CYSTOSCOPY WITH BIOPSY N/A 06/12/2012   Procedure: CYSTOSCOPY BLADDER BIOPSY WITH FULGURATION;  Surgeon: Malka So, MD;  Location: [facility];  Service: Urology;  Laterality: N/A;   HERNIA REPAIR     hiatal   PENILE PROSTHESIS IMPLANT N/A 08/18/2013   Procedure: INSERTION OF COLOPLAST TITAN PENILE PROSTHESIS INFRAPUBIC APPROACH;  Surgeon: Malka So, MD;  Location: [facility];  Service: Urology;  Laterality: N/A;   ROBOT ASSISTED LAPAROSCOPIC RADICAL PROSTATECTOMY N/A 08/06/2012   Procedure: ROBOTIC ASSISTED LAPAROSCOPIC RADICAL PROSTATECTOMY WITH NODE DISSECTION AND LEFT NERVE  SPARE ONLY;  Surgeon: Malka So, MD;  Location: [facility];  Service: Urology;  Laterality: N/A;   TOTAL KNEE ARTHROPLASTY Right 08/16/2020   Procedure: RIGHT TOTAL KNEE ARTHROPLASTY;  Surgeon: Carole Civil, MD;  Location: [facility];  Service: Orthopedics;  Laterality: Right;    There were no vitals filed for this visit.   Subjective Assessment - 10/20/20 0818     Subjective COVID-19 screen performed prior to patient entering clinic.  Patient arrived with some ongoing pain today.    Pertinent History DM, HTN, Hernia repair.    Patient Stated Goals Get back to normal.    Currently in Pain? Yes    Pain Score 8     Pain Location Back    Pain Orientation Left    Pain Descriptors / Indicators Discomfort    Pain Type Surgical pain    Pain Onset 1 to 4 weeks ago    Pain Frequency Intermittent    Aggravating Factors  prolong activity    Pain Relieving Factors rest                               [facility] Adult PT Treatment/Exercise - 10/20/20 0001       Exercises   Exercises Lumbar      Lumbar Exercises: Stretches   Single  Knee to Chest Stretch Left;3 reps;30 seconds    Double Knee to Chest Stretch 3 reps;30 seconds      Lumbar Exercises: Aerobic   Nustep L4 x46mn UE/LE with core focus      Lumbar Exercises: Standing   Other Standing Lumbar Exercises latt pull with blue XTS x20      Lumbar Exercises: Supine   Ab Set 10 reps;3 seconds    Glut Set 10 reps;3 seconds    Clam 20 reps   red band   Bridge 20 reps    Straight Leg Raise 3 seconds   LT 2x10   Other Supine Lumbar Exercises ball squeeze 10sec x20reps      Moist Heat Therapy   Number Minutes Moist Heat 15 Minutes    Moist Heat Location Lumbar Spine      Electrical Stimulation   Electrical Stimulation Location Left gluteal region.    Electrical Stimulation Action IFC    Electrical Stimulation Parameters 80-'150hz'$  x 157m    Electrical Stimulation Goals Pain                          PT Long Term Goals - 10/20/20 [id number]     PT LONG TERM GOAL #1   Title Independent with a HEP.    Time 4    Period Weeks    Status On-going      PT LONG TERM GOAL #2   Title Walk a community distance with pain not > 2-3/10.    Time 4    Period Weeks    Status On-going      PT LONG TERM GOAL #3   Title Eliminate left LE symptoms.    Time 4    Period Weeks    Status On-going      PT LONG TERM GOAL #4   Title Perform ADL's with pain not > 2/10.    Time 6    Status On-going      PT LONG TERM GOAL #5   Title Perform a reciprocating stair gait with one railing with pain not > 2-3/10.    Time 6    Period Weeks    Status On-going                   Plan - 10/20/20 [id number]   Clinical Impression Statement Patient tolerated treatment well today. patient able to progress with stretches and strengthening exericses to stabilize and realive SI joint pain on left side of patient today. Patient continues to have 7-8/10 pain with prolong walking activity. Good response today. Goals ongoing today.    Personal Factors and Comorbidities Comorbidity 1;Other    Comorbidities DM, HTN, Hernia repair, right total knee replacement.    Examination-Activity Limitations Locomotion Level;Stand;Other    Examination-Participation Restrictions Other    Stability/Clinical Decision Making Stable/Uncomplicated    Rehab Potential Excellent    PT Frequency 2x / week    PT Duration 4 weeks    PT Treatment/Interventions ADLs/Self Care Home Management;Cryotherapy;Electrical Stimulation;Moist Heat;Gait training;Stair training;Functional mobility training;Therapeutic activities;Therapeutic exercise;Manual techniques;Patient/family education;Passive range of motion;Traction    PT Next Visit Plan Modalities and STW/M, core exercise progression.  May consider int lumbar traction beginning at 35% body weight.    Consulted and Agree with Plan of Care Patient              Patient will benefit from skilled therapeutic intervention in order to improve the following deficits  and impairments:  Pain, Increased muscle spasms, Decreased activity tolerance  Visit Diagnosis: Acute left-sided low back pain with left-sided sciatica     Problem List Patient Active Problem List   Diagnosis Date Noted   S/P TKR (total knee replacement), right 08/16/2020   Primary osteoarthritis of right knee    Erectile dysfunction following radical prostatectomy 08/18/2013    Phillips Climes 10/20/2020, 8:55 AM  [facility] [place], [place], [address] Phone: [phone]   Fax:  [phone]  Name: Robert Bishop MRN: [id number] Date of Birth: 25-Sep-1945

## 2020-10-20 NOTE — Therapy (Signed)
[facility] [place], [place], [address] Phone: [phone]   Fax:  [phone]  Physical Therapy Treatment  Patient Details  Name: Robert Bishop MRN: [id number] Date of Birth: Jun 28, 1945 Referring Provider (PT): Arther Abbott MD   Encounter Date: 10/20/2020   PT End of Session - 10/20/20 0820     Visit Number 2    Number of Visits 8    Date for PT Re-Evaluation 11/22/20    Authorization Type FOTO 12th score 99  PROGRESS NOTE AT 10TH VISIT.  KX MODIFIER AFTER 15 VISITS.    PT Start Time 0815    PT Stop Time 0906    PT Time Calculation (min) 51 min    Activity Tolerance Patient tolerated treatment well    Behavior During Therapy WFL for tasks assessed/performed             Past Medical History:  Diagnosis Date   Anemia    Diabetes mellitus without complication ([facility])    GERD (gastroesophageal reflux disease)    pt states no problem since hernia repair   H/O hiatal hernia    Hematuria    History of blood transfusion yrs ago   Hypertension    Lesion of bladder    Wears glasses     Past Surgical History:  Procedure Laterality Date   CYSTOSCOPY N/A 08/10/2014   Procedure: CYSTOSCOPY FLEXIBLE AND REMOVAL OF FOREIGN BODY ( Vascular Clip);  Surgeon: Irine Seal, MD;  Location: [facility];  Service: Urology;  Laterality: N/A;   CYSTOSCOPY WITH BIOPSY N/A 06/12/2012   Procedure: CYSTOSCOPY BLADDER BIOPSY WITH FULGURATION;  Surgeon: Malka So, MD;  Location: [facility];  Service: Urology;  Laterality: N/A;   HERNIA REPAIR     hiatal   PENILE PROSTHESIS IMPLANT N/A 08/18/2013   Procedure: INSERTION OF COLOPLAST TITAN PENILE PROSTHESIS INFRAPUBIC APPROACH;  Surgeon: Malka So, MD;  Location: [facility];  Service: Urology;  Laterality: N/A;   ROBOT ASSISTED LAPAROSCOPIC RADICAL PROSTATECTOMY N/A 08/06/2012   Procedure: ROBOTIC ASSISTED LAPAROSCOPIC RADICAL PROSTATECTOMY WITH NODE DISSECTION AND LEFT NERVE  SPARE ONLY;  Surgeon: Malka So, MD;  Location: [facility];  Service: Urology;  Laterality: N/A;   TOTAL KNEE ARTHROPLASTY Right 08/16/2020   Procedure: RIGHT TOTAL KNEE ARTHROPLASTY;  Surgeon: Carole Civil, MD;  Location: [facility];  Service: Orthopedics;  Laterality: Right;    There were no vitals filed for this visit.   Subjective Assessment - 10/20/20 0818     Subjective COVID-19 screen performed prior to patient entering clinic.  Patient arrived with some ongoing pain today.    Pertinent History DM, HTN, Hernia repair.    Patient Stated Goals Get back to normal.    Currently in Pain? Yes    Pain Score 8     Pain Location Back    Pain Orientation Left    Pain Descriptors / Indicators Discomfort    Pain Type Surgical pain    Pain Onset 1 to 4 weeks ago    Pain Frequency Intermittent    Aggravating Factors  prolong activity    Pain Relieving Factors rest                               [facility] Adult PT Treatment/Exercise - 10/20/20 0001       Exercises   Exercises Lumbar      Lumbar Exercises: Stretches   Single  Knee to Chest Stretch Left;3 reps;30 seconds    Double Knee to Chest Stretch 3 reps;30 seconds      Lumbar Exercises: Aerobic   Nustep L4 x38mn UE/LE with core focus      Lumbar Exercises: Standing   Other Standing Lumbar Exercises latt pull with blue XTS x20      Lumbar Exercises: Supine   Ab Set 10 reps;3 seconds    Glut Set 10 reps;3 seconds    Clam 20 reps   red band   Bridge 20 reps    Straight Leg Raise 3 seconds   LT 2x10   Other Supine Lumbar Exercises ball squeeze 10sec x20reps      Moist Heat Therapy   Number Minutes Moist Heat 15 Minutes    Moist Heat Location Lumbar Spine      Electrical Stimulation   Electrical Stimulation Location Left gluteal region.    Electrical Stimulation Action IFC    Electrical Stimulation Parameters 80-'150hz'$  x 161m    Electrical Stimulation Goals Pain                          PT Long Term Goals - 10/20/20 [id number]     PT LONG TERM GOAL #1   Title Independent with a HEP.    Time 4    Period Weeks    Status On-going      PT LONG TERM GOAL #2   Title Walk a community distance with pain not > 2-3/10.    Time 4    Period Weeks    Status On-going      PT LONG TERM GOAL #3   Title Eliminate left LE symptoms.    Time 4    Period Weeks    Status On-going      PT LONG TERM GOAL #4   Title Perform ADL's with pain not > 2/10.    Time 6    Status On-going      PT LONG TERM GOAL #5   Title Perform a reciprocating stair gait with one railing with pain not > 2-3/10.    Time 6    Period Weeks    Status On-going                   Plan - 10/20/20 [id number]   Clinical Impression Statement Patient tolerated treatment well today. patient able to progress with stretches and strengthening exericses to stabilize and realive SI joint pain on left side of patient today. Patient continues to have 7-8/10 pain with prolong walking activity. Good response today. Goals ongoing today.    Personal Factors and Comorbidities Comorbidity 1;Other    Comorbidities DM, HTN, Hernia repair, right total knee replacement.    Examination-Activity Limitations Locomotion Level;Stand;Other    Examination-Participation Restrictions Other    Stability/Clinical Decision Making Stable/Uncomplicated    Rehab Potential Excellent    PT Frequency 2x / week    PT Duration 4 weeks    PT Treatment/Interventions ADLs/Self Care Home Management;Cryotherapy;Electrical Stimulation;Moist Heat;Gait training;Stair training;Functional mobility training;Therapeutic activities;Therapeutic exercise;Manual techniques;Patient/family education;Passive range of motion;Traction    PT Next Visit Plan Modalities and STW/M, core exercise progression.  May consider int lumbar traction beginning at 35% body weight.    Consulted and Agree with Plan of Care Patient              Patient will benefit from skilled therapeutic intervention in order to improve the following deficits  and impairments:  Pain, Increased muscle spasms, Decreased activity tolerance  Visit Diagnosis: Acute left-sided low back pain with left-sided sciatica     Problem List Patient Active Problem List   Diagnosis Date Noted   S/P TKR (total knee replacement), right 08/16/2020   Primary osteoarthritis of right knee    Erectile dysfunction following radical prostatectomy 08/18/2013    Phillips Climes, PTA 10/20/2020, 9:07 AM  [facility] [address] [place], [place], [address] Phone: [phone]   Fax:  [phone]  Name: Robert Bishop MRN: [id number] Date of Birth: 03/12/1945

## 2020-10-24 ENCOUNTER — Other Ambulatory Visit: Payer: Self-pay

## 2020-10-24 ENCOUNTER — Ambulatory Visit: Payer: Medicare Other

## 2020-10-24 DIAGNOSIS — M5442 Lumbago with sciatica, left side: Secondary | ICD-10-CM

## 2020-10-24 DIAGNOSIS — G8929 Other chronic pain: Secondary | ICD-10-CM | POA: Diagnosis not present

## 2020-10-24 DIAGNOSIS — M25561 Pain in right knee: Secondary | ICD-10-CM | POA: Diagnosis not present

## 2020-10-24 DIAGNOSIS — R600 Localized edema: Secondary | ICD-10-CM | POA: Diagnosis not present

## 2020-10-24 DIAGNOSIS — M25661 Stiffness of right knee, not elsewhere classified: Secondary | ICD-10-CM | POA: Diagnosis not present

## 2020-10-24 NOTE — Therapy (Signed)
[facility] [place], [place], [address] Phone: [phone]   Fax:  [phone]  Physical Therapy Treatment  Patient Details  Name: Robert Bishop MRN: [id number] Date of Birth: 03-07-1945 Referring Provider (PT): Arther Abbott MD   Encounter Date: 10/24/2020   PT End of Session - 10/24/20 0839     Visit Number 3    Number of Visits 8    Date for PT Re-Evaluation 11/22/20    Authorization Type FOTO 12th score 99  PROGRESS NOTE AT 10TH VISIT.  KX MODIFIER AFTER 15 VISITS.    PT Start Time 0815    PT Stop Time 0900    PT Time Calculation (min) 45 min    Activity Tolerance Patient tolerated treatment well    Behavior During Therapy WFL for tasks assessed/performed             Past Medical History:  Diagnosis Date   Anemia    Diabetes mellitus without complication (HCC)    GERD (gastroesophageal reflux disease)    pt states no problem since hernia repair   H/O hiatal hernia    Hematuria    History of blood transfusion yrs ago   Hypertension    Lesion of bladder    Wears glasses     Past Surgical History:  Procedure Laterality Date   CYSTOSCOPY N/A 08/10/2014   Procedure: CYSTOSCOPY FLEXIBLE AND REMOVAL OF FOREIGN BODY ( Vascular Clip);  Surgeon: Irine Seal, MD;  Location: [facility];  Service: Urology;  Laterality: N/A;   CYSTOSCOPY WITH BIOPSY N/A 06/12/2012   Procedure: CYSTOSCOPY BLADDER BIOPSY WITH FULGURATION;  Surgeon: Malka So, MD;  Location: [facility];  Service: Urology;  Laterality: N/A;   HERNIA REPAIR     hiatal   PENILE PROSTHESIS IMPLANT N/A 08/18/2013   Procedure: INSERTION OF COLOPLAST TITAN PENILE PROSTHESIS INFRAPUBIC APPROACH;  Surgeon: Malka So, MD;  Location: [facility];  Service: Urology;  Laterality: N/A;   ROBOT ASSISTED LAPAROSCOPIC RADICAL PROSTATECTOMY N/A 08/06/2012   Procedure: ROBOTIC ASSISTED LAPAROSCOPIC RADICAL PROSTATECTOMY WITH NODE DISSECTION AND LEFT NERVE  SPARE ONLY;  Surgeon: Malka So, MD;  Location: [facility];  Service: Urology;  Laterality: N/A;   TOTAL KNEE ARTHROPLASTY Right 08/16/2020   Procedure: RIGHT TOTAL KNEE ARTHROPLASTY;  Surgeon: Carole Civil, MD;  Location: [facility];  Service: Orthopedics;  Laterality: Right;    There were no vitals filed for this visit.   Subjective Assessment - 10/24/20 0818     Subjective COVID-19 screen performed prior to patient entering clinic.  Patient arrived with reports of mild pain int he L hip region.    Pertinent History DM, HTN, Hernia repair.    Patient Stated Goals Get back to normal.    Currently in Pain? Yes    Pain Score 3     Pain Location Back    Pain Orientation Left    Pain Descriptors / Indicators Discomfort    Pain Frequency Intermittent                               [facility] Adult PT Treatment/Exercise - 10/24/20 0001       Exercises   Exercises Lumbar      Lumbar Exercises: Stretches   Active Hamstring Stretch Limitations seated hasmstring stretch 3 x 10sec    Double Knee to Chest Stretch 2 reps;60 seconds   feet on physioball  Lower Trunk Rotation 3 reps;60 seconds    Piriformis Stretch 3 reps;10 seconds      Lumbar Exercises: Aerobic   Nustep L4 x35mn UE/LE with core focus      Lumbar Exercises: Supine   Ab Set 15 reps;5 seconds    AB Set Limitations supine physiball press down into knees    Bridge 20 reps    Bridge Limitations with red tband at knees      Lumbar Exercises: Sidelying   Clam Both;20 reps   INVERSE clam shells with red tband at ankles     Lumbar Exercises: Quadruped   Other Quadruped Lumbar Exercises primal push ups x 8 hold 5 secs      Manual Therapy   Manual Therapy Joint mobilization;Soft tissue mobilization    Manual therapy comments reduced L hip flexor and lumbar paraspinal restriction with posterior illium mob (lat-inferior) with compression at L3-L4    Joint Mobilization gr II spinal mob L1-L5    Soft tissue  mobilization STM at Lumbar paraspinal and QL region                         PT Long Term Goals - 10/20/20 [id number]      PT LONG TERM GOAL #1   Title Independent with a HEP.    Time 4    Period Weeks    Status On-going      PT LONG TERM GOAL #2   Title Walk a community distance with pain not > 2-3/10.    Time 4    Period Weeks    Status On-going      PT LONG TERM GOAL #3   Title Eliminate left LE symptoms.    Time 4    Period Weeks    Status On-going      PT LONG TERM GOAL #4   Title Perform ADL's with pain not > 2/10.    Time 6    Status On-going      PT LONG TERM GOAL #5   Title Perform a reciprocating stair gait with one railing with pain not > 2-3/10.    Time 6    Period Weeks    Status On-going                   Plan - 10/24/20 [id number]    Clinical Impression Statement Patient with excellent participation in PT this visit. He Demonstrates great mobility with encouraged stretches and exercises provided with minimal verbal cues needed. He was able to progress with core strength items this visit without limitation. Manual therapy emphasized reducing posterior chain and lumbobpelvic myofascial restrictions. SKilled PT recommeneded to encourage increased tolerance with equal weightbearing gait and mobility to reduce pain and deficits.    Personal Factors and Comorbidities Comorbidity 1;Other    Comorbidities DM, HTN, Hernia repair, right total knee replacement.    Examination-Activity Limitations Locomotion Level;Stand;Other    Examination-Participation Restrictions Other    Stability/Clinical Decision Making Stable/Uncomplicated    Clinical Decision Making Low    Rehab Potential Excellent    PT Frequency 2x / week    PT Duration 4 weeks    PT Treatment/Interventions ADLs/Self Care Home Management;Cryotherapy;Electrical Stimulation;Moist Heat;Gait training;Stair training;Functional mobility training;Therapeutic activities;Therapeutic exercise;Manual  techniques;Patient/family education;Passive range of motion;Traction    PT Next Visit Plan Modalities and STW/M, core exercise progression.  May consider int lumbar traction beginning at 35% body weight.    Consulted and Agree with  Plan of Care Patient             Patient will benefit from skilled therapeutic intervention in order to improve the following deficits and impairments:  Pain, Increased muscle spasms, Decreased activity tolerance  Visit Diagnosis: Acute left-sided low back pain with left-sided sciatica  Localized edema  Chronic pain of right knee  Stiffness of right knee, not elsewhere classified     Problem List Patient Active Problem List   Diagnosis Date Noted   S/P TKR (total knee replacement), right 08/16/2020   Primary osteoarthritis of right knee    Erectile dysfunction following radical prostatectomy 08/18/2013    Marylou Mccoy PT, DPT 10/24/2020, 9:10 AM  [facility] [address] [place], [place], [address] Phone: [phone]   Fax:  [phone]  Name: Robert Bishop MRN: [id number] Date of Birth: [birth year]

## 2020-10-26 ENCOUNTER — Other Ambulatory Visit: Payer: Self-pay

## 2020-10-26 ENCOUNTER — Ambulatory Visit: Payer: Medicare Other | Admitting: Physical Therapy

## 2020-10-26 DIAGNOSIS — R600 Localized edema: Secondary | ICD-10-CM | POA: Diagnosis not present

## 2020-10-26 DIAGNOSIS — Z7984 Long term (current) use of oral hypoglycemic drugs: Secondary | ICD-10-CM | POA: Diagnosis not present

## 2020-10-26 DIAGNOSIS — E7849 Other hyperlipidemia: Secondary | ICD-10-CM | POA: Diagnosis not present

## 2020-10-26 DIAGNOSIS — G8929 Other chronic pain: Secondary | ICD-10-CM | POA: Diagnosis not present

## 2020-10-26 DIAGNOSIS — M5442 Lumbago with sciatica, left side: Secondary | ICD-10-CM

## 2020-10-26 DIAGNOSIS — M25561 Pain in right knee: Secondary | ICD-10-CM | POA: Diagnosis not present

## 2020-10-26 DIAGNOSIS — E1169 Type 2 diabetes mellitus with other specified complication: Secondary | ICD-10-CM | POA: Diagnosis not present

## 2020-10-26 DIAGNOSIS — I10 Essential (primary) hypertension: Secondary | ICD-10-CM | POA: Diagnosis not present

## 2020-10-26 DIAGNOSIS — K219 Gastro-esophageal reflux disease without esophagitis: Secondary | ICD-10-CM | POA: Diagnosis not present

## 2020-10-26 DIAGNOSIS — M25661 Stiffness of right knee, not elsewhere classified: Secondary | ICD-10-CM | POA: Diagnosis not present

## 2020-10-26 NOTE — Therapy (Signed)
[facility] [place], [place], [address] Phone: [phone]   Fax:  [phone]  Physical Therapy Treatment  Patient Details  Name: Robert Bishop MRN: [id number] Date of Birth: 05/22/1945 Referring Provider (PT): Arther Abbott MD   Encounter Date: 10/26/2020   PT End of Session - 10/26/20 0849     Visit Number 4    Number of Visits 8    Date for PT Re-Evaluation 11/22/20    Authorization Type FOTO 12th score 99  PROGRESS NOTE AT 10TH VISIT.  KX MODIFIER AFTER 15 VISITS.    PT Start Time 0814    PT Stop Time 0907    PT Time Calculation (min) 53 min    Activity Tolerance Patient tolerated treatment well    Behavior During Therapy WFL for tasks assessed/performed             Past Medical History:  Diagnosis Date   Anemia    Diabetes mellitus without complication (HCC)    GERD (gastroesophageal reflux disease)    pt states no problem since hernia repair   H/O hiatal hernia    Hematuria    History of blood transfusion yrs ago   Hypertension    Lesion of bladder    Wears glasses     Past Surgical History:  Procedure Laterality Date   CYSTOSCOPY N/A 08/10/2014   Procedure: CYSTOSCOPY FLEXIBLE AND REMOVAL OF FOREIGN BODY ( Vascular Clip);  Surgeon: Irine Seal, MD;  Location: [facility];  Service: Urology;  Laterality: N/A;   CYSTOSCOPY WITH BIOPSY N/A 06/12/2012   Procedure: CYSTOSCOPY BLADDER BIOPSY WITH FULGURATION;  Surgeon: Malka So, MD;  Location: [facility];  Service: Urology;  Laterality: N/A;   HERNIA REPAIR     hiatal   PENILE PROSTHESIS IMPLANT N/A 08/18/2013   Procedure: INSERTION OF COLOPLAST TITAN PENILE PROSTHESIS INFRAPUBIC APPROACH;  Surgeon: Malka So, MD;  Location: [facility];  Service: Urology;  Laterality: N/A;   ROBOT ASSISTED LAPAROSCOPIC RADICAL PROSTATECTOMY N/A 08/06/2012   Procedure: ROBOTIC ASSISTED LAPAROSCOPIC RADICAL PROSTATECTOMY WITH NODE DISSECTION AND LEFT NERVE  SPARE ONLY;  Surgeon: Malka So, MD;  Location: [facility];  Service: Urology;  Laterality: N/A;   TOTAL KNEE ARTHROPLASTY Right 08/16/2020   Procedure: RIGHT TOTAL KNEE ARTHROPLASTY;  Surgeon: Carole Civil, MD;  Location: [facility];  Service: Orthopedics;  Laterality: Right;    There were no vitals filed for this visit.   Subjective Assessment - 10/26/20 0816     Subjective COVID-19 screen performed prior to patient entering clinic.  Patient reported feeling some better.    Pertinent History DM, HTN, Hernia repair.    Patient Stated Goals Get back to normal.    Currently in Pain? Yes    Pain Score 3     Pain Location Back    Pain Orientation Left    Pain Descriptors / Indicators Discomfort    Pain Type Surgical pain    Pain Onset 1 to 4 weeks ago    Pain Frequency Intermittent    Aggravating Factors  prolong activity    Pain Relieving Factors rest                               [facility] Adult PT Treatment/Exercise - 10/26/20 0001       Lumbar Exercises: Stretches   Active Hamstring Stretch Limitations seated hasmstring stretch 3 x 10sec  Double Knee to Chest Stretch 2 reps;30 seconds    Lower Trunk Rotation 3 reps;30 seconds    Piriformis Stretch 3 reps;10 seconds;Left      Lumbar Exercises: Aerobic   Nustep L4 x3mn UE/LE with core focus      Lumbar Exercises: Standing   Other Standing Lumbar Exercises latt pull with blue XTS x20      Lumbar Exercises: Supine   Ab Set 15 reps;5 seconds    AB Set Limitations supine physiball press down into knees    Bridge 20 reps    Bridge Limitations with red tband at knees    Straight Leg Raise 3 seconds   2x10     Lumbar Exercises: Sidelying   Clam Both;20 reps   with red band     Moist Heat Therapy   Number Minutes Moist Heat 15 Minutes    Moist Heat Location Lumbar Spine      Electrical Stimulation   Electrical Stimulation Location Left gluteal region.    Electrical Stimulation Action premod     Electrical Stimulation Parameters 80-'150hz'$  x129m    Electrical Stimulation Goals Pain                         PT Long Term Goals - 10/26/20 0818       PT LONG TERM GOAL #1   Title Independent with a HEP.    Baseline issued today 09/29/20    Time 4    Period Weeks    Status On-going      PT LONG TERM GOAL #2   Title Walk a community distance with pain not > 2-3/10.    Baseline Pain at 3-4/10 10/26/20    Time 4    Period Weeks    Status On-going      PT LONG TERM GOAL #3   Title Eliminate left LE symptoms.    Baseline less symptoms and less frequent 10/26/20    Time 4    Period Weeks    Status On-going      PT LONG TERM GOAL #4   Title --    Time --    Period --    Status --      PT LONG TERM GOAL #5   Title --    Time --    Period --    Status --                   Plan - 10/26/20 0844     Clinical Impression Statement Patient tolerated treatment well today. patient has reported less symptoms in left LE and less frequent. Patient is able to walk a community distance with 3-4/10 pain. Patient progressing with core exercises and left LE stretches. Goals progressing today.    Personal Factors and Comorbidities Comorbidity 1;Other    Comorbidities DM, HTN, Hernia repair, right total knee replacement.    Examination-Activity Limitations Locomotion Level;Stand;Other    Examination-Participation Restrictions Other    Stability/Clinical Decision Making Stable/Uncomplicated    Rehab Potential Excellent    PT Frequency 2x / week    PT Duration 4 weeks    PT Treatment/Interventions ADLs/Self Care Home Management;Cryotherapy;Electrical Stimulation;Moist Heat;Gait training;Stair training;Functional mobility training;Therapeutic activities;Therapeutic exercise;Manual techniques;Patient/family education;Passive range of motion;Traction    PT Next Visit Plan Modalities and STW/M, core exercise progression.  May consider int lumbar traction beginning at 35%  body weight.    Consulted and Agree with Plan of Care Patient  Patient will benefit from skilled therapeutic intervention in order to improve the following deficits and impairments:  Pain, Increased muscle spasms, Decreased activity tolerance  Visit Diagnosis: Acute left-sided low back pain with left-sided sciatica     Problem List Patient Active Problem List   Diagnosis Date Noted   S/P TKR (total knee replacement), right 08/16/2020   Primary osteoarthritis of right knee    Erectile dysfunction following radical prostatectomy 08/18/2013    Phillips Climes, PTA 10/26/2020, 9:11 AM  [facility] [address] [place], [place], [address] Phone: [phone]   Fax:  [phone]  Name: Robert Bishop MRN: [id number] Date of Birth: 1945/12/14

## 2020-11-01 ENCOUNTER — Other Ambulatory Visit: Payer: Self-pay

## 2020-11-01 ENCOUNTER — Ambulatory Visit: Payer: Medicare Other | Attending: Orthopedic Surgery | Admitting: Physical Therapy

## 2020-11-01 ENCOUNTER — Encounter: Payer: Self-pay | Admitting: Physical Therapy

## 2020-11-01 DIAGNOSIS — M5442 Lumbago with sciatica, left side: Secondary | ICD-10-CM | POA: Diagnosis not present

## 2020-11-01 DIAGNOSIS — R600 Localized edema: Secondary | ICD-10-CM | POA: Insufficient documentation

## 2020-11-01 NOTE — Therapy (Signed)
[facility] [place], [place], [address] Phone: [phone]   Fax:  [phone]  Physical Therapy Treatment  Patient Details  Name: Robert Bishop MRN: [id number] Date of Birth: [birth year] Referring Provider (PT): Arther Abbott MD   Encounter Date: 11/01/2020   PT End of Session - 11/01/20 0947     Visit Number 5    Number of Visits 8    Date for PT Re-Evaluation 11/22/20    Authorization Type FOTO 12th score 99  PROGRESS NOTE AT 10TH VISIT.  KX MODIFIER AFTER 15 VISITS.    PT Start Time [phone]    PT Stop Time 1033    PT Time Calculation (min) 46 min    Activity Tolerance Patient tolerated treatment well    Behavior During Therapy WFL for tasks assessed/performed             Past Medical History:  Diagnosis Date   Anemia    Diabetes mellitus without complication (HCC)    GERD (gastroesophageal reflux disease)    pt states no problem since hernia repair   H/O hiatal hernia    Hematuria    History of blood transfusion yrs ago   Hypertension    Lesion of bladder    Wears glasses     Past Surgical History:  Procedure Laterality Date   CYSTOSCOPY N/A 08/10/2014   Procedure: CYSTOSCOPY FLEXIBLE AND REMOVAL OF FOREIGN BODY ( Vascular Clip);  Surgeon: Irine Seal, MD;  Location: [facility];  Service: Urology;  Laterality: N/A;   CYSTOSCOPY WITH BIOPSY N/A 06/12/2012   Procedure: CYSTOSCOPY BLADDER BIOPSY WITH FULGURATION;  Surgeon: Malka So, MD;  Location: [facility];  Service: Urology;  Laterality: N/A;   HERNIA REPAIR     hiatal   PENILE PROSTHESIS IMPLANT N/A 08/18/2013   Procedure: INSERTION OF COLOPLAST TITAN PENILE PROSTHESIS INFRAPUBIC APPROACH;  Surgeon: Malka So, MD;  Location: [facility];  Service: Urology;  Laterality: N/A;   ROBOT ASSISTED LAPAROSCOPIC RADICAL PROSTATECTOMY N/A 08/06/2012   Procedure: ROBOTIC ASSISTED LAPAROSCOPIC RADICAL PROSTATECTOMY WITH NODE DISSECTION AND LEFT NERVE  SPARE ONLY;  Surgeon: Malka So, MD;  Location: [facility];  Service: Urology;  Laterality: N/A;   TOTAL KNEE ARTHROPLASTY Right 08/16/2020   Procedure: RIGHT TOTAL KNEE ARTHROPLASTY;  Surgeon: Carole Civil, MD;  Location: [facility];  Service: Orthopedics;  Laterality: Right;    There were no vitals filed for this visit.   Subjective Assessment - 11/01/20 0946     Subjective COVID-19 screen performed prior to patient entering clinic.  Patient reported feeling some better.    Pertinent History DM, HTN, Hernia repair.    Patient Stated Goals Get back to normal.    Currently in Pain? Yes    Pain Score 2     Pain Location Back    Pain Orientation Left;Lower    Pain Descriptors / Indicators Discomfort    Pain Type Surgical pain    Pain Onset 1 to 4 weeks ago    Pain Frequency Intermittent                [facility] PT Assessment - 11/01/20 0001       Assessment   Medical Diagnosis Right total knee replacement.    Referring Provider (PT) Arther Abbott MD    Onset Date/Surgical Date 08/16/20    Next MD Visit 11/24/2020      Precautions   Precaution Comments No ultrasound.  Restrictions   Weight Bearing Restrictions No                           [facility] Adult PT Treatment/Exercise - 11/01/20 0001       Lumbar Exercises: Stretches   Single Knee to Chest Stretch Left;3 reps;30 seconds    Lower Trunk Rotation 4 reps;10 seconds    Figure 4 Stretch 3 reps;30 seconds;Seated;Without overpressure      Lumbar Exercises: Aerobic   Nustep L4 x35mn UE/LE with core focus      Lumbar Exercises: Supine   Bridge 20 reps;5 seconds    Single Leg Bridge 20 reps;Limitations    Bridge with Ball Squeeze Limitations LLE      Lumbar Exercises: Sidelying   Clam Left;20 reps    Hip Abduction Left;20 reps      Modalities   Modalities Electrical Stimulation;Moist Heat      Moist Heat Therapy   Number Minutes Moist Heat 10 Minutes    Moist Heat Location Lumbar Spine       Electrical Stimulation   Electrical Stimulation Location Left gluteal region.    Electrical Stimulation Action Pre-Mod    Electrical Stimulation Parameters 80-150 hz x10 min    Electrical Stimulation Goals Pain                         PT Long Term Goals - 10/26/20 0818       PT LONG TERM GOAL #1   Title Independent with a HEP.    Baseline issued today 09/29/20    Time 4    Period Weeks    Status On-going      PT LONG TERM GOAL #2   Title Walk a community distance with pain not > 2-3/10.    Baseline Pain at 3-4/10 10/26/20    Time 4    Period Weeks    Status On-going      PT LONG TERM GOAL #3   Title Eliminate left LE symptoms.    Baseline less symptoms and less frequent 10/26/20    Time 4    Period Weeks    Status On-going      PT LONG TERM GOAL #4   Title --    Time --    Period --    Status --      PT LONG TERM GOAL #5   Title --    Time --    Period --    Status --                   Plan - 11/01/20 1051     Clinical Impression Statement Patient presented in clinic with reports of minimal L low back pain. Patient progressed through more stretching and L [facility] strengthening. Only reports of discomfort with SL hip abduction. Normal modalities response noted following removal of the modalities. Patient still feels like he is "walking drunk."    Personal Factors and Comorbidities Comorbidity 1;Other    Comorbidities DM, HTN, Hernia repair, right total knee replacement.    Examination-Activity Limitations Locomotion Level;Stand;Other    Examination-Participation Restrictions Other    Stability/Clinical Decision Making Stable/Uncomplicated    Rehab Potential Excellent    PT Frequency 2x / week    PT Duration 4 weeks    PT Treatment/Interventions ADLs/Self Care Home Management;Cryotherapy;Electrical Stimulation;Moist Heat;Gait training;Stair training;Functional mobility training;Therapeutic activities;Therapeutic exercise;Manual  techniques;Patient/family education;Passive range of motion;Traction  PT Next Visit Plan Modalities and STW/M, core exercise progression.  May consider int lumbar traction beginning at 35% body weight.    Consulted and Agree with Plan of Care Patient             Patient will benefit from skilled therapeutic intervention in order to improve the following deficits and impairments:  Pain, Increased muscle spasms, Decreased activity tolerance  Visit Diagnosis: Acute left-sided low back pain with left-sided sciatica  Localized edema     Problem List Patient Active Problem List   Diagnosis Date Noted   S/P TKR (total knee replacement), right 08/16/2020   Primary osteoarthritis of right knee    Erectile dysfunction following radical prostatectomy 08/18/2013    Standley Brooking, PTA 11/01/2020, 10:54 AM  [facility] [place], [place], [address] Phone: [phone]   Fax:  [phone]  Name: Dannon Canner MRN: [id number] Date of Birth: 12-06-1945

## 2020-11-07 ENCOUNTER — Ambulatory Visit: Payer: Medicare Other | Admitting: Physical Therapy

## 2020-11-07 ENCOUNTER — Other Ambulatory Visit: Payer: Self-pay

## 2020-11-07 DIAGNOSIS — R600 Localized edema: Secondary | ICD-10-CM | POA: Diagnosis not present

## 2020-11-07 DIAGNOSIS — M5442 Lumbago with sciatica, left side: Secondary | ICD-10-CM | POA: Diagnosis not present

## 2020-11-07 NOTE — Therapy (Signed)
[facility] [place], [place], [address] Phone: [phone]   Fax:  [phone]  Physical Therapy Treatment  Patient Details  Name: Robert Bishop MRN: [id number] Date of Birth: 1945/06/02 Referring Provider (PT): Arther Abbott MD   Encounter Date: 11/07/2020   PT End of Session - 11/07/20 0836     Visit Number 6    Number of Visits 8    Date for PT Re-Evaluation 11/22/20    Authorization Type FOTO 12th score 99  PROGRESS NOTE AT 10TH VISIT.  KX MODIFIER AFTER 15 VISITS.    PT Start Time 0827   late arrival   PT Stop Time 0905    PT Time Calculation (min) 38 min    Activity Tolerance Patient tolerated treatment well    Behavior During Therapy WFL for tasks assessed/performed             Past Medical History:  Diagnosis Date   Anemia    Diabetes mellitus without complication (HCC)    GERD (gastroesophageal reflux disease)    pt states no problem since hernia repair   H/O hiatal hernia    Hematuria    History of blood transfusion yrs ago   Hypertension    Lesion of bladder    Wears glasses     Past Surgical History:  Procedure Laterality Date   CYSTOSCOPY N/A 08/10/2014   Procedure: CYSTOSCOPY FLEXIBLE AND REMOVAL OF FOREIGN BODY ( Vascular Clip);  Surgeon: Irine Seal, MD;  Location: [facility];  Service: Urology;  Laterality: N/A;   CYSTOSCOPY WITH BIOPSY N/A 06/12/2012   Procedure: CYSTOSCOPY BLADDER BIOPSY WITH FULGURATION;  Surgeon: Malka So, MD;  Location: [facility];  Service: Urology;  Laterality: N/A;   HERNIA REPAIR     hiatal   PENILE PROSTHESIS IMPLANT N/A 08/18/2013   Procedure: INSERTION OF COLOPLAST TITAN PENILE PROSTHESIS INFRAPUBIC APPROACH;  Surgeon: Malka So, MD;  Location: [facility];  Service: Urology;  Laterality: N/A;   ROBOT ASSISTED LAPAROSCOPIC RADICAL PROSTATECTOMY N/A 08/06/2012   Procedure: ROBOTIC ASSISTED LAPAROSCOPIC RADICAL PROSTATECTOMY WITH NODE DISSECTION  AND LEFT NERVE SPARE ONLY;  Surgeon: Malka So, MD;  Location: [facility];  Service: Urology;  Laterality: N/A;   TOTAL KNEE ARTHROPLASTY Right 08/16/2020   Procedure: RIGHT TOTAL KNEE ARTHROPLASTY;  Surgeon: Carole Civil, MD;  Location: [facility];  Service: Orthopedics;  Laterality: Right;    There were no vitals filed for this visit.   Subjective Assessment - 11/07/20 0829     Subjective COVID-19 screen performed prior to patient entering clinic.  Patient arrived with very minimal discomfort.    Pertinent History DM, HTN, Hernia repair.    Patient Stated Goals Get back to normal.    Currently in Pain? Yes    Pain Score 1     Pain Location Back    Pain Orientation Left;Lower    Pain Descriptors / Indicators Discomfort    Pain Type Surgical pain    Pain Onset 1 to 4 weeks ago    Pain Frequency Intermittent    Aggravating Factors  prolong activity    Pain Relieving Factors at rest                               [facility] Adult PT Treatment/Exercise - 11/07/20 0001       Lumbar Exercises: Stretches   Single Knee to Chest Stretch Left;3 reps;30 seconds  Piriformis Stretch 3 reps;Left;20 seconds      Lumbar Exercises: Aerobic   Nustep L4 x74mn UE/LE with core focus      Lumbar Exercises: Supine   Bridge 20 reps;5 seconds    Bridge Limitations with red tband at knees      Lumbar Exercises: Sidelying   Clam Left;20 reps    Hip Abduction Left;20 reps      Moist Heat Therapy   Number Minutes Moist Heat 10 Minutes    Moist Heat Location Lumbar Spine      Electrical Stimulation   Electrical Stimulation Location Left gluteal region.    Electrical Stimulation Action premod    Electrical Stimulation Parameters 80-_0  x173m    Electrical Stimulation Goals Pain                          PT Long Term Goals - 11/07/20 0830       PT LONG TERM GOAL #1   Title Independent with a HEP.    Time 4    Period Weeks    Status On-going      PT  LONG TERM GOAL #2   Title Walk a community distance with pain not > 2-3/10.    Baseline 1/10 discomfort 11/07/20    Time 4    Period Weeks    Status Achieved      PT LONG TERM GOAL #3   Title Eliminate left LE symptoms.    Baseline no LE symptoms 11/07/20    Time 4    Period Weeks    Status Achieved                   Plan - 11/07/20 0837     Clinical Impression Statement Patient tolerated tretment well today. Patient has reported minimal discomfort overall and no more LE symptoms. Patient progressing with left low back strengthening and LE stretches to improve functional independence. Patient able to ambulate and perform ADL's  with greater ease. Patient progressing to meeting all goals. Met LTG #2 and #3 today.    Personal Factors and Comorbidities Comorbidity 1;Other    Comorbidities DM, HTN, Hernia repair, right total knee replacement.    Examination-Activity Limitations Locomotion Level;Stand;Other    Examination-Participation Restrictions Other    Stability/Clinical Decision Making Stable/Uncomplicated    Rehab Potential Excellent    PT Frequency 2x / week    PT Duration 4 weeks    PT Treatment/Interventions ADLs/Self Care Home Management;Cryotherapy;Electrical Stimulation;Moist Heat;Gait training;Stair training;Functional mobility training;Therapeutic activities;Therapeutic exercise;Manual techniques;Patient/family education;Passive range of motion;Traction    PT Next Visit Plan cont with POC est advanced HEP as needed and DC    Consulted and Agree with Plan of Care Patient             Patient will benefit from skilled therapeutic intervention in order to improve the following deficits and impairments:  Pain, Increased muscle spasms, Decreased activity tolerance  Visit Diagnosis: Acute left-sided low back pain with left-sided sciatica     Problem List Patient Active Problem List   Diagnosis Date Noted   S/P TKR (total knee replacement), right 08/16/2020    Primary osteoarthritis of right knee    Erectile dysfunction following radical prostatectomy 08/18/2013    DUPhillips ClimesPTA 11/07/2020, 9:10 AM  [facility]hone: [phone] Fax:  [phone]: Robert DonderoRN: [id number] of Birth: [birth year]

## 2020-11-10 ENCOUNTER — Other Ambulatory Visit: Payer: Self-pay

## 2020-11-10 ENCOUNTER — Ambulatory Visit: Payer: Medicare Other | Admitting: Physical Therapy

## 2020-11-10 DIAGNOSIS — M5442 Lumbago with sciatica, left side: Secondary | ICD-10-CM | POA: Diagnosis not present

## 2020-11-10 DIAGNOSIS — R600 Localized edema: Secondary | ICD-10-CM | POA: Diagnosis not present

## 2020-11-10 NOTE — Patient Instructions (Addendum)
  Scapular Retraction: Bilateral  Facing anchor, pull arms back, bringing shoulder blades together. Repeat _30___ times per set. Do __2-3__ sets per session. Do _2___ sessions per day.    Standing lat pull with theraband  Anchor bands higher than your head. Start with your arms straight out in front of you at shoulder height (or a little above).  Pull bands down next to your body and then slowly return to the starting position.  10-30 x1day    Bridging with Theraband  Begin by lying on your back with your knees bent and theraband around your knees. Tighten your abs by tilting your pelvis up and flattening your back on the mat. Squeeze your glutes and raise your hips off of the table towards the ceiling. Keeping your hips raised, pull your knees outward against the band. Relax your knees back to midline and slowly return your hips to the mat. Relax and Breathe 2x10 1x daily    SINGLE KNEE TO CHEST STRETCH - SKTC  While Lying on your back, raise your leg up and hold your thigh under your knee while gently pulling it towards your chest for a gentle stretch. Lower your leg down and repeat. 20-30sec hold x3 reps 1-2x day    PIRIFORMIS STRETCH MODIFIED 3  While lying on your back and leg crossed on top of your opposite knee, hold your knee with your opposite hand and bring your knee up and over across your midline towards your opposite shoulder for a stretch felt in the buttock. 20-30 sec hold 2-3 reps 1-2 x day

## 2020-11-10 NOTE — Therapy (Signed)
[facility] [place], [place], [address] Phone: [phone]   Fax:  [phone]  Physical Therapy Treatment  Patient Details  Name: Robert Bishop MRN: [id number] Date of Birth: November 30, 1945 Referring Provider (PT): Arther Abbott MD   Encounter Date: 11/10/2020   PT End of Session - 11/10/20 0822     Visit Number 7    Number of Visits 8    Date for PT Re-Evaluation 11/22/20    Authorization Type FOTO 12th score 99  PROGRESS NOTE AT 10TH VISIT.  KX MODIFIER AFTER 15 VISITS.    PT Start Time 0815    PT Stop Time 0857    PT Time Calculation (min) 42 min    Activity Tolerance Patient tolerated treatment well    Behavior During Therapy WFL for tasks assessed/performed             Past Medical History:  Diagnosis Date   Anemia    Diabetes mellitus without complication (HCC)    GERD (gastroesophageal reflux disease)    pt states no problem since hernia repair   H/O hiatal hernia    Hematuria    History of blood transfusion yrs ago   Hypertension    Lesion of bladder    Wears glasses     Past Surgical History:  Procedure Laterality Date   CYSTOSCOPY N/A 08/10/2014   Procedure: CYSTOSCOPY FLEXIBLE AND REMOVAL OF FOREIGN BODY ( Vascular Clip);  Surgeon: Irine Seal, MD;  [facility];  Service: Urology;  Laterality: N/A;   CYSTOSCOPY WITH BIOPSY N/A 06/12/2012   Procedure: CYSTOSCOPY BLADDER BIOPSY WITH FULGURATION;  Surgeon: Malka So, MD;  Location: [facility];  Service: Urology;  Laterality: N/A;   HERNIA REPAIR     hiatal   PENILE PROSTHESIS IMPLANT N/A 08/18/2013   Procedure: INSERTION OF COLOPLAST TITAN PENILE PROSTHESIS INFRAPUBIC APPROACH;  Surgeon: Malka So, MD;  Location: [facility];  Service: Urology;  Laterality: N/A;   ROBOT ASSISTED LAPAROSCOPIC RADICAL PROSTATECTOMY N/A 08/06/2012   Procedure: ROBOTIC ASSISTED LAPAROSCOPIC RADICAL PROSTATECTOMY WITH NODE DISSECTION AND LEFT NERVE  SPARE ONLY;  Surgeon: Malka So, MD;  Location: [facility];  Service: Urology;  Laterality: N/A;   TOTAL KNEE ARTHROPLASTY Right 08/16/2020   Procedure: RIGHT TOTAL KNEE ARTHROPLASTY;  Surgeon: Carole Civil, MD;  Location: [facility];  Service: Orthopedics;  Laterality: Right;    There were no vitals filed for this visit.   Subjective Assessment - 11/10/20 0817     Subjective COVID-19 screen performed prior to patient entering clinic.  Patient reported doing very well and no new complaints.    Pertinent History DM, HTN, Hernia repair.    Patient Stated Goals Get back to normal.    Currently in Pain? Yes    Pain Score 1     Pain Location Back    Pain Orientation Left;Lower    Pain Descriptors / Indicators Discomfort    Pain Type Surgical pain    Pain Onset 1 to 4 weeks ago    Pain Frequency Intermittent    Aggravating Factors  prolong activity    Pain Relieving Factors rest                               [facility] Adult PT Treatment/Exercise - 11/10/20 0001       Lumbar Exercises: Stretches   Single Knee to Chest Stretch Left;3 reps;30 seconds  Piriformis Stretch 3 reps;Left;20 seconds      Lumbar Exercises: Aerobic   Nustep L4 x67mn UE/LE with core focus      Lumbar Exercises: Standing   Scapular Retraction Strengthening;Both;20 reps;Theraband    Theraband Level (Scapular Retraction) Level 2 (Red)    Shoulder Extension Strengthening;Both;20 reps;Theraband    Theraband Level (Shoulder Extension) Level 2 (Red)      Lumbar Exercises: Supine   Bridge 20 reps;5 seconds    Bridge Limitations with red tband at knees      Moist Heat Therapy   Number Minutes Moist Heat 10 Minutes    Moist Heat Location Lumbar Spine      Electrical Stimulation   Electrical Stimulation Location Left gluteal region.    Electrical Stimulation Action premod    Electrical Stimulation Parameters 80-150hz  x129m    Electrical Stimulation Goals Pain                      PT Education - 11/10/20 [id number]   Education Details HEP    Person(s) Educated Patient    Methods Explanation;Demonstration;Handout                 PT Long Term Goals - 11/10/20 0823       PT LONG TERM GOAL #1   Title Independent with a HEP.    Baseline issued today 11/10/20    Time 4    Period Weeks    Status Partially Met      PT LONG TERM GOAL #2   Title Walk a community distance with pain not > 2-3/10.    Baseline 1/10 discomfort 11/07/20    Time 4    Period Weeks    Status Achieved      PT LONG TERM GOAL #3   Title Eliminate left LE symptoms.    Baseline no LE symptoms 11/07/20    Time 4    Period Weeks    Status Achieved                   Plan - 11/10/20 0841     Clinical Impression Statement Patient tolerated treatment well today. Patient has reported minimal discomfort. Patient given HEP today for home progression. Today reviewed HEP. Close to meeting all goals. Good response post treatment.    Personal Factors and Comorbidities Comorbidity 1;Other    Comorbidities DM, HTN, Hernia repair, right total knee replacement.    Examination-Activity Limitations Locomotion Level;Stand;Other    Examination-Participation Restrictions Other    Stability/Clinical Decision Making Stable/Uncomplicated    Rehab Potential Excellent    PT Frequency 2x / week    PT Duration 4 weeks    PT Treatment/Interventions ADLs/Self Care Home Management;Cryotherapy;Electrical Stimulation;Moist Heat;Gait training;Stair training;Functional mobility training;Therapeutic activities;Therapeutic exercise;Manual techniques;Patient/family education;Passive range of motion;Traction    PT Next Visit Plan Review and DC next visit.    Consulted and Agree with Plan of Care Patient             Patient will benefit from skilled therapeutic intervention in order to improve the following deficits and impairments:  Pain, Increased muscle spasms, Decreased activity  tolerance  Visit Diagnosis: Acute left-sided low back pain with left-sided sciatica     Problem List Patient Active Problem List   Diagnosis Date Noted   S/P TKR (total knee replacement), right 08/16/2020   Primary osteoarthritis of right knee    Erectile dysfunction following radical prostatectomy 08/18/2013    Jennise Both P, PTA  11/10/2020, 9:00 AM  [facility] [place], [place], [address] Phone: [phone]   Fax:  [phone]  Name: Montrelle Eddings MRN: [id number] Date of Birth: 27-Aug-1945

## 2020-11-14 ENCOUNTER — Other Ambulatory Visit: Payer: Self-pay

## 2020-11-14 ENCOUNTER — Other Ambulatory Visit: Payer: Self-pay | Admitting: Orthopedic Surgery

## 2020-11-14 ENCOUNTER — Ambulatory Visit: Payer: Medicare Other | Admitting: Physical Therapy

## 2020-11-14 DIAGNOSIS — M5442 Lumbago with sciatica, left side: Secondary | ICD-10-CM | POA: Diagnosis not present

## 2020-11-14 DIAGNOSIS — R600 Localized edema: Secondary | ICD-10-CM | POA: Diagnosis not present

## 2020-11-14 DIAGNOSIS — M1612 Unilateral primary osteoarthritis, left hip: Secondary | ICD-10-CM

## 2020-11-14 NOTE — Therapy (Signed)
[facility] [place], [place], [address] Phone: [phone]   Fax:  [phone]  Physical Therapy Treatment  Patient Details  Name: Robert Bishop MRN: [id number] Date of Birth: Apr 28, 1945 Referring Provider (PT): Arther Abbott MD   Encounter Date: 11/14/2020   PT End of Session - 11/14/20 0901     Visit Number 8    Number of Visits 8    Date for PT Re-Evaluation 11/22/20    Authorization Type FOTO 12th score 99  PROGRESS NOTE AT 10TH VISIT.  KX MODIFIER AFTER 15 VISITS.    PT Start Time 0900    PT Stop Time 0936    PT Time Calculation (min) 36 min    Activity Tolerance Patient tolerated treatment well    Behavior During Therapy WFL for tasks assessed/performed             Past Medical History:  Diagnosis Date   Anemia    Diabetes mellitus without complication (HCC)    GERD (gastroesophageal reflux disease)    pt states no problem since hernia repair   H/O hiatal hernia    Hematuria    History of blood transfusion yrs ago   Hypertension    Lesion of bladder    Wears glasses     Past Surgical History:  Procedure Laterality Date   CYSTOSCOPY N/A 08/10/2014   Procedure: CYSTOSCOPY FLEXIBLE AND REMOVAL OF FOREIGN BODY ( Vascular Clip);  Surgeon: Irine Seal, MD;  Location: [facility];  Service: Urology;  Laterality: N/A;   CYSTOSCOPY WITH BIOPSY N/A 06/12/2012   Procedure: CYSTOSCOPY BLADDER BIOPSY WITH FULGURATION;  Surgeon: Malka So, MD;  Location: [facility];  Service: Urology;  Laterality: N/A;   HERNIA REPAIR     hiatal   PENILE PROSTHESIS IMPLANT N/A 08/18/2013   Procedure: INSERTION OF COLOPLAST TITAN PENILE PROSTHESIS INFRAPUBIC APPROACH;  Surgeon: Malka So, MD;  Location: [facility];  Service: Urology;  Laterality: N/A;   ROBOT ASSISTED LAPAROSCOPIC RADICAL PROSTATECTOMY N/A 08/06/2012   Procedure: ROBOTIC ASSISTED LAPAROSCOPIC RADICAL PROSTATECTOMY WITH NODE DISSECTION AND LEFT NERVE  SPARE ONLY;  Surgeon: Malka So, MD;  Location: [facility];  Service: Urology;  Laterality: N/A;   TOTAL KNEE ARTHROPLASTY Right 08/16/2020   Procedure: RIGHT TOTAL KNEE ARTHROPLASTY;  Surgeon: Carole Civil, MD;  Location: [facility];  Service: Orthopedics;  Laterality: Right;    There were no vitals filed for this visit.   Subjective Assessment - 11/14/20 0901     Subjective COVID-19 screen performed prior to patient entering clinic.  Patient reported no pain.    Pertinent History DM, HTN, Hernia repair.    Patient Stated Goals Get back to normal.    Currently in Pain? No/denies                               [facility] Adult PT Treatment/Exercise - 11/14/20 0001       Lumbar Exercises: Aerobic   Nustep L4 x57mn UE/LE with core focus      Lumbar Exercises: Standing   Other Standing Lumbar Exercises latt pull and diagnols with blue XTS x20    Other Standing Lumbar Exercises 4# step outs 2x10      Lumbar Exercises: Supine   Bridge 20 reps;5 seconds    Bridge Limitations with red tband at knees      Moist Heat Therapy   Number Minutes Moist Heat 10  Minutes    Moist Heat Location Lumbar Spine      Electrical Stimulation   Electrical Stimulation Location Left gluteal region.    Electrical Stimulation Action premod    Electrical Stimulation Parameters 80-150hz  x28mn    Electrical Stimulation Goals Other (comment)   post exercises                         PT Long Term Goals - 11/14/20 0902       PT LONG TERM GOAL #1   Title Independent with a HEP.    Time 4    Period Weeks    Status Achieved      PT LONG TERM GOAL #2   Title Walk a community distance with pain not > 2-3/10.    Baseline 1/10 discomfort 11/07/20    Time 4    Period Weeks    Status Achieved      PT LONG TERM GOAL #3   Title Eliminate left LE symptoms.    Baseline no LE symptoms 11/07/20    Time 4    Period Weeks    Status Achieved                   Plan  - 11/14/20 0902     Clinical Impression Statement Patient has met all current goals. No pain or symtoms and independent with HEP.    Personal Factors and Comorbidities Comorbidity 1;Other    Comorbidities DM, HTN, Hernia repair, right total knee replacement.    Examination-Activity Limitations Locomotion Level;Stand;Other    Examination-Participation Restrictions Other    Stability/Clinical Decision Making Stable/Uncomplicated    Rehab Potential Excellent    PT Frequency 2x / week    PT Duration 4 weeks    PT Treatment/Interventions ADLs/Self Care Home Management;Cryotherapy;Electrical Stimulation;Moist Heat;Gait training;Stair training;Functional mobility training;Therapeutic activities;Therapeutic exercise;Manual techniques;Patient/family education;Passive range of motion;Traction    PT Next Visit Plan DC per PT    Consulted and Agree with Plan of Care Patient             Patient will benefit from skilled therapeutic intervention in order to improve the following deficits and impairments:  Pain, Increased muscle spasms, Decreased activity tolerance  Visit Diagnosis: Acute left-sided low back pain with left-sided sciatica     Problem List Patient Active Problem List   Diagnosis Date Noted   S/P TKR (total knee replacement), right 08/16/2020   Primary osteoarthritis of right knee    Erectile dysfunction following radical prostatectomy 08/18/2013    DPhillips Climes PTA 11/14/2020, 9:39 AM  [facility] [address]Phone: [phone]  Fax:  [phone] Name: Robert GarrisMRN: [id number] of Birth: 112-02-1945  PHYSICAL THERAPY DISCHARGE SUMMARY  Visits from Start of Care: 8.  Current functional level related to goals / functional outcomes: See above.   Remaining deficits: All goals met.   Education / Equipment: HEP.   Patient agrees to discharge. Patient goals were met.  Patient is being discharged due to meeting the stated rehab goals.    CMaliApplegate MPT

## 2020-11-24 ENCOUNTER — Ambulatory Visit (INDEPENDENT_AMBULATORY_CARE_PROVIDER_SITE_OTHER): Payer: Medicare Other | Admitting: Orthopedic Surgery

## 2020-11-24 ENCOUNTER — Other Ambulatory Visit: Payer: Self-pay

## 2020-11-24 ENCOUNTER — Encounter: Payer: Self-pay | Admitting: Orthopedic Surgery

## 2020-11-24 VITALS — BP 149/82 | HR 61 | Ht 68.0 in | Wt 213.0 lb

## 2020-11-24 DIAGNOSIS — M5410 Radiculopathy, site unspecified: Secondary | ICD-10-CM

## 2020-11-24 DIAGNOSIS — M47816 Spondylosis without myelopathy or radiculopathy, lumbar region: Secondary | ICD-10-CM

## 2020-11-24 DIAGNOSIS — Z96651 Presence of right artificial knee joint: Secondary | ICD-10-CM | POA: Diagnosis not present

## 2020-11-24 NOTE — Progress Notes (Addendum)
Chief Complaint  Patient presents with   Back Pain    Left sided radicular pain      Encounter Diagnoses  Name Primary?   Status post total right knee replacement August 16, 2020 Yes   Spondylosis of lumbar spine    Radicular leg pain-left      Mr. Robert Bishop is here for follow-up after 6 weeks of treatment with gabapentin for left leg radiculopathy  He is very happy with his right total knee well he does have some giving way episodes on the left leg on occasion  .  The gabapentin he will be on Celebrex and Norco for pain  Follow-up in 4 weeks to check on the giving way of the left leg

## 2020-11-30 DIAGNOSIS — Z8546 Personal history of malignant neoplasm of prostate: Secondary | ICD-10-CM | POA: Diagnosis not present

## 2020-12-07 DIAGNOSIS — Z8546 Personal history of malignant neoplasm of prostate: Secondary | ICD-10-CM | POA: Diagnosis not present

## 2020-12-07 DIAGNOSIS — R8271 Bacteriuria: Secondary | ICD-10-CM | POA: Diagnosis not present

## 2020-12-07 DIAGNOSIS — N5231 Erectile dysfunction following radical prostatectomy: Secondary | ICD-10-CM | POA: Diagnosis not present

## 2020-12-07 DIAGNOSIS — N3946 Mixed incontinence: Secondary | ICD-10-CM | POA: Diagnosis not present

## 2020-12-11 ENCOUNTER — Other Ambulatory Visit: Payer: Self-pay | Admitting: Orthopedic Surgery

## 2020-12-11 DIAGNOSIS — M1612 Unilateral primary osteoarthritis, left hip: Secondary | ICD-10-CM

## 2020-12-15 ENCOUNTER — Other Ambulatory Visit: Payer: Self-pay | Admitting: Orthopedic Surgery

## 2020-12-16 ENCOUNTER — Other Ambulatory Visit: Payer: Self-pay | Admitting: Orthopedic Surgery

## 2020-12-16 DIAGNOSIS — M1612 Unilateral primary osteoarthritis, left hip: Secondary | ICD-10-CM

## 2020-12-16 NOTE — Telephone Encounter (Signed)
Patient came in the office and states he can get his prescriptions cheaper with [facility]   He needs the following medicine filled    celecoxib (CELEBREX) 200 MG capsule  And   HYDROcodone-acetaminophen (NORCO) 7.5-325 MG tablet  FILL THRU [facility]

## 2020-12-17 ENCOUNTER — Other Ambulatory Visit: Payer: Self-pay | Admitting: Orthopedic Surgery

## 2020-12-17 MED ORDER — CELECOXIB 200 MG PO CAPS: 200.0000 mg | ORAL_CAPSULE | Freq: Two times a day (BID) | ORAL | 1 refills | Status: DC

## 2020-12-17 MED ORDER — HYDROCODONE-ACETAMINOPHEN 5-325 MG PO TABS: 1.0000 | ORAL_TABLET | Freq: Four times a day (QID) | ORAL | 0 refills | Status: AC | PRN

## 2020-12-22 ENCOUNTER — Encounter: Payer: Self-pay | Admitting: Orthopedic Surgery

## 2020-12-22 ENCOUNTER — Other Ambulatory Visit: Payer: Self-pay

## 2020-12-22 ENCOUNTER — Ambulatory Visit (INDEPENDENT_AMBULATORY_CARE_PROVIDER_SITE_OTHER): Payer: Medicare Other | Admitting: Orthopedic Surgery

## 2020-12-22 VITALS — BP 143/114 | HR 67 | Ht 68.0 in | Wt 204.0 lb

## 2020-12-22 DIAGNOSIS — M5410 Radiculopathy, site unspecified: Secondary | ICD-10-CM | POA: Diagnosis not present

## 2020-12-22 DIAGNOSIS — M47816 Spondylosis without myelopathy or radiculopathy, lumbar region: Secondary | ICD-10-CM | POA: Diagnosis not present

## 2020-12-22 DIAGNOSIS — Z96651 Presence of right artificial knee joint: Secondary | ICD-10-CM

## 2020-12-22 DIAGNOSIS — M5432 Sciatica, left side: Secondary | ICD-10-CM | POA: Diagnosis not present

## 2020-12-22 DIAGNOSIS — M1612 Unilateral primary osteoarthritis, left hip: Secondary | ICD-10-CM

## 2020-12-22 MED ORDER — GABAPENTIN 100 MG PO CAPS: 100.0000 mg | ORAL_CAPSULE | Freq: Three times a day (TID) | ORAL | 2 refills | Status: AC

## 2020-12-22 MED ORDER — HYDROCODONE-ACETAMINOPHEN 5-325 MG PO TABS: 1.0000 | ORAL_TABLET | Freq: Three times a day (TID) | ORAL | 0 refills | Status: AC | PRN

## 2020-12-22 MED ORDER — CELECOXIB 200 MG PO CAPS: 200.0000 mg | ORAL_CAPSULE | Freq: Two times a day (BID) | ORAL | 1 refills | Status: DC

## 2020-12-22 NOTE — Progress Notes (Signed)
Chief Complaint  Patient presents with   Back Pain   Medication Refill    Celebrex and Hydrocodone patient wants sent to Mail order   Mr. Goncalves did well with his right total knee he is happy with that  However postoperatively he developed lower back pain with left leg radicular symptoms probably L4 nerve root  His spinal images were consistent with spondylosis  We gave him some gabapentin hydrocodone and he seemed to get better his pain is decreasing but he still having some symptoms  He would like refills on hydrocodone 7.5 and Celebrex 200 mg twice a day  His examination is consistent with tenderness in the lower back with a negative straight leg raise and no weakness or sensory changes  I am going to refill his Neurontin 100 mg 3 times a day Celebrex 200 mg twice a day and decrease his hydrocodone to 5 mg Q8 only 15 tablets and then have him go on an opioid holiday.   Images of the lumbar spine   Shooting pain left lower back and hip down to the left knee   Images show a slight scoliosis in the lumbar spine and then he has moderate to severe facet arthritis in the L4-S1 region the disc spaces appear to be relatively open   Impression lumbar spondylosis moderate  Meds ordered this encounter  Medications   HYDROcodone-acetaminophen (NORCO/VICODIN) 5-325 MG tablet    Sig: Take 1 tablet by mouth every 8 (eight) hours as needed for up to 15 doses for moderate pain.    Dispense:  15 tablet    Refill:  0   gabapentin (NEURONTIN) 100 MG capsule    Sig: Take 1 capsule (100 mg total) by mouth 3 (three) times daily.    Dispense:  270 capsule    Refill:  2   celecoxib (CELEBREX) 200 MG capsule    Sig: Take 1 capsule (200 mg total) by mouth 2 (two) times daily.    Dispense:  180 capsule    Refill:  1

## 2020-12-26 ENCOUNTER — Other Ambulatory Visit: Payer: Self-pay | Admitting: Orthopedic Surgery

## 2020-12-27 ENCOUNTER — Telehealth: Payer: Self-pay | Admitting: Orthopedic Surgery

## 2020-12-27 NOTE — Telephone Encounter (Signed)
Patient came to office to follow up on his request for refill of  celecoxib (CELEBREX) 200 MG capsule     - states he is trying  to have it filled for 90 day supply through [facility]   - please advise

## 2021-01-28 DIAGNOSIS — Z23 Encounter for immunization: Secondary | ICD-10-CM | POA: Diagnosis not present

## 2021-02-09 ENCOUNTER — Ambulatory Visit: Payer: Medicare Other | Admitting: Orthopedic Surgery

## 2021-02-24 DIAGNOSIS — E7849 Other hyperlipidemia: Secondary | ICD-10-CM | POA: Diagnosis not present

## 2021-02-24 DIAGNOSIS — I10 Essential (primary) hypertension: Secondary | ICD-10-CM | POA: Diagnosis not present

## 2021-02-24 DIAGNOSIS — E1169 Type 2 diabetes mellitus with other specified complication: Secondary | ICD-10-CM | POA: Diagnosis not present

## 2021-02-24 DIAGNOSIS — K219 Gastro-esophageal reflux disease without esophagitis: Secondary | ICD-10-CM | POA: Diagnosis not present

## 2021-02-24 DIAGNOSIS — Z7984 Long term (current) use of oral hypoglycemic drugs: Secondary | ICD-10-CM | POA: Diagnosis not present

## 2021-03-21 DIAGNOSIS — E7800 Pure hypercholesterolemia, unspecified: Secondary | ICD-10-CM | POA: Diagnosis not present

## 2021-03-21 DIAGNOSIS — E1169 Type 2 diabetes mellitus with other specified complication: Secondary | ICD-10-CM | POA: Diagnosis not present

## 2021-03-21 DIAGNOSIS — C61 Malignant neoplasm of prostate: Secondary | ICD-10-CM | POA: Diagnosis not present

## 2021-03-21 DIAGNOSIS — E7849 Other hyperlipidemia: Secondary | ICD-10-CM | POA: Diagnosis not present

## 2021-03-21 DIAGNOSIS — Z1329 Encounter for screening for other suspected endocrine disorder: Secondary | ICD-10-CM | POA: Diagnosis not present

## 2021-03-21 DIAGNOSIS — E7801 Familial hypercholesterolemia: Secondary | ICD-10-CM | POA: Diagnosis not present

## 2021-03-21 DIAGNOSIS — E782 Mixed hyperlipidemia: Secondary | ICD-10-CM | POA: Diagnosis not present

## 2021-03-24 DIAGNOSIS — Z96651 Presence of right artificial knee joint: Secondary | ICD-10-CM | POA: Diagnosis not present

## 2021-03-24 DIAGNOSIS — E7849 Other hyperlipidemia: Secondary | ICD-10-CM | POA: Diagnosis not present

## 2021-03-24 DIAGNOSIS — Z0001 Encounter for general adult medical examination with abnormal findings: Secondary | ICD-10-CM | POA: Diagnosis not present

## 2021-03-24 DIAGNOSIS — I10 Essential (primary) hypertension: Secondary | ICD-10-CM | POA: Diagnosis not present

## 2021-03-24 DIAGNOSIS — C61 Malignant neoplasm of prostate: Secondary | ICD-10-CM | POA: Diagnosis not present

## 2021-03-24 DIAGNOSIS — K5730 Diverticulosis of large intestine without perforation or abscess without bleeding: Secondary | ICD-10-CM | POA: Diagnosis not present

## 2021-03-24 DIAGNOSIS — D126 Benign neoplasm of colon, unspecified: Secondary | ICD-10-CM | POA: Diagnosis not present

## 2021-03-24 DIAGNOSIS — E1169 Type 2 diabetes mellitus with other specified complication: Secondary | ICD-10-CM | POA: Diagnosis not present

## 2021-03-26 DIAGNOSIS — E7849 Other hyperlipidemia: Secondary | ICD-10-CM | POA: Diagnosis not present

## 2021-03-26 DIAGNOSIS — I10 Essential (primary) hypertension: Secondary | ICD-10-CM | POA: Diagnosis not present

## 2021-03-26 DIAGNOSIS — E1169 Type 2 diabetes mellitus with other specified complication: Secondary | ICD-10-CM | POA: Diagnosis not present

## 2021-06-15 ENCOUNTER — Other Ambulatory Visit: Payer: Self-pay | Admitting: Orthopedic Surgery

## 2021-06-15 DIAGNOSIS — M5410 Radiculopathy, site unspecified: Secondary | ICD-10-CM

## 2021-06-25 DIAGNOSIS — K219 Gastro-esophageal reflux disease without esophagitis: Secondary | ICD-10-CM | POA: Diagnosis not present

## 2021-06-25 DIAGNOSIS — E7849 Other hyperlipidemia: Secondary | ICD-10-CM | POA: Diagnosis not present

## 2021-06-25 DIAGNOSIS — Z7984 Long term (current) use of oral hypoglycemic drugs: Secondary | ICD-10-CM | POA: Diagnosis not present

## 2021-06-25 DIAGNOSIS — E1169 Type 2 diabetes mellitus with other specified complication: Secondary | ICD-10-CM | POA: Diagnosis not present

## 2021-06-25 DIAGNOSIS — I10 Essential (primary) hypertension: Secondary | ICD-10-CM | POA: Diagnosis not present

## 2021-07-26 ENCOUNTER — Telehealth: Payer: Self-pay | Admitting: Orthopedic Surgery

## 2021-07-26 ENCOUNTER — Other Ambulatory Visit: Payer: Self-pay | Admitting: Orthopedic Surgery

## 2021-07-26 DIAGNOSIS — M5410 Radiculopathy, site unspecified: Secondary | ICD-10-CM

## 2021-07-26 MED ORDER — CELECOXIB 200 MG PO CAPS: 200.0000 mg | ORAL_CAPSULE | Freq: Two times a day (BID) | ORAL | 3 refills | Status: AC

## 2021-07-26 NOTE — Telephone Encounter (Signed)
Patient came in the office wanting to know if Dr. Aline Brochure wants him to continue taking the medicine below.  celecoxib (CELEBREX) 200 MG capsule  Pharmacy:  [facility].  Please call the patient back and advise him what Dr. Aline Brochure says at [phone]

## 2021-07-26 NOTE — Telephone Encounter (Signed)
Yes I sent in a 1 yr supply to [facility]

## 2021-07-26 NOTE — Progress Notes (Signed)
Meds ordered this encounter  Medications   celecoxib (CELEBREX) 200 MG capsule    Sig: Take 1 capsule (200 mg total) by mouth 2 (two) times daily.    Dispense:  180 capsule    Refill:  3    Requesting 1 year supply

## 2021-07-26 NOTE — Telephone Encounter (Signed)
Called  him to advise he has voiced understanding

## 2021-08-03 ENCOUNTER — Encounter: Payer: Self-pay | Admitting: Orthopedic Surgery

## 2021-08-03 ENCOUNTER — Ambulatory Visit (INDEPENDENT_AMBULATORY_CARE_PROVIDER_SITE_OTHER): Payer: Medicare Other | Admitting: Orthopedic Surgery

## 2021-08-03 ENCOUNTER — Ambulatory Visit (INDEPENDENT_AMBULATORY_CARE_PROVIDER_SITE_OTHER): Payer: Medicare Other

## 2021-08-03 DIAGNOSIS — M1711 Unilateral primary osteoarthritis, right knee: Secondary | ICD-10-CM | POA: Diagnosis not present

## 2021-08-03 DIAGNOSIS — Z96651 Presence of right artificial knee joint: Secondary | ICD-10-CM

## 2021-08-03 NOTE — Progress Notes (Signed)
Chief Complaint  Patient presents with   Post-op Follow-up    Right knee 08/16/20 has trouble squatting    Robert Bishop is 1 year out from his right total knee.  He is up to his normal walking distance and his arthritis pain is much better.  His only problem is that he cannot squat or kneel  His range of motion has been maintained at 0 to 120 degrees  His incision looks great as well  X-rays show normal alignment   He is taking some Celebrex he can continue the Celebrex as needed   Follow-up  2 yrs repeat x-ray

## 2021-08-30 ENCOUNTER — Encounter: Payer: Medicare Other | Admitting: Orthopedic Surgery

## 2021-10-11 DIAGNOSIS — E7849 Other hyperlipidemia: Secondary | ICD-10-CM | POA: Diagnosis not present

## 2021-10-11 DIAGNOSIS — M1712 Unilateral primary osteoarthritis, left knee: Secondary | ICD-10-CM | POA: Diagnosis not present

## 2021-10-11 DIAGNOSIS — E1169 Type 2 diabetes mellitus with other specified complication: Secondary | ICD-10-CM | POA: Diagnosis not present

## 2021-10-11 DIAGNOSIS — B354 Tinea corporis: Secondary | ICD-10-CM | POA: Diagnosis not present

## 2021-10-11 DIAGNOSIS — I10 Essential (primary) hypertension: Secondary | ICD-10-CM | POA: Diagnosis not present

## 2021-10-11 DIAGNOSIS — Z6831 Body mass index (BMI) 31.0-31.9, adult: Secondary | ICD-10-CM | POA: Diagnosis not present

## 2021-12-27 NOTE — Telephone Encounter (Signed)
This encounter was created in error - please disregard.

## 2022-01-01 DIAGNOSIS — Z8546 Personal history of malignant neoplasm of prostate: Secondary | ICD-10-CM | POA: Diagnosis not present

## 2022-01-01 IMAGING — DX DG KNEE 1-2V PORT*R*
2 series · 2 of 2 positions shown · non-contrast
Comparison: None.

CLINICAL DATA: Status post right knee total arthroplasty

EXAM:
PORTABLE RIGHT KNEE - 1-2 VIEW

[knee ap]
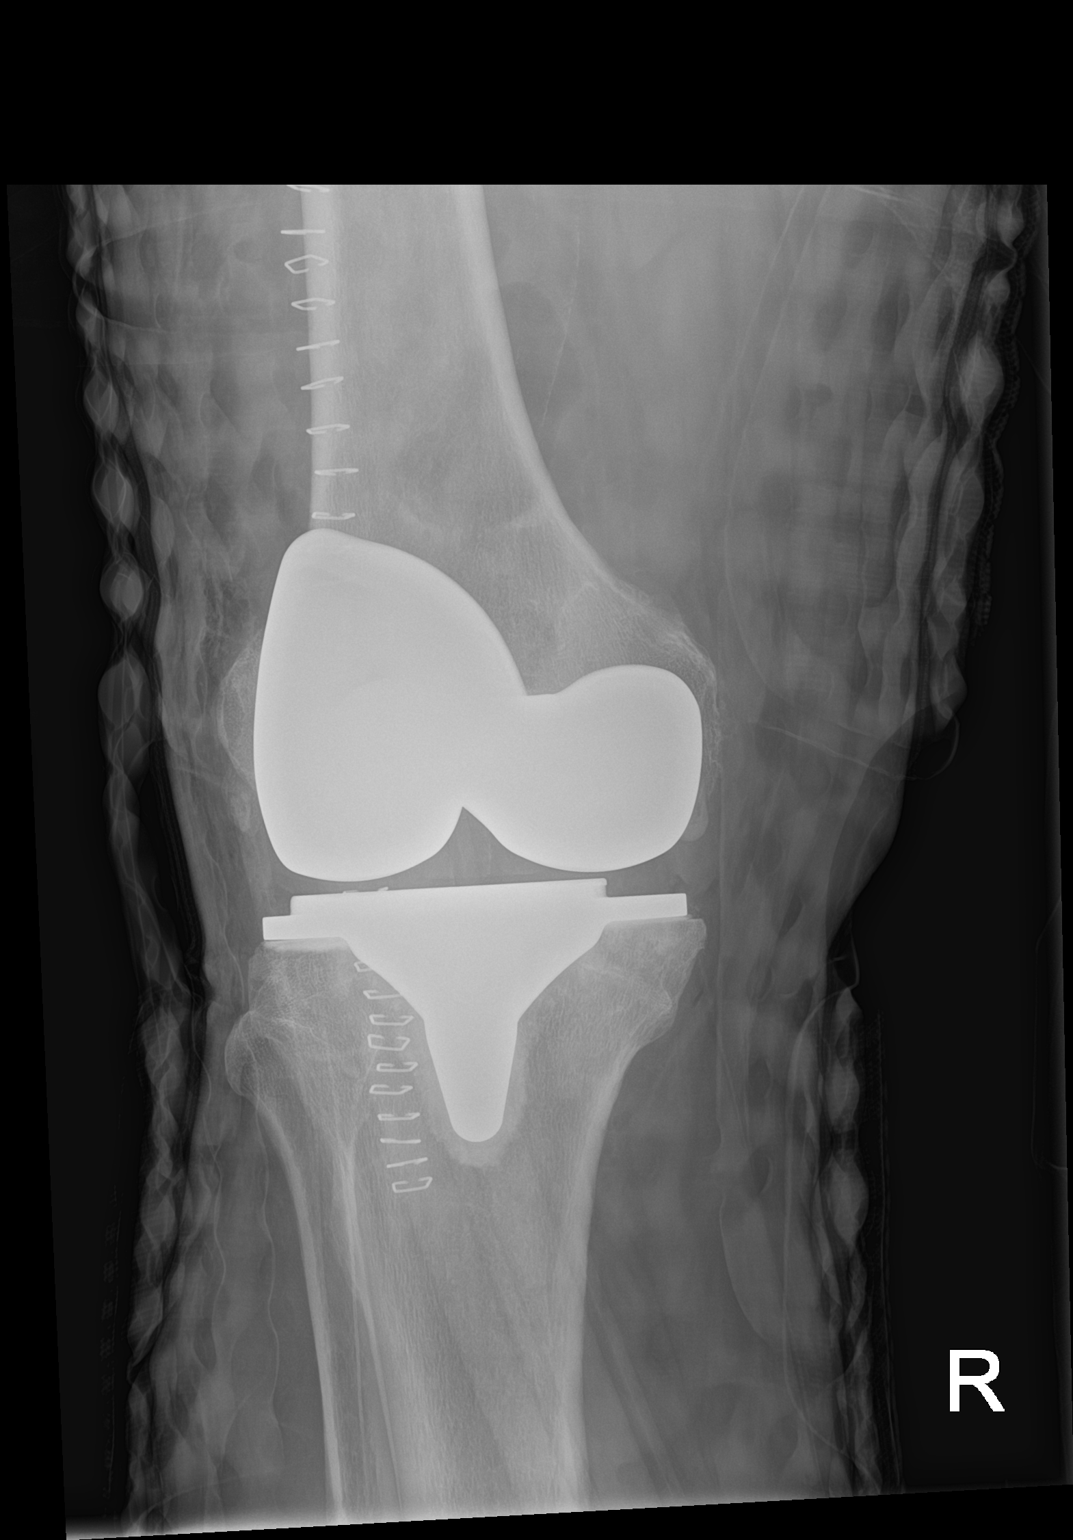

[knee lat]
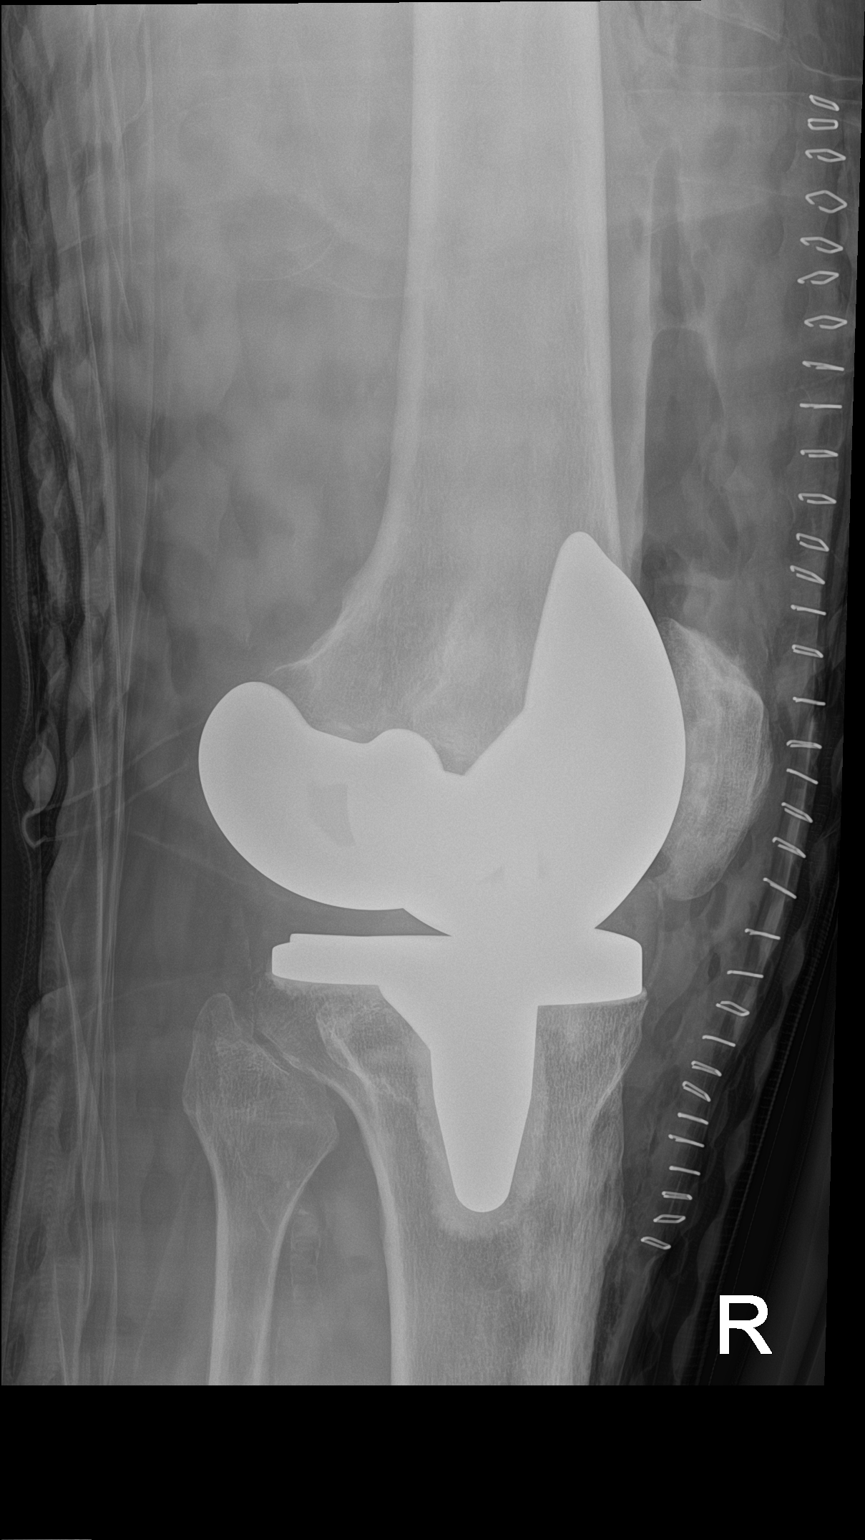

[2 of 2 positions shown; findings below may reference images not displayed]

FINDINGS: Status post right knee total arthroplasty with expected overlying
postoperative change. No evidence of perihardware fracture or
component malpositioning.
IMPRESSION: Status post right knee total arthroplasty with expected overlying
postoperative change. No evidence of perihardware fracture or
component malpositioning.

## 2022-01-08 DIAGNOSIS — N3946 Mixed incontinence: Secondary | ICD-10-CM | POA: Diagnosis not present

## 2022-01-08 DIAGNOSIS — N5231 Erectile dysfunction following radical prostatectomy: Secondary | ICD-10-CM | POA: Diagnosis not present

## 2022-01-08 DIAGNOSIS — Z8546 Personal history of malignant neoplasm of prostate: Secondary | ICD-10-CM | POA: Diagnosis not present

## 2022-02-07 DIAGNOSIS — Z23 Encounter for immunization: Secondary | ICD-10-CM | POA: Diagnosis not present

## 2022-02-12 DIAGNOSIS — H02831 Dermatochalasis of right upper eyelid: Secondary | ICD-10-CM | POA: Diagnosis not present

## 2022-02-12 DIAGNOSIS — H25813 Combined forms of age-related cataract, bilateral: Secondary | ICD-10-CM | POA: Diagnosis not present

## 2022-02-12 DIAGNOSIS — H02834 Dermatochalasis of left upper eyelid: Secondary | ICD-10-CM | POA: Diagnosis not present

## 2022-02-12 DIAGNOSIS — H18413 Arcus senilis, bilateral: Secondary | ICD-10-CM | POA: Diagnosis not present

## 2022-02-22 DIAGNOSIS — H25811 Combined forms of age-related cataract, right eye: Secondary | ICD-10-CM | POA: Diagnosis not present

## 2022-02-27 NOTE — H&P (Signed)
Surgical History & Physical  Patient Name: Robert Bishop DOB: Jul 08, 1945  Surgery: Cataract extraction with intraocular lens implant phacoemulsification; Right Eye  Surgeon: Baruch Goldmann MD Surgery Date:  03-05-22 Pre-Op Date:  02-12-22  HPI: A 19 Yr. old male patient 1. 1. The patient complains of difficulty when driving due to glare from headlights or sun, which began 3 years ago and has progressively worsen. Both eyes are affected. The episode is constant. This is negatively affecting the patient's quality of life and the patient is unable to function adequately in life with the current level of vision. HPI was performed by Baruch Goldmann .  Medical History: Cataracts OU: Flashes and Floaters, OU: Conjunctival Melanosis, OU: Cellophane Maculopathy, OU: Retinal Drusen Cancer Diabetes Elevated Cholesterol High Blood Pressure  Review of Systems Cardiovascular High Blood Pressure All recorded systems are negative except as noted above.  Social   Former smoker /  Cigarettes   Medication  Amlodipine, Atorvastatin, Metformin, Aspirin,   Sx/Procedures Knee Replacement, Prostate removed - cancer, Ulcer sx,   Drug Allergies   NKDA  History & Physical: Heent: cataract, right eye NECK: supple without bruits LUNGS: lungs clear to auscultation CV: regular rate and rhythm Abdomen: soft and non-tender Impression & Plan: Assessment: 1.  COMBINED FORMS AGE RELATED CATARACT; Both Eyes (H25.813) 2.  DERMATOCHALASIS, no surgery; Right Upper Lid, Left Upper Lid (H02.831, H02.834) 3.  ARCUS SENILIS; Both Eyes (H18.413) 4.  Pinguecula; Both Eyes (H11.153)  Plan: 1.  Cataract accounts for the patient's decreased vision. This visual impairment is not correctable with a tolerable change in glasses or contact lenses. Cataract surgery with an implantation of a new lens should significantly improve the visual and functional status of the patient. Discussed all risks, benefits, alternatives,  and potential complications. Discussed the procedures and recovery. Patient desires to have surgery. A-scan ordered and performed today for intra-ocular lens calculations. The surgery will be performed in order to improve vision for driving, reading, and for eye examinations. Recommend phacoemulsification with intra-ocular lens. Recommend Dextenza for post-operative pain and inflammation. Right Eye. Dilates well - shugarcaine by protocol.  2.  Asymptomatic, recommend observation for now. Findings, prognosis and treatment options reviewed.  3.  Discussed significance of finding  4.  Observe; Artificial tears as needed for irritation.

## 2022-02-28 ENCOUNTER — Encounter (HOSPITAL_COMMUNITY)
Admission: RE | Admit: 2022-02-28 | Discharge: 2022-02-28 | Disposition: A | Discharge status: Home / Self Care | Payer: Medicare Other | Source: Ambulatory Visit | Attending: Ophthalmology | Admitting: Ophthalmology

## 2022-02-28 ENCOUNTER — Encounter (HOSPITAL_COMMUNITY): Payer: Self-pay

## 2022-02-28 HISTORY — DX: Unspecified osteoarthritis, unspecified site: M19.90

## 2022-02-28 HISTORY — DX: Malignant (primary) neoplasm, unspecified: C80.1

## 2022-03-05 ENCOUNTER — Encounter (HOSPITAL_COMMUNITY)
Admission: RE | Disposition: A | Discharge status: Home / Self Care | Payer: Self-pay | Source: Ambulatory Visit | Attending: Ophthalmology

## 2022-03-05 ENCOUNTER — Ambulatory Visit (HOSPITAL_BASED_OUTPATIENT_CLINIC_OR_DEPARTMENT_OTHER): Payer: Medicare Other | Admitting: Certified Registered Nurse Anesthetist

## 2022-03-05 ENCOUNTER — Ambulatory Visit (HOSPITAL_COMMUNITY): Payer: Medicare Other | Admitting: Certified Registered Nurse Anesthetist

## 2022-03-05 ENCOUNTER — Ambulatory Visit (HOSPITAL_COMMUNITY)
Admission: RE | Admit: 2022-03-05 | Discharge: 2022-03-05 | Disposition: A | Discharge status: Home / Self Care | Payer: Medicare Other | Source: Ambulatory Visit | Attending: Ophthalmology | Admitting: Ophthalmology

## 2022-03-05 ENCOUNTER — Encounter (HOSPITAL_COMMUNITY): Payer: Self-pay | Admitting: Ophthalmology

## 2022-03-05 DIAGNOSIS — Z87891 Personal history of nicotine dependence: Secondary | ICD-10-CM | POA: Insufficient documentation

## 2022-03-05 DIAGNOSIS — H25811 Combined forms of age-related cataract, right eye: Secondary | ICD-10-CM | POA: Diagnosis not present

## 2022-03-05 DIAGNOSIS — I10 Essential (primary) hypertension: Secondary | ICD-10-CM | POA: Insufficient documentation

## 2022-03-05 DIAGNOSIS — E1136 Type 2 diabetes mellitus with diabetic cataract: Secondary | ICD-10-CM | POA: Diagnosis not present

## 2022-03-05 DIAGNOSIS — K219 Gastro-esophageal reflux disease without esophagitis: Secondary | ICD-10-CM | POA: Diagnosis not present

## 2022-03-05 DIAGNOSIS — E119 Type 2 diabetes mellitus without complications: Secondary | ICD-10-CM | POA: Diagnosis not present

## 2022-03-05 DIAGNOSIS — Z7984 Long term (current) use of oral hypoglycemic drugs: Secondary | ICD-10-CM

## 2022-03-05 DIAGNOSIS — E7800 Pure hypercholesterolemia, unspecified: Secondary | ICD-10-CM | POA: Diagnosis not present

## 2022-03-05 HISTORY — PX: CATARACT EXTRACTION W/PHACO: SHX586

## 2022-03-05 LAB — GLUCOSE, CAPILLARY: Glucose-Capillary: 84 mg/dL (ref 70–99)

## 2022-03-05 SURGERY — PHACOEMULSIFICATION, CATARACT, WITH IOL INSERTION
Anesthesia: Monitor Anesthesia Care | Site: Eye | Laterality: Right

## 2022-03-05 MED ORDER — PHENYLEPHRINE-KETOROLAC 1-0.3 % IO SOLN
INTRAOCULAR | Status: AC
  Filled 2022-03-05: qty 4

## 2022-03-05 MED ORDER — MIDAZOLAM HCL 2 MG/2ML IJ SOLN
INTRAMUSCULAR | Status: AC
  Filled 2022-03-05: qty 2

## 2022-03-05 MED ORDER — SODIUM HYALURONATE 10 MG/ML IO SOLUTION
PREFILLED_SYRINGE | INTRAOCULAR | Status: DC | PRN
  Administered 2022-03-05: .85 mL via INTRAOCULAR

## 2022-03-05 MED ORDER — MOXIFLOXACIN HCL 5 MG/ML IO SOLN
INTRAOCULAR | Status: AC
  Filled 2022-03-05: qty 1

## 2022-03-05 MED ORDER — TETRACAINE HCL 0.5 % OP SOLN
1.0000 [drp] | OPHTHALMIC | Status: AC | PRN
  Administered 2022-03-05 (×3): 1 [drp] via OPHTHALMIC

## 2022-03-05 MED ORDER — PHENYLEPHRINE HCL 2.5 % OP SOLN
1.0000 [drp] | OPHTHALMIC | Status: AC | PRN
  Administered 2022-03-05 (×3): 1 [drp] via OPHTHALMIC

## 2022-03-05 MED ORDER — LIDOCAINE HCL 3.5 % OP GEL
1.0000 | Freq: Once | OPHTHALMIC | Status: AC
  Administered 2022-03-05: 1 via OPHTHALMIC

## 2022-03-05 MED ORDER — TROPICAMIDE 1 % OP SOLN
1.0000 [drp] | OPHTHALMIC | Status: AC | PRN
  Administered 2022-03-05 (×3): 1 [drp] via OPHTHALMIC

## 2022-03-05 MED ORDER — EPINEPHRINE PF 1 MG/ML IJ SOLN
INTRAOCULAR | Status: DC | PRN
  Administered 2022-03-05: 500 mL

## 2022-03-05 MED ORDER — STERILE WATER FOR IRRIGATION IR SOLN
Status: DC | PRN
  Administered 2022-03-05: 250 mL

## 2022-03-05 MED ORDER — POVIDONE-IODINE 5 % OP SOLN
OPHTHALMIC | Status: DC | PRN
  Administered 2022-03-05: 1 via OPHTHALMIC

## 2022-03-05 MED ORDER — SODIUM HYALURONATE 23MG/ML IO SOSY
PREFILLED_SYRINGE | INTRAOCULAR | Status: DC | PRN
  Administered 2022-03-05: .6 mL via INTRAOCULAR

## 2022-03-05 MED ORDER — LIDOCAINE HCL (PF) 1 % IJ SOLN
INTRAOCULAR | Status: DC | PRN
  Administered 2022-03-05: 1 mL via OPHTHALMIC

## 2022-03-05 MED ORDER — MOXIFLOXACIN HCL 0.5 % OP SOLN
OPHTHALMIC | Status: DC | PRN
  Administered 2022-03-05: .2 mL via OPHTHALMIC

## 2022-03-05 MED ORDER — BSS IO SOLN
INTRAOCULAR | Status: DC | PRN
  Administered 2022-03-05: 15 mL via INTRAOCULAR

## 2022-03-05 SURGICAL SUPPLY — 15 items
CATARACT SUITE SIGHTPATH (MISCELLANEOUS) ×1 IMPLANT
CLOTH BEACON ORANGE TIMEOUT ST (SAFETY) ×1 IMPLANT
EYE SHIELD UNIVERSAL CLEAR (GAUZE/BANDAGES/DRESSINGS) IMPLANT
FEE CATARACT SUITE SIGHTPATH (MISCELLANEOUS) ×1 IMPLANT
GLOVE BIOGEL PI IND STRL 7.0 (GLOVE) ×2 IMPLANT
LENS IOL RAYNER 18.0 (Intraocular Lens) ×1 IMPLANT
LENS IOL RAYONE EMV 18.0 (Intraocular Lens) IMPLANT
NDL HYPO 18GX1.5 BLUNT FILL (NEEDLE) ×1 IMPLANT
NEEDLE HYPO 18GX1.5 BLUNT FILL (NEEDLE) ×1 IMPLANT
PAD ARMBOARD 7.5X6 YLW CONV (MISCELLANEOUS) ×1 IMPLANT
RING MALYGIN 7.0 (MISCELLANEOUS) IMPLANT
SYR TB 1ML LL NO SAFETY (SYRINGE) ×1 IMPLANT
TAPE SURG TRANSPORE 1 IN (GAUZE/BANDAGES/DRESSINGS) IMPLANT
TAPE SURGICAL TRANSPORE 1 IN (GAUZE/BANDAGES/DRESSINGS) ×1
WATER STERILE IRR 250ML POUR (IV SOLUTION) ×1 IMPLANT

## 2022-03-05 NOTE — Interval H&P Note (Signed)
History and Physical Interval Note:  03/05/2022 9:12 AM  Robert Bishop  has presented today for surgery, with the diagnosis of combined forms age related cataract; right.  The various methods of treatment have been discussed with the patient and family. After consideration of risks, benefits and other options for treatment, the patient has consented to  Procedure(s): CATARACT EXTRACTION PHACO AND INTRAOCULAR LENS PLACEMENT ([place]) (Right) as a surgical intervention.  The patient's history has been reviewed, patient examined, no change in status, stable for surgery.  I have reviewed the patient's chart and labs.  Questions were answered to the patient's satisfaction.     Baruch Goldmann

## 2022-03-05 NOTE — Anesthesia Preprocedure Evaluation (Signed)
Anesthesia Evaluation  Patient identified by MRN, date of birth, ID band Patient awake    Reviewed: Allergy & Precautions, H&P , NPO status , Patient's Chart, lab work & pertinent test results, reviewed documented beta blocker date and time   Airway Mallampati: II  TM Distance: >3 FB Neck ROM: full    Dental no notable dental hx.    Pulmonary neg pulmonary ROS, former smoker   Pulmonary exam normal breath sounds clear to auscultation       Cardiovascular Exercise Tolerance: Good hypertension, negative cardio ROS  Rhythm:regular Rate:Normal     Neuro/Psych negative neurological ROS  negative psych ROS   GI/Hepatic negative GI ROS, Neg liver ROS, hiatal hernia,GERD  ,,  Endo/Other  negative endocrine ROSdiabetes, Type 2    Renal/GU negative Renal ROS  negative genitourinary   Musculoskeletal   Abdominal   Peds  Hematology negative hematology ROS (+) Blood dyscrasia, anemia   Anesthesia Other Findings   Reproductive/Obstetrics negative OB ROS                             Anesthesia Physical Anesthesia Plan  ASA: 2  Anesthesia Plan: MAC   Post-op Pain Management:    Induction:   PONV Risk Score and Plan:   Airway Management Planned:   Additional Equipment:   Intra-op Plan:   Post-operative Plan:   Informed Consent: I have reviewed the patients History and Physical, chart, labs and discussed the procedure including the risks, benefits and alternatives for the proposed anesthesia with the patient or authorized representative who has indicated his/her understanding and acceptance.     Dental Advisory Given  Plan Discussed with: CRNA  Anesthesia Plan Comments:        Anesthesia Quick Evaluation

## 2022-03-05 NOTE — Op Note (Signed)
Date of procedure: 03/05/22  Pre-operative diagnosis:  Visually significant combined form age-related cataract, Right Eye (H25.811)  Post-operative diagnosis:  Visually significant combined form age-related cataract, Right Eye (H25.811)  Procedure: Removal of cataract via phacoemulsification and insertion of intra-ocular lens Rayner [id number] +18.0D into the capsular bag of the Right Eye  Attending surgeon: Gerda Diss. Socrates Cahoon, MD, MA  Anesthesia: MAC, Topical Akten  Complications: None  Estimated Blood Loss: <78m (minimal)  Specimens: None  Implants: As above  Indications:  Visually significant age-related cataract, Right Eye  Procedure:  The patient was seen and identified in the pre-operative area. The operative eye was identified and dilated.  The operative eye was marked.  Topical anesthesia was administered to the operative eye.     The patient was then to the operative suite and placed in the supine position.  A timeout was performed confirming the patient, procedure to be performed, and all other relevant information.   The patient's face was prepped and draped in the usual fashion for intra-ocular surgery.  A lid speculum was placed into the operative eye and the surgical microscope moved into place and focused.  A superotemporal paracentesis was created using a 20 gauge paracentesis blade.  Shugarcaine was injected into the anterior chamber.  Viscoelastic was injected into the anterior chamber.  A temporal clear-corneal main wound incision was created using a 2.469mmicrokeratome.  A continuous curvilinear capsulorrhexis was initiated using an irrigating cystitome and completed using capsulorrhexis forceps.  Hydrodissection and hydrodeliniation were performed.  Viscoelastic was injected into the anterior chamber.  A phacoemulsification handpiece and a chopper as a second instrument were used to remove the nucleus and epinucleus. The irrigation/aspiration handpiece was used to remove any  remaining cortical material.   The capsular bag was reinflated with viscoelastic, checked, and found to be intact.  The intraocular lens was inserted into the capsular bag.  The irrigation/aspiration handpiece was used to remove any remaining viscoelastic.  The clear corneal wound and paracentesis wounds were then hydrated and checked with Weck-Cels to be watertight. 0.3m35mf moxifloxacin was injected into the anterior chamber. The lid-speculum was removed.  The drape was removed.  The patient's face was cleaned with a wet and dry 4x4. A clear shield was taped over the eye. The patient was taken to the post-operative care unit in good condition, having tolerated the procedure well.  Post-Op Instructions: [facility] and will receive all other orders and instructions.

## 2022-03-05 NOTE — Discharge Instructions (Signed)
Please discharge patient when stable, will follow up today with Dr. Jasslyn Finkel at the [facility] office immediately following discharge.  Leave shield in place until visit.  All paperwork with discharge instructions will be given at the office.  [facility] Address:  [address]  [place], [place] [address]  

## 2022-03-05 NOTE — Transfer of Care (Signed)
Immediate Anesthesia Transfer of Care Note  Patient: Robert Bishop  Procedure(s) Performed: CATARACT EXTRACTION PHACO AND INTRAOCULAR LENS PLACEMENT ([facility]) (Right: Eye)  Patient Location: PACU  Anesthesia Type:MAC  Level of Consciousness: awake, alert , and oriented  Airway & Oxygen Therapy: Patient Spontanous Breathing  Post-op Assessment: Report given to RN and Post -op Vital signs reviewed and stable  Post vital signs: Reviewed and stable  Last Vitals:  Vitals Value Taken Time  BP    Temp    Pulse    Resp    SpO2      Last Pain:  Vitals:   03/05/22 0847  TempSrc: Oral  PainSc: 0-No pain         Complications: No notable events documented.

## 2022-03-06 NOTE — Anesthesia Postprocedure Evaluation (Signed)
Anesthesia Post Note  Patient: Robert Bishop  Procedure(s) Performed: CATARACT EXTRACTION PHACO AND INTRAOCULAR LENS PLACEMENT ([facility]) (Right: Eye)  Patient location during evaluation: Phase II Anesthesia Type: MAC Level of consciousness: awake Pain management: pain level controlled Vital Signs Assessment: post-procedure vital signs reviewed and stable Respiratory status: spontaneous breathing and respiratory function stable Cardiovascular status: blood pressure returned to baseline and stable Postop Assessment: no headache and no apparent nausea or vomiting Anesthetic complications: no Comments: Late entry   No notable events documented.   Last Vitals:  Vitals:   03/05/22 0937 03/05/22 0938  BP:  (!) 150/85  Pulse: (!) 58   Resp: 18   Temp: 36.7 C   SpO2: 98%     Last Pain:  Vitals:   03/05/22 0937  TempSrc: Oral  PainSc: 0-No pain                 Louann Sjogren

## 2022-03-07 ENCOUNTER — Encounter (HOSPITAL_COMMUNITY): Payer: Self-pay | Admitting: Ophthalmology

## 2022-03-12 DIAGNOSIS — H25812 Combined forms of age-related cataract, left eye: Secondary | ICD-10-CM | POA: Diagnosis not present

## 2022-03-14 ENCOUNTER — Encounter (HOSPITAL_COMMUNITY)
Admission: RE | Admit: 2022-03-14 | Discharge: 2022-03-14 | Disposition: A | Discharge status: Home / Self Care | Payer: Medicare Other | Source: Ambulatory Visit | Attending: Ophthalmology | Admitting: Ophthalmology

## 2022-03-15 NOTE — H&P (Signed)
Surgical History & Physical  Patient Name: Robert Bishop DOB: 1945/12/11  Surgery: Cataract extraction with intraocular lens implant phacoemulsification; Left Eye  Surgeon: Baruch Goldmann MD Surgery Date:  03-19-22 Pre-Op Date:  03-08-22  HPI: A 57 Yr. old male patient 1. The patient is returning after cataract surgery. The right eye is affected. Status post cataract surgery, which began 3 day ago: Since the last visit, the affected area is doing well. Patient is following medication instructions: Combo TID OD Also here for evaluation of the left eye. Patient states that vision is blurry in the left eye and he is having glare with bright lights. Small print is hard to read in low light conditions especially. The eyes are imbalanced. This is negatively affecting the patient's quality of life and the patient is unable to function adequately in life with the current level of vision.  Medical History: Cataracts OU: Flashes and Floaters, OU: Conjunctival Melanosis, OU: Cellophane Maculopathy, OU: Retinal Drusen Cancer Diabetes Elevated Cholesterol High Blood Pressure  Review of Systems Cardiovascular High Blood Pressure All recorded systems are negative except as noted above.  Social   Former smoker /  Cigarettes   Medication Prednisolone-Moxifloxacin-Bromfenac,  Amlodipine, Atorvastatin, Metformin, Aspirin,   Sx/Procedures Phaco c IOL OD,  Knee Replacement, Prostate removed - cancer, Ulcer sx,   Drug Allergies   NKDA  History & Physical: Heent: cataract, left eye NECK: supple without bruits LUNGS: lungs clear to auscultation CV: regular rate and rhythm Abdomen: soft and non-tender Impression & Plan: Assessment: 1.  CATARACT EXTRACTION STATUS; Right Eye (Z98.41) 2.  INTRAOCULAR LENS IOL ; Right Eye (Z96.1) 3.  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  3 days after cataract surgery. Doing well with improved vision and normal eye pressure. Call with  any problems or concerns. Continue Pred-Moxi-Brom 3x/day for 4 more days, then 2x/day for 3 more weeks.  2.  Doing well since surgery Continue Post-op medications  3.  Cataract accounts for the patient's decreased vision. This visual impairment is not correctable with a tolerable change in glasses or contact lenses. Cataract surgery with an implantation of a new lens should significantly improve the visual and functional status of the patient. Discussed all risks, benefits, alternatives, and potential complications. Discussed the procedures and recovery. Patient desires to have surgery. A-scan ordered and performed today for intra-ocular lens calculations. The surgery will be performed in order to improve vision for driving, reading, and for eye examinations. Recommend phacoemulsification with intra-ocular lens. Recommend Dextenza for post-operative pain and inflammation. Left Eye. Surgery required to correct imbalance of vision. Dilates well - shugarcaine by protocol.

## 2022-03-19 ENCOUNTER — Ambulatory Visit (HOSPITAL_BASED_OUTPATIENT_CLINIC_OR_DEPARTMENT_OTHER): Payer: Medicare Other | Admitting: Anesthesiology

## 2022-03-19 ENCOUNTER — Encounter (HOSPITAL_COMMUNITY): Payer: Self-pay | Admitting: Ophthalmology

## 2022-03-19 ENCOUNTER — Ambulatory Visit (HOSPITAL_COMMUNITY)
Admission: RE | Admit: 2022-03-19 | Discharge: 2022-03-19 | Disposition: A | Discharge status: Home / Self Care | Payer: Medicare Other | Source: Ambulatory Visit | Attending: Ophthalmology | Admitting: Ophthalmology

## 2022-03-19 ENCOUNTER — Ambulatory Visit (HOSPITAL_COMMUNITY): Payer: Medicare Other | Admitting: Anesthesiology

## 2022-03-19 ENCOUNTER — Encounter (HOSPITAL_COMMUNITY)
Admission: RE | Disposition: A | Discharge status: Home / Self Care | Payer: Self-pay | Source: Ambulatory Visit | Attending: Ophthalmology

## 2022-03-19 DIAGNOSIS — Z9841 Cataract extraction status, right eye: Secondary | ICD-10-CM | POA: Insufficient documentation

## 2022-03-19 DIAGNOSIS — Z87891 Personal history of nicotine dependence: Secondary | ICD-10-CM | POA: Insufficient documentation

## 2022-03-19 DIAGNOSIS — I10 Essential (primary) hypertension: Secondary | ICD-10-CM

## 2022-03-19 DIAGNOSIS — H25812 Combined forms of age-related cataract, left eye: Secondary | ICD-10-CM | POA: Insufficient documentation

## 2022-03-19 DIAGNOSIS — Z7984 Long term (current) use of oral hypoglycemic drugs: Secondary | ICD-10-CM | POA: Diagnosis not present

## 2022-03-19 DIAGNOSIS — E1136 Type 2 diabetes mellitus with diabetic cataract: Secondary | ICD-10-CM | POA: Insufficient documentation

## 2022-03-19 DIAGNOSIS — Z961 Presence of intraocular lens: Secondary | ICD-10-CM | POA: Insufficient documentation

## 2022-03-19 DIAGNOSIS — H2512 Age-related nuclear cataract, left eye: Secondary | ICD-10-CM

## 2022-03-19 DIAGNOSIS — D649 Anemia, unspecified: Secondary | ICD-10-CM

## 2022-03-19 DIAGNOSIS — K219 Gastro-esophageal reflux disease without esophagitis: Secondary | ICD-10-CM | POA: Insufficient documentation

## 2022-03-19 DIAGNOSIS — Z8719 Personal history of other diseases of the digestive system: Secondary | ICD-10-CM | POA: Insufficient documentation

## 2022-03-19 HISTORY — PX: CATARACT EXTRACTION W/PHACO: SHX586

## 2022-03-19 LAB — GLUCOSE, CAPILLARY: Glucose-Capillary: 91 mg/dL (ref 70–99)

## 2022-03-19 SURGERY — PHACOEMULSIFICATION, CATARACT, WITH IOL INSERTION
Anesthesia: Monitor Anesthesia Care | Site: Eye | Laterality: Left

## 2022-03-19 MED ORDER — SODIUM HYALURONATE 23MG/ML IO SOSY
PREFILLED_SYRINGE | INTRAOCULAR | Status: DC | PRN
  Administered 2022-03-19: .6 mL via INTRAOCULAR

## 2022-03-19 MED ORDER — TETRACAINE HCL 0.5 % OP SOLN
1.0000 [drp] | OPHTHALMIC | Status: AC | PRN
  Administered 2022-03-19 (×3): 1 [drp] via OPHTHALMIC

## 2022-03-19 MED ORDER — LIDOCAINE HCL (PF) 1 % IJ SOLN
INTRAOCULAR | Status: DC | PRN
  Administered 2022-03-19: 1 mL via OPHTHALMIC

## 2022-03-19 MED ORDER — LIDOCAINE HCL 3.5 % OP GEL
1.0000 | Freq: Once | OPHTHALMIC | Status: AC
  Administered 2022-03-19: 1 via OPHTHALMIC

## 2022-03-19 MED ORDER — LACTATED RINGERS IV SOLN: INTRAVENOUS | Status: DC

## 2022-03-19 MED ORDER — MOXIFLOXACIN HCL 0.5 % OP SOLN
OPHTHALMIC | Status: DC | PRN
  Administered 2022-03-19: .3 mL via OPHTHALMIC

## 2022-03-19 MED ORDER — POVIDONE-IODINE 5 % OP SOLN
OPHTHALMIC | Status: DC | PRN
  Administered 2022-03-19: 1 via OPHTHALMIC

## 2022-03-19 MED ORDER — MOXIFLOXACIN HCL 5 MG/ML IO SOLN
INTRAOCULAR | Status: AC
  Filled 2022-03-19: qty 1

## 2022-03-19 MED ORDER — TROPICAMIDE 1 % OP SOLN
1.0000 [drp] | OPHTHALMIC | Status: AC | PRN
  Administered 2022-03-19 (×3): 1 [drp] via OPHTHALMIC

## 2022-03-19 MED ORDER — STERILE WATER FOR IRRIGATION IR SOLN
Status: DC | PRN
  Administered 2022-03-19: 25 mL

## 2022-03-19 MED ORDER — PHENYLEPHRINE HCL 2.5 % OP SOLN
1.0000 [drp] | OPHTHALMIC | Status: AC | PRN
  Administered 2022-03-19 (×3): 1 [drp] via OPHTHALMIC

## 2022-03-19 MED ORDER — EPINEPHRINE PF 1 MG/ML IJ SOLN
INTRAMUSCULAR | Status: AC
  Filled 2022-03-19: qty 2

## 2022-03-19 MED ORDER — EPINEPHRINE PF 1 MG/ML IJ SOLN
INTRAOCULAR | Status: DC | PRN
  Administered 2022-03-19: 500 mL

## 2022-03-19 MED ORDER — BSS IO SOLN
INTRAOCULAR | Status: DC | PRN
  Administered 2022-03-19: 15 mL via INTRAOCULAR

## 2022-03-19 MED ORDER — SODIUM HYALURONATE 10 MG/ML IO SOLUTION
PREFILLED_SYRINGE | INTRAOCULAR | Status: DC | PRN
  Administered 2022-03-19: .85 mL via INTRAOCULAR

## 2022-03-19 MED ORDER — PHENYLEPHRINE-KETOROLAC 1-0.3 % IO SOLN
INTRAOCULAR | Status: AC
  Filled 2022-03-19: qty 4

## 2022-03-19 SURGICAL SUPPLY — 15 items
CATARACT SUITE SIGHTPATH (MISCELLANEOUS) ×1 IMPLANT
CLOTH BEACON ORANGE TIMEOUT ST (SAFETY) ×1 IMPLANT
EYE SHIELD UNIVERSAL CLEAR (GAUZE/BANDAGES/DRESSINGS) IMPLANT
FEE CATARACT SUITE SIGHTPATH (MISCELLANEOUS) ×1 IMPLANT
GLOVE BIOGEL PI IND STRL 6.5 (GLOVE) IMPLANT
GLOVE BIOGEL PI IND STRL 7.0 (GLOVE) ×2 IMPLANT
LENS IOL RAYNER 18.0 (Intraocular Lens) ×1 IMPLANT
LENS IOL RAYONE EMV 18.0 (Intraocular Lens) IMPLANT
NDL HYPO 18GX1.5 BLUNT FILL (NEEDLE) ×1 IMPLANT
NEEDLE HYPO 18GX1.5 BLUNT FILL (NEEDLE) ×1 IMPLANT
PAD ARMBOARD 7.5X6 YLW CONV (MISCELLANEOUS) ×1 IMPLANT
SYR TB 1ML LL NO SAFETY (SYRINGE) ×1 IMPLANT
TAPE SURG TRANSPORE 1 IN (GAUZE/BANDAGES/DRESSINGS) IMPLANT
TAPE SURGICAL TRANSPORE 1 IN (GAUZE/BANDAGES/DRESSINGS) ×1
WATER STERILE IRR 250ML POUR (IV SOLUTION) ×1 IMPLANT

## 2022-03-19 NOTE — Anesthesia Preprocedure Evaluation (Signed)
Anesthesia Evaluation  Patient identified by MRN, date of birth, ID band Patient awake    Reviewed: Allergy & Precautions, H&P , NPO status , Patient's Chart, lab work & pertinent test results  Airway Mallampati: III  TM Distance: >3 FB Neck ROM: Full    Dental  (+) Dental Advisory Given, Missing, Loose,    Pulmonary former smoker   Pulmonary exam normal breath sounds clear to auscultation       Cardiovascular Exercise Tolerance: Good METS: 3 - Mets hypertension, Pt. on medications Normal cardiovascular exam Rhythm:Regular Rate:Normal  EKG - Sinus brady with first degree block, pulmonary disease pattern, left axis deviation   Neuro/Psych  Neuromuscular disease (Sciatica)  negative psych ROS   GI/Hepatic Neg liver ROS, hiatal hernia,GERD  Controlled,,  Endo/Other  diabetes, Well Controlled, Type 2, Oral Hypoglycemic Agents    Renal/GU negative Renal ROS  negative genitourinary   Musculoskeletal  (+) Arthritis , Osteoarthritis,  LEFT SIDED SCIATICA   Abdominal   Peds negative pediatric ROS (+)  Hematology  (+) Blood dyscrasia, anemia   Anesthesia Other Findings   Reproductive/Obstetrics negative OB ROS                             Anesthesia Physical Anesthesia Plan  ASA: 2  Anesthesia Plan: MAC   Post-op Pain Management: Minimal or no pain anticipated   Induction:   PONV Risk Score and Plan: 0  Airway Management Planned: Natural Airway and Nasal Cannula  Additional Equipment:   Intra-op Plan:   Post-operative Plan:   Informed Consent: I have reviewed the patients History and Physical, chart, labs and discussed the procedure including the risks, benefits and alternatives for the proposed anesthesia with the patient or authorized representative who has indicated his/her understanding and acceptance.     Dental advisory given  Plan Discussed with: CRNA and  Surgeon  Anesthesia Plan Comments:         Anesthesia Quick Evaluation

## 2022-03-19 NOTE — Anesthesia Postprocedure Evaluation (Signed)
Anesthesia Post Note  Patient: Robert Bishop  Procedure(s) Performed: CATARACT EXTRACTION PHACO AND INTRAOCULAR LENS PLACEMENT ([facility]) (Left: Eye)  Patient location during evaluation: Phase II Anesthesia Type: MAC Level of consciousness: awake and alert and oriented Pain management: pain level controlled Vital Signs Assessment: post-procedure vital signs reviewed and stable Respiratory status: spontaneous breathing, nonlabored ventilation and respiratory function stable Cardiovascular status: blood pressure returned to baseline and stable Postop Assessment: no apparent nausea or vomiting Anesthetic complications: no  No notable events documented.   Last Vitals:  Vitals:   03/19/22 0813 03/19/22 0906  BP: 134/77 (!) 140/83  Pulse: (!) 53 (!) 58  Resp: 16 17  Temp: 36.7 C 37.2 C  SpO2: 100% 94%    Last Pain:  Vitals:   03/19/22 0906  TempSrc: Oral  PainSc: 0-No pain                 Rosmery Duggin C Rhylie Stehr

## 2022-03-19 NOTE — Transfer of Care (Signed)
Immediate Anesthesia Transfer of Care Note  Patient: Robert Bishop  Procedure(s) Performed: CATARACT EXTRACTION PHACO AND INTRAOCULAR LENS PLACEMENT ([facility]) (Left: Eye) MAC Patient Location: Short Stay  Anesthesia Type:MAC  Level of Consciousness: awake, alert , oriented, and patient cooperative  Airway & Oxygen Therapy: Patient Spontanous Breathing  Post-op Assessment: Report given to RN, Post -op Vital signs reviewed and stable, and Patient moving all extremities  Post vital signs: Reviewed and stable  Last Vitals:  Vitals Value Taken Time  BP 140/83 03/19/22 0906  Temp 37.2 C 03/19/22 0906  Pulse 58 03/19/22 0906  Resp 17 03/19/22 0906  SpO2 94 % 03/19/22 0906    Last Pain:  Vitals:   03/19/22 0906  TempSrc: Oral  PainSc: 0-No pain         Complications: No notable events documented.

## 2022-03-19 NOTE — Discharge Instructions (Signed)
Please discharge patient when stable, will follow up today with Dr. Ciclaly Mulcahey at the [facility] office immediately following discharge.  Leave shield in place until visit.  All paperwork with discharge instructions will be given at the office.  [facility] Address:  [address]  [place], [place] [address]  

## 2022-03-19 NOTE — Interval H&P Note (Signed)
History and Physical Interval Note:  03/19/2022 8:43 AM  Robert Bishop  has presented today for surgery, with the diagnosis of nuclear sclerosis age related cataract; left.  The various methods of treatment have been discussed with the patient and family. After consideration of risks, benefits and other options for treatment, the patient has consented to  Procedure(s) with comments: CATARACT EXTRACTION PHACO AND INTRAOCULAR LENS PLACEMENT ([facility]) (Left) - CDE: as a surgical intervention.  The patient's history has been reviewed, patient examined, no change in status, stable for surgery.  I have reviewed the patient's chart and labs.  Questions were answered to the patient's satisfaction.     Baruch Goldmann

## 2022-03-19 NOTE — Op Note (Signed)
Date of procedure: 03/19/22  Pre-operative diagnosis: Visually significant age-related combined cataract, Left Eye (H25.812)  Post-operative diagnosis: Visually significant age-related combined cataract, Left Eye (H25.812)  Procedure: Removal of cataract via phacoemulsification and insertion of intra-ocular lens Rayner [id number] +18.0D into the capsular bag of the Left Eye  Attending surgeon: Gerda Diss. Kennidi Yoshida, MD, MA  Anesthesia: MAC, Topical Akten  Complications: None  Estimated Blood Loss: <32m (minimal)  Specimens: None  Implants: As above  Indications:  Visually significant age-related cataract, Left Eye  Procedure:  The patient was seen and identified in the pre-operative area. The operative eye was identified and dilated.  The operative eye was marked.  Topical anesthesia was administered to the operative eye.     The patient was then to the operative suite and placed in the supine position.  A timeout was performed confirming the patient, procedure to be performed, and all other relevant information.   The patient's face was prepped and draped in the usual fashion for intra-ocular surgery.  A lid speculum was placed into the operative eye and the surgical microscope moved into place and focused.  An inferotemporal paracentesis was created using a 20 gauge paracentesis blade.  Shugarcaine was injected into the anterior chamber.  Viscoelastic was injected into the anterior chamber.  A temporal clear-corneal main wound incision was created using a 2.469mmicrokeratome.  A continuous curvilinear capsulorrhexis was initiated using an irrigating cystitome and completed using capsulorrhexis forceps.  Hydrodissection and hydrodeliniation were performed.  Viscoelastic was injected into the anterior chamber.  A phacoemulsification handpiece and a chopper as a second instrument were used to remove the nucleus and epinucleus. The irrigation/aspiration handpiece was used to remove any remaining  cortical material.   The capsular bag was reinflated with viscoelastic, checked, and found to be intact.  The intraocular lens was inserted into the capsular bag.  The irrigation/aspiration handpiece was used to remove any remaining viscoelastic.  The clear corneal wound and paracentesis wounds were then hydrated and checked with Weck-Cels to be watertight. 0.42m54mf moxifloxacin was injected into the anterior chamber. The lid-speculum was removed.  The drape was removed.  The patient's face was cleaned with a wet and dry 4x4.    A clear shield was taped over the eye. The patient was taken to the post-operative care unit in good condition, having tolerated the procedure well.  Post-Op Instructions: [facility] and will receive all other orders and instructions.

## 2022-03-23 ENCOUNTER — Encounter (HOSPITAL_COMMUNITY): Payer: Self-pay | Admitting: Ophthalmology

## 2022-04-06 DIAGNOSIS — E782 Mixed hyperlipidemia: Secondary | ICD-10-CM | POA: Diagnosis not present

## 2022-04-06 DIAGNOSIS — I10 Essential (primary) hypertension: Secondary | ICD-10-CM | POA: Diagnosis not present

## 2022-04-06 DIAGNOSIS — Z1329 Encounter for screening for other suspected endocrine disorder: Secondary | ICD-10-CM | POA: Diagnosis not present

## 2022-04-06 DIAGNOSIS — E7849 Other hyperlipidemia: Secondary | ICD-10-CM | POA: Diagnosis not present

## 2022-04-06 DIAGNOSIS — E7801 Familial hypercholesterolemia: Secondary | ICD-10-CM | POA: Diagnosis not present

## 2022-04-06 DIAGNOSIS — E039 Hypothyroidism, unspecified: Secondary | ICD-10-CM | POA: Diagnosis not present

## 2022-04-06 DIAGNOSIS — E119 Type 2 diabetes mellitus without complications: Secondary | ICD-10-CM | POA: Diagnosis not present

## 2022-04-06 DIAGNOSIS — E7800 Pure hypercholesterolemia, unspecified: Secondary | ICD-10-CM | POA: Diagnosis not present

## 2022-04-06 DIAGNOSIS — K219 Gastro-esophageal reflux disease without esophagitis: Secondary | ICD-10-CM | POA: Diagnosis not present

## 2022-04-11 DIAGNOSIS — C61 Malignant neoplasm of prostate: Secondary | ICD-10-CM | POA: Diagnosis not present

## 2022-04-11 DIAGNOSIS — Z23 Encounter for immunization: Secondary | ICD-10-CM | POA: Diagnosis not present

## 2022-04-11 DIAGNOSIS — E1169 Type 2 diabetes mellitus with other specified complication: Secondary | ICD-10-CM | POA: Diagnosis not present

## 2022-04-11 DIAGNOSIS — M170 Bilateral primary osteoarthritis of knee: Secondary | ICD-10-CM | POA: Diagnosis not present

## 2022-04-11 DIAGNOSIS — Z96651 Presence of right artificial knee joint: Secondary | ICD-10-CM | POA: Diagnosis not present

## 2022-04-11 DIAGNOSIS — N1831 Chronic kidney disease, stage 3a: Secondary | ICD-10-CM | POA: Diagnosis not present

## 2022-04-11 DIAGNOSIS — Z6830 Body mass index (BMI) 30.0-30.9, adult: Secondary | ICD-10-CM | POA: Diagnosis not present

## 2022-04-11 DIAGNOSIS — I10 Essential (primary) hypertension: Secondary | ICD-10-CM | POA: Diagnosis not present

## 2022-04-11 DIAGNOSIS — Z0001 Encounter for general adult medical examination with abnormal findings: Secondary | ICD-10-CM | POA: Diagnosis not present

## 2022-04-11 DIAGNOSIS — K5730 Diverticulosis of large intestine without perforation or abscess without bleeding: Secondary | ICD-10-CM | POA: Diagnosis not present

## 2022-04-11 DIAGNOSIS — E7849 Other hyperlipidemia: Secondary | ICD-10-CM | POA: Diagnosis not present

## 2022-04-11 DIAGNOSIS — M19011 Primary osteoarthritis, right shoulder: Secondary | ICD-10-CM | POA: Diagnosis not present

## 2022-04-12 DIAGNOSIS — I10 Essential (primary) hypertension: Secondary | ICD-10-CM | POA: Diagnosis not present

## 2022-04-12 DIAGNOSIS — N1831 Chronic kidney disease, stage 3a: Secondary | ICD-10-CM | POA: Diagnosis not present

## 2022-04-12 DIAGNOSIS — E7849 Other hyperlipidemia: Secondary | ICD-10-CM | POA: Diagnosis not present

## 2022-04-12 DIAGNOSIS — E1169 Type 2 diabetes mellitus with other specified complication: Secondary | ICD-10-CM | POA: Diagnosis not present

## 2022-04-12 DIAGNOSIS — Z0001 Encounter for general adult medical examination with abnormal findings: Secondary | ICD-10-CM | POA: Diagnosis not present

## 2022-04-12 DIAGNOSIS — F17210 Nicotine dependence, cigarettes, uncomplicated: Secondary | ICD-10-CM | POA: Diagnosis not present

## 2022-04-26 ENCOUNTER — Encounter: Payer: Self-pay | Admitting: Radiology

## 2022-10-03 DIAGNOSIS — E7849 Other hyperlipidemia: Secondary | ICD-10-CM | POA: Diagnosis not present

## 2022-10-03 DIAGNOSIS — I10 Essential (primary) hypertension: Secondary | ICD-10-CM | POA: Diagnosis not present

## 2022-10-03 DIAGNOSIS — E7801 Familial hypercholesterolemia: Secondary | ICD-10-CM | POA: Diagnosis not present

## 2022-10-03 DIAGNOSIS — N1831 Chronic kidney disease, stage 3a: Secondary | ICD-10-CM | POA: Diagnosis not present

## 2022-10-03 DIAGNOSIS — E1169 Type 2 diabetes mellitus with other specified complication: Secondary | ICD-10-CM | POA: Diagnosis not present

## 2022-10-03 DIAGNOSIS — E1165 Type 2 diabetes mellitus with hyperglycemia: Secondary | ICD-10-CM | POA: Diagnosis not present

## 2022-10-10 DIAGNOSIS — E1169 Type 2 diabetes mellitus with other specified complication: Secondary | ICD-10-CM | POA: Diagnosis not present

## 2022-10-10 DIAGNOSIS — N1831 Chronic kidney disease, stage 3a: Secondary | ICD-10-CM | POA: Diagnosis not present

## 2022-10-10 DIAGNOSIS — I10 Essential (primary) hypertension: Secondary | ICD-10-CM | POA: Diagnosis not present

## 2022-10-10 DIAGNOSIS — Z6829 Body mass index (BMI) 29.0-29.9, adult: Secondary | ICD-10-CM | POA: Diagnosis not present

## 2022-10-10 DIAGNOSIS — E7849 Other hyperlipidemia: Secondary | ICD-10-CM | POA: Diagnosis not present

## 2022-11-26 DIAGNOSIS — H35033 Hypertensive retinopathy, bilateral: Secondary | ICD-10-CM | POA: Diagnosis not present

## 2022-12-31 DIAGNOSIS — Z8546 Personal history of malignant neoplasm of prostate: Secondary | ICD-10-CM | POA: Diagnosis not present

## 2023-01-07 DIAGNOSIS — N390 Urinary tract infection, site not specified: Secondary | ICD-10-CM | POA: Diagnosis not present

## 2023-01-07 DIAGNOSIS — N3946 Mixed incontinence: Secondary | ICD-10-CM | POA: Diagnosis not present

## 2023-01-07 DIAGNOSIS — Z8546 Personal history of malignant neoplasm of prostate: Secondary | ICD-10-CM | POA: Diagnosis not present

## 2023-01-07 DIAGNOSIS — N5231 Erectile dysfunction following radical prostatectomy: Secondary | ICD-10-CM | POA: Diagnosis not present

## 2023-02-18 DIAGNOSIS — N390 Urinary tract infection, site not specified: Secondary | ICD-10-CM | POA: Diagnosis not present

## 2023-04-10 DIAGNOSIS — N1831 Chronic kidney disease, stage 3a: Secondary | ICD-10-CM | POA: Diagnosis not present

## 2023-04-10 DIAGNOSIS — E7849 Other hyperlipidemia: Secondary | ICD-10-CM | POA: Diagnosis not present

## 2023-04-10 DIAGNOSIS — Z131 Encounter for screening for diabetes mellitus: Secondary | ICD-10-CM | POA: Diagnosis not present

## 2023-04-10 DIAGNOSIS — E782 Mixed hyperlipidemia: Secondary | ICD-10-CM | POA: Diagnosis not present

## 2023-04-10 DIAGNOSIS — Z1329 Encounter for screening for other suspected endocrine disorder: Secondary | ICD-10-CM | POA: Diagnosis not present

## 2023-04-10 DIAGNOSIS — I10 Essential (primary) hypertension: Secondary | ICD-10-CM | POA: Diagnosis not present

## 2023-04-17 DIAGNOSIS — E1169 Type 2 diabetes mellitus with other specified complication: Secondary | ICD-10-CM | POA: Diagnosis not present

## 2023-04-17 DIAGNOSIS — I10 Essential (primary) hypertension: Secondary | ICD-10-CM | POA: Diagnosis not present

## 2023-04-17 DIAGNOSIS — D126 Benign neoplasm of colon, unspecified: Secondary | ICD-10-CM | POA: Diagnosis not present

## 2023-04-17 DIAGNOSIS — Z6830 Body mass index (BMI) 30.0-30.9, adult: Secondary | ICD-10-CM | POA: Diagnosis not present

## 2023-04-17 DIAGNOSIS — E782 Mixed hyperlipidemia: Secondary | ICD-10-CM | POA: Diagnosis not present

## 2023-04-17 DIAGNOSIS — Z0001 Encounter for general adult medical examination with abnormal findings: Secondary | ICD-10-CM | POA: Diagnosis not present

## 2023-05-16 ENCOUNTER — Ambulatory Visit: Admitting: Orthopedic Surgery

## 2023-05-24 ENCOUNTER — Other Ambulatory Visit (INDEPENDENT_AMBULATORY_CARE_PROVIDER_SITE_OTHER): Payer: Self-pay

## 2023-05-24 ENCOUNTER — Ambulatory Visit: Admitting: Orthopedic Surgery

## 2023-05-24 DIAGNOSIS — M25561 Pain in right knee: Secondary | ICD-10-CM | POA: Diagnosis not present

## 2023-05-24 DIAGNOSIS — M79604 Pain in right leg: Secondary | ICD-10-CM

## 2023-05-24 DIAGNOSIS — Z96651 Presence of right artificial knee joint: Secondary | ICD-10-CM

## 2023-05-24 NOTE — Progress Notes (Signed)
  Subjective:     Patient ID: Robert Bishop, male   DOB: 11-02-1945, 78 y.o.   MRN: [id number]  78 yo male s/p rt tka 07/2020 Presents with pain in his right leg.  The patient says he was in his normal state of health he was functioning normally started having pain in his right knee radiating up into his right hip  It spontaneously resolved  He does want to the knee check for popping  Currently no pain or symptoms patient resume all normal activity  Knee Pain      Review of Systems  Constitutional:  Negative for fever.  Skin: Negative.        Objective:   Physical Exam Vitals and nursing note reviewed.  Constitutional:      Appearance: Normal appearance.  HENT:     Head: Normocephalic and atraumatic.  Eyes:     General: No scleral icterus.       Right eye: No discharge.        Left eye: No discharge.     Extraocular Movements: Extraocular movements intact.     Conjunctiva/sclera: Conjunctivae normal.     Pupils: Pupils are equal, round, and reactive to light.  Cardiovascular:     Rate and Rhythm: Normal rate.     Pulses: Normal pulses.  Musculoskeletal:        General: No swelling, tenderness or deformity. Normal range of motion.     Right lower leg: No edema.  Skin:    General: Skin is warm and dry.     Capillary Refill: Capillary refill takes less than 2 seconds.  Neurological:     General: No focal deficit present.     Mental Status: He is alert and oriented to person, place, and time.     Gait: Gait normal.  Psychiatric:        Mood and Affect: Mood normal.        Behavior: Behavior normal.        Thought Content: Thought content normal.        Judgment: Judgment normal.    DG Knee AP/LAT W/Sunrise Right Result Date: 05/24/2023 Right total knee Image Patient fell complains of popping and pain in his right knee Alignment normal.  No loosening.  Patella alignment normal Normal right total knee No abnormality seen        Assessment:     Encounter  Diagnoses  Name Primary?   S/P TKR (total knee replacement), right 08/16/20    Pain in right leg Yes       Plan:     No concerns at this time.  Patient advised to come back and see [place] if things act up again

## 2023-05-24 NOTE — Progress Notes (Signed)
   There were no vitals taken for this visit.  There is no height or weight on file to calculate BMI.  Chief Complaint  Patient presents with   Knee Pain    Right     Encounter Diagnosis  Name Primary?   S/P TKR (total knee replacement), right 08/16/20 Yes    DOI/DOS/ 1 week       Increased pain and now popping in knee since he fell

## 2023-05-27 DIAGNOSIS — Z09 Encounter for follow-up examination after completed treatment for conditions other than malignant neoplasm: Secondary | ICD-10-CM | POA: Diagnosis not present

## 2023-05-27 DIAGNOSIS — Z860100 Personal history of colon polyps, unspecified: Secondary | ICD-10-CM | POA: Diagnosis not present

## 2023-05-28 DIAGNOSIS — Z860100 Personal history of colon polyps, unspecified: Secondary | ICD-10-CM | POA: Insufficient documentation

## 2023-06-27 DIAGNOSIS — K649 Unspecified hemorrhoids: Secondary | ICD-10-CM | POA: Diagnosis not present

## 2023-06-27 DIAGNOSIS — Z79899 Other long term (current) drug therapy: Secondary | ICD-10-CM | POA: Diagnosis not present

## 2023-06-27 DIAGNOSIS — K641 Second degree hemorrhoids: Secondary | ICD-10-CM | POA: Diagnosis not present

## 2023-06-27 DIAGNOSIS — Z1211 Encounter for screening for malignant neoplasm of colon: Secondary | ICD-10-CM | POA: Diagnosis not present

## 2023-06-27 DIAGNOSIS — Z87891 Personal history of nicotine dependence: Secondary | ICD-10-CM | POA: Diagnosis not present

## 2023-06-27 DIAGNOSIS — Z7982 Long term (current) use of aspirin: Secondary | ICD-10-CM | POA: Diagnosis not present

## 2023-06-27 DIAGNOSIS — Z7984 Long term (current) use of oral hypoglycemic drugs: Secondary | ICD-10-CM | POA: Diagnosis not present

## 2023-06-27 DIAGNOSIS — E119 Type 2 diabetes mellitus without complications: Secondary | ICD-10-CM | POA: Diagnosis not present

## 2023-06-27 DIAGNOSIS — E785 Hyperlipidemia, unspecified: Secondary | ICD-10-CM | POA: Diagnosis not present

## 2023-06-27 DIAGNOSIS — Z860101 Personal history of adenomatous and serrated colon polyps: Secondary | ICD-10-CM | POA: Diagnosis not present

## 2023-06-27 DIAGNOSIS — K635 Polyp of colon: Secondary | ICD-10-CM | POA: Diagnosis not present

## 2023-06-27 DIAGNOSIS — D122 Benign neoplasm of ascending colon: Secondary | ICD-10-CM | POA: Diagnosis not present

## 2023-08-07 ENCOUNTER — Encounter: Payer: Self-pay | Admitting: Orthopedic Surgery

## 2023-08-08 ENCOUNTER — Ambulatory Visit: Payer: Medicare Other | Admitting: Orthopedic Surgery

## 2023-10-07 DIAGNOSIS — Z0001 Encounter for general adult medical examination with abnormal findings: Secondary | ICD-10-CM | POA: Diagnosis not present

## 2023-10-07 DIAGNOSIS — E7849 Other hyperlipidemia: Secondary | ICD-10-CM | POA: Diagnosis not present

## 2023-10-07 DIAGNOSIS — I10 Essential (primary) hypertension: Secondary | ICD-10-CM | POA: Diagnosis not present

## 2023-10-07 DIAGNOSIS — E1169 Type 2 diabetes mellitus with other specified complication: Secondary | ICD-10-CM | POA: Diagnosis not present

## 2023-10-07 DIAGNOSIS — N1831 Chronic kidney disease, stage 3a: Secondary | ICD-10-CM | POA: Diagnosis not present

## 2023-10-16 DIAGNOSIS — E1169 Type 2 diabetes mellitus with other specified complication: Secondary | ICD-10-CM | POA: Diagnosis not present

## 2023-10-16 DIAGNOSIS — Z6827 Body mass index (BMI) 27.0-27.9, adult: Secondary | ICD-10-CM | POA: Diagnosis not present

## 2023-10-16 DIAGNOSIS — E782 Mixed hyperlipidemia: Secondary | ICD-10-CM | POA: Diagnosis not present

## 2023-10-16 DIAGNOSIS — I10 Essential (primary) hypertension: Secondary | ICD-10-CM | POA: Diagnosis not present

## 2023-10-16 DIAGNOSIS — D126 Benign neoplasm of colon, unspecified: Secondary | ICD-10-CM | POA: Diagnosis not present

## 2023-12-19 DIAGNOSIS — Z8546 Personal history of malignant neoplasm of prostate: Secondary | ICD-10-CM | POA: Diagnosis not present

## 2023-12-24 DIAGNOSIS — N5231 Erectile dysfunction following radical prostatectomy: Secondary | ICD-10-CM | POA: Diagnosis not present

## 2023-12-24 DIAGNOSIS — Z8546 Personal history of malignant neoplasm of prostate: Secondary | ICD-10-CM | POA: Diagnosis not present

## 2023-12-24 DIAGNOSIS — Z87440 Personal history of urinary (tract) infections: Secondary | ICD-10-CM | POA: Diagnosis not present

## 2023-12-24 DIAGNOSIS — N393 Stress incontinence (female) (male): Secondary | ICD-10-CM | POA: Diagnosis not present
